# Patient Record
Sex: Female | Born: 1945 | Hispanic: No
Health system: Southern US, Community
[De-identification: ages and names within clinical notes are randomized; demographics above are authoritative.]

## PROBLEM LIST (undated history)

## (undated) DIAGNOSIS — L409 Psoriasis, unspecified: Secondary | ICD-10-CM

## (undated) DIAGNOSIS — M65331 Trigger finger, right middle finger: Secondary | ICD-10-CM

## (undated) DIAGNOSIS — Z91048 Other nonmedicinal substance allergy status: Secondary | ICD-10-CM

## (undated) DIAGNOSIS — E7800 Pure hypercholesterolemia, unspecified: Secondary | ICD-10-CM

---

## 1978-10-21 HISTORY — PX: TUBAL LIGATION: SHX77

## 2016-07-22 ENCOUNTER — Ambulatory Visit (INDEPENDENT_AMBULATORY_CARE_PROVIDER_SITE_OTHER): Payer: Medicare HMO | Admitting: Orthopaedic Surgery

## 2016-07-22 DIAGNOSIS — M65332 Trigger finger, left middle finger: Secondary | ICD-10-CM | POA: Diagnosis not present

## 2016-12-03 ENCOUNTER — Telehealth (INDEPENDENT_AMBULATORY_CARE_PROVIDER_SITE_OTHER): Payer: Self-pay | Admitting: Radiology

## 2016-12-03 NOTE — Telephone Encounter (Signed)
Patient left voicemail requesting CD of images.  CD has been made and placed up front.  Patient is aware CD is ready for pick up and states she has already filled out the release form.

## 2019-11-03 ENCOUNTER — Ambulatory Visit (INDEPENDENT_AMBULATORY_CARE_PROVIDER_SITE_OTHER): Payer: Medicare HMO | Admitting: Psychology

## 2019-11-03 DIAGNOSIS — F411 Generalized anxiety disorder: Secondary | ICD-10-CM

## 2019-11-17 ENCOUNTER — Ambulatory Visit (INDEPENDENT_AMBULATORY_CARE_PROVIDER_SITE_OTHER): Payer: Medicare HMO | Admitting: Psychology

## 2019-11-17 DIAGNOSIS — F411 Generalized anxiety disorder: Secondary | ICD-10-CM

## 2019-11-29 ENCOUNTER — Ambulatory Visit: Payer: Medicare HMO | Attending: Internal Medicine

## 2019-11-29 ENCOUNTER — Ambulatory Visit (INDEPENDENT_AMBULATORY_CARE_PROVIDER_SITE_OTHER): Payer: Medicare HMO | Admitting: Psychology

## 2019-11-29 DIAGNOSIS — Z23 Encounter for immunization: Secondary | ICD-10-CM | POA: Insufficient documentation

## 2019-11-29 DIAGNOSIS — F411 Generalized anxiety disorder: Secondary | ICD-10-CM

## 2019-11-29 NOTE — Progress Notes (Signed)
   Covid-19 Vaccination Clinic  Name:  Jordan Collier    MRN: [id number] DOB: 01-Oct-1946  11/29/2019  Jordan Collier was observed post Covid-19 immunization for 15 minutes without incidence. She was provided with Vaccine Information Sheet and instruction to access the V-Safe system.   Jordan Collier was instructed to call 911 with any severe reactions post vaccine: Marland Kitchen Difficulty breathing  . Swelling of your face and throat  . A fast heartbeat  . A bad rash all over your body  . Dizziness and weakness    Immunizations Administered    Name Date Dose VIS Date Route   Pfizer COVID-19 Vaccine 11/29/2019  5:07 PM 0.3 mL 10/01/2019 Intramuscular   Manufacturer: [place]   Lot: [id number]   [place]: [id number]

## 2019-12-01 ENCOUNTER — Ambulatory Visit: Payer: Medicare HMO | Admitting: Psychology

## 2019-12-24 ENCOUNTER — Ambulatory Visit: Payer: Medicare HMO | Attending: Internal Medicine

## 2019-12-24 DIAGNOSIS — Z23 Encounter for immunization: Secondary | ICD-10-CM

## 2019-12-24 NOTE — Progress Notes (Signed)
   Covid-19 Vaccination Clinic  Name:  Sheree Nagi    MRN: [id number] DOB: 06-10-1946  12/24/2019  Ms. Vallin was observed post Covid-19 immunization for 15 minutes without incident. She was provided with Vaccine Information Sheet and instruction to access the V-Safe system.   Ms. Reinholtz was instructed to call 911 with any severe reactions post vaccine: Marland Kitchen Difficulty breathing  . Swelling of face and throat  . A fast heartbeat  . A bad rash all over body  . Dizziness and weakness   Immunizations Administered    Name Date Dose VIS Date Route   Pfizer COVID-19 Vaccine 12/24/2019  3:06 PM 0.3 mL 10/01/2019 Intramuscular   Manufacturer: [place]   Lot: [id number]   [place]: [id number]

## 2020-01-05 ENCOUNTER — Ambulatory Visit (INDEPENDENT_AMBULATORY_CARE_PROVIDER_SITE_OTHER): Payer: Medicare HMO | Admitting: Psychology

## 2020-01-05 DIAGNOSIS — F411 Generalized anxiety disorder: Secondary | ICD-10-CM

## 2020-01-19 ENCOUNTER — Ambulatory Visit (INDEPENDENT_AMBULATORY_CARE_PROVIDER_SITE_OTHER): Payer: Medicare HMO | Admitting: Psychology

## 2020-01-19 DIAGNOSIS — F411 Generalized anxiety disorder: Secondary | ICD-10-CM | POA: Diagnosis not present

## 2020-02-01 ENCOUNTER — Ambulatory Visit (INDEPENDENT_AMBULATORY_CARE_PROVIDER_SITE_OTHER): Payer: Medicare HMO | Admitting: Psychology

## 2020-02-01 DIAGNOSIS — F411 Generalized anxiety disorder: Secondary | ICD-10-CM | POA: Diagnosis not present

## 2020-02-17 ENCOUNTER — Ambulatory Visit: Payer: Medicare HMO | Admitting: Psychology

## 2020-09-13 ENCOUNTER — Other Ambulatory Visit: Payer: Self-pay | Admitting: Internal Medicine

## 2020-09-13 DIAGNOSIS — Z1231 Encounter for screening mammogram for malignant neoplasm of breast: Secondary | ICD-10-CM

## 2020-11-13 ENCOUNTER — Ambulatory Visit: Payer: Medicare HMO

## 2021-01-22 ENCOUNTER — Ambulatory Visit
Admission: RE | Admit: 2021-01-22 | Discharge: 2021-01-22 | Disposition: A | Discharge status: Home / Self Care | Payer: Medicare HMO | Source: Ambulatory Visit | Attending: Internal Medicine | Admitting: Internal Medicine

## 2021-01-22 ENCOUNTER — Other Ambulatory Visit: Payer: Self-pay

## 2021-01-22 DIAGNOSIS — Z1231 Encounter for screening mammogram for malignant neoplasm of breast: Secondary | ICD-10-CM | POA: Diagnosis not present

## 2021-03-05 DIAGNOSIS — Z1331 Encounter for screening for depression: Secondary | ICD-10-CM | POA: Diagnosis not present

## 2021-03-05 DIAGNOSIS — E7800 Pure hypercholesterolemia, unspecified: Secondary | ICD-10-CM | POA: Diagnosis not present

## 2021-03-05 DIAGNOSIS — R69 Illness, unspecified: Secondary | ICD-10-CM | POA: Diagnosis not present

## 2021-03-05 DIAGNOSIS — Z1339 Encounter for screening examination for other mental health and behavioral disorders: Secondary | ICD-10-CM | POA: Diagnosis not present

## 2021-03-05 DIAGNOSIS — L409 Psoriasis, unspecified: Secondary | ICD-10-CM | POA: Diagnosis not present

## 2021-03-05 DIAGNOSIS — R7301 Impaired fasting glucose: Secondary | ICD-10-CM | POA: Diagnosis not present

## 2021-03-05 DIAGNOSIS — K056 Periodontal disease, unspecified: Secondary | ICD-10-CM | POA: Diagnosis not present

## 2021-03-05 DIAGNOSIS — F4320 Adjustment disorder, unspecified: Secondary | ICD-10-CM | POA: Diagnosis not present

## 2021-03-05 DIAGNOSIS — Z86010 Personal history of colonic polyps: Secondary | ICD-10-CM | POA: Diagnosis not present

## 2021-03-05 DIAGNOSIS — L439 Lichen planus, unspecified: Secondary | ICD-10-CM | POA: Diagnosis not present

## 2021-03-05 DIAGNOSIS — I7300 Raynaud's syndrome without gangrene: Secondary | ICD-10-CM | POA: Diagnosis not present

## 2021-03-12 DIAGNOSIS — H5203 Hypermetropia, bilateral: Secondary | ICD-10-CM | POA: Diagnosis not present

## 2021-07-12 DIAGNOSIS — Z86010 Personal history of colonic polyps: Secondary | ICD-10-CM | POA: Diagnosis not present

## 2021-07-12 DIAGNOSIS — K5730 Diverticulosis of large intestine without perforation or abscess without bleeding: Secondary | ICD-10-CM | POA: Diagnosis not present

## 2021-07-12 DIAGNOSIS — D122 Benign neoplasm of ascending colon: Secondary | ICD-10-CM | POA: Diagnosis not present

## 2021-07-12 DIAGNOSIS — K648 Other hemorrhoids: Secondary | ICD-10-CM | POA: Diagnosis not present

## 2021-07-16 DIAGNOSIS — Z0100 Encounter for examination of eyes and vision without abnormal findings: Secondary | ICD-10-CM | POA: Diagnosis not present

## 2021-07-17 DIAGNOSIS — D122 Benign neoplasm of ascending colon: Secondary | ICD-10-CM | POA: Diagnosis not present

## 2021-08-20 DIAGNOSIS — Z111 Encounter for screening for respiratory tuberculosis: Secondary | ICD-10-CM | POA: Diagnosis not present

## 2021-08-22 DIAGNOSIS — Z111 Encounter for screening for respiratory tuberculosis: Secondary | ICD-10-CM | POA: Diagnosis not present

## 2021-08-27 DIAGNOSIS — E785 Hyperlipidemia, unspecified: Secondary | ICD-10-CM | POA: Diagnosis not present

## 2021-08-27 DIAGNOSIS — R7301 Impaired fasting glucose: Secondary | ICD-10-CM | POA: Diagnosis not present

## 2021-09-03 DIAGNOSIS — Z86010 Personal history of colonic polyps: Secondary | ICD-10-CM | POA: Diagnosis not present

## 2021-09-03 DIAGNOSIS — R7301 Impaired fasting glucose: Secondary | ICD-10-CM | POA: Diagnosis not present

## 2021-09-03 DIAGNOSIS — I7300 Raynaud's syndrome without gangrene: Secondary | ICD-10-CM | POA: Diagnosis not present

## 2021-09-03 DIAGNOSIS — R82998 Other abnormal findings in urine: Secondary | ICD-10-CM | POA: Diagnosis not present

## 2021-09-03 DIAGNOSIS — R69 Illness, unspecified: Secondary | ICD-10-CM | POA: Diagnosis not present

## 2021-09-03 DIAGNOSIS — E7800 Pure hypercholesterolemia, unspecified: Secondary | ICD-10-CM | POA: Diagnosis not present

## 2021-09-03 DIAGNOSIS — E871 Hypo-osmolality and hyponatremia: Secondary | ICD-10-CM | POA: Diagnosis not present

## 2021-09-03 DIAGNOSIS — Z1331 Encounter for screening for depression: Secondary | ICD-10-CM | POA: Diagnosis not present

## 2021-09-03 DIAGNOSIS — Z0000 Encounter for general adult medical examination without abnormal findings: Secondary | ICD-10-CM | POA: Diagnosis not present

## 2021-09-03 DIAGNOSIS — Z1339 Encounter for screening examination for other mental health and behavioral disorders: Secondary | ICD-10-CM | POA: Diagnosis not present

## 2021-09-03 DIAGNOSIS — F4320 Adjustment disorder, unspecified: Secondary | ICD-10-CM | POA: Diagnosis not present

## 2021-09-03 DIAGNOSIS — L439 Lichen planus, unspecified: Secondary | ICD-10-CM | POA: Diagnosis not present

## 2021-11-15 DIAGNOSIS — R0989 Other specified symptoms and signs involving the circulatory and respiratory systems: Secondary | ICD-10-CM | POA: Diagnosis not present

## 2021-11-15 DIAGNOSIS — F430 Acute stress reaction: Secondary | ICD-10-CM | POA: Diagnosis not present

## 2021-11-15 DIAGNOSIS — R69 Illness, unspecified: Secondary | ICD-10-CM | POA: Diagnosis not present

## 2022-02-14 DIAGNOSIS — M722 Plantar fascial fibromatosis: Secondary | ICD-10-CM | POA: Diagnosis not present

## 2022-02-14 DIAGNOSIS — M7661 Achilles tendinitis, right leg: Secondary | ICD-10-CM | POA: Diagnosis not present

## 2022-02-14 DIAGNOSIS — M6702 Short Achilles tendon (acquired), left ankle: Secondary | ICD-10-CM | POA: Diagnosis not present

## 2022-02-14 DIAGNOSIS — M7671 Peroneal tendinitis, right leg: Secondary | ICD-10-CM | POA: Diagnosis not present

## 2022-02-14 DIAGNOSIS — M6701 Short Achilles tendon (acquired), right ankle: Secondary | ICD-10-CM | POA: Diagnosis not present

## 2022-03-04 DIAGNOSIS — R7301 Impaired fasting glucose: Secondary | ICD-10-CM | POA: Diagnosis not present

## 2022-03-04 DIAGNOSIS — L439 Lichen planus, unspecified: Secondary | ICD-10-CM | POA: Diagnosis not present

## 2022-03-04 DIAGNOSIS — K056 Periodontal disease, unspecified: Secondary | ICD-10-CM | POA: Diagnosis not present

## 2022-03-04 DIAGNOSIS — L409 Psoriasis, unspecified: Secondary | ICD-10-CM | POA: Diagnosis not present

## 2022-03-04 DIAGNOSIS — R69 Illness, unspecified: Secondary | ICD-10-CM | POA: Diagnosis not present

## 2022-03-04 DIAGNOSIS — Z1331 Encounter for screening for depression: Secondary | ICD-10-CM | POA: Diagnosis not present

## 2022-03-04 DIAGNOSIS — R0989 Other specified symptoms and signs involving the circulatory and respiratory systems: Secondary | ICD-10-CM | POA: Diagnosis not present

## 2022-03-04 DIAGNOSIS — Z1339 Encounter for screening examination for other mental health and behavioral disorders: Secondary | ICD-10-CM | POA: Diagnosis not present

## 2022-03-04 DIAGNOSIS — I7300 Raynaud's syndrome without gangrene: Secondary | ICD-10-CM | POA: Diagnosis not present

## 2022-03-04 DIAGNOSIS — E7800 Pure hypercholesterolemia, unspecified: Secondary | ICD-10-CM | POA: Diagnosis not present

## 2022-03-04 DIAGNOSIS — F430 Acute stress reaction: Secondary | ICD-10-CM | POA: Diagnosis not present

## 2022-03-04 DIAGNOSIS — F4320 Adjustment disorder, unspecified: Secondary | ICD-10-CM | POA: Diagnosis not present

## 2022-04-01 DIAGNOSIS — H5203 Hypermetropia, bilateral: Secondary | ICD-10-CM | POA: Diagnosis not present

## 2022-04-02 DIAGNOSIS — M722 Plantar fascial fibromatosis: Secondary | ICD-10-CM | POA: Diagnosis not present

## 2022-05-16 DIAGNOSIS — M722 Plantar fascial fibromatosis: Secondary | ICD-10-CM | POA: Diagnosis not present

## 2022-05-16 DIAGNOSIS — M6702 Short Achilles tendon (acquired), left ankle: Secondary | ICD-10-CM | POA: Diagnosis not present

## 2022-05-16 DIAGNOSIS — M6701 Short Achilles tendon (acquired), right ankle: Secondary | ICD-10-CM | POA: Diagnosis not present

## 2022-05-16 DIAGNOSIS — M7671 Peroneal tendinitis, right leg: Secondary | ICD-10-CM | POA: Diagnosis not present

## 2022-05-16 DIAGNOSIS — M7661 Achilles tendinitis, right leg: Secondary | ICD-10-CM | POA: Diagnosis not present

## 2022-08-15 ENCOUNTER — Ambulatory Visit: Payer: Medicare HMO | Admitting: Podiatry

## 2022-08-15 ENCOUNTER — Ambulatory Visit (INDEPENDENT_AMBULATORY_CARE_PROVIDER_SITE_OTHER): Payer: Medicare HMO

## 2022-08-15 DIAGNOSIS — M79672 Pain in left foot: Secondary | ICD-10-CM | POA: Diagnosis not present

## 2022-08-15 DIAGNOSIS — M9261 Juvenile osteochondrosis of tarsus, right ankle: Secondary | ICD-10-CM

## 2022-08-15 DIAGNOSIS — M7661 Achilles tendinitis, right leg: Secondary | ICD-10-CM

## 2022-08-15 DIAGNOSIS — M79671 Pain in right foot: Secondary | ICD-10-CM

## 2022-08-15 DIAGNOSIS — M19079 Primary osteoarthritis, unspecified ankle and foot: Secondary | ICD-10-CM

## 2022-08-15 DIAGNOSIS — M779 Enthesopathy, unspecified: Secondary | ICD-10-CM

## 2022-08-15 NOTE — Patient Instructions (Addendum)
For sandals I like oofos and vionic  ---   Plantar Fasciitis (Heel Spur Syndrome) with Rehab The plantar fascia is a fibrous, ligament-like, soft-tissue structure that spans the bottom of the foot. Plantar fasciitis is a condition that causes pain in the foot due to inflammation of the tissue. SYMPTOMS  Pain and tenderness on the underneath side of the foot. Pain that worsens with standing or walking. CAUSES  Plantar fasciitis is caused by irritation and injury to the plantar fascia on the underneath side of the foot. Common mechanisms of injury include: Direct trauma to bottom of the foot. Damage to a small nerve that runs under the foot where the main fascia attaches to the heel bone. Stress placed on the plantar fascia due to bone spurs. RISK INCREASES WITH:  Activities that place stress on the plantar fascia (running, jumping, pivoting, or cutting). Poor strength and flexibility. Improperly fitted shoes. Tight calf muscles. Flat feet. Failure to warm-up properly before activity. Obesity. PREVENTION Warm up and stretch properly before activity. Allow for adequate recovery between workouts. Maintain physical fitness: Strength, flexibility, and endurance. Cardiovascular fitness. Maintain a health body weight. Avoid stress on the plantar fascia. Wear properly fitted shoes, including arch supports for individuals who have flat feet.  PROGNOSIS  If treated properly, then the symptoms of plantar fasciitis usually resolve without surgery. However, occasionally surgery is necessary.  RELATED COMPLICATIONS  Recurrent symptoms that may result in a chronic condition. Problems of the lower back that are caused by compensating for the injury, such as limping. Pain or weakness of the foot during push-off following surgery. Chronic inflammation, scarring, and partial or complete fascia tear, occurring more often from repeated injections.  TREATMENT  Treatment initially involves the use  of ice and medication to help reduce pain and inflammation. The use of strengthening and stretching exercises may help reduce pain with activity, especially stretches of the Achilles tendon. These exercises may be performed at home or with a therapist. Your caregiver may recommend that you use heel cups of arch supports to help reduce stress on the plantar fascia. Occasionally, corticosteroid injections are given to reduce inflammation. If symptoms persist for greater than 6 months despite non-surgical (conservative), then surgery may be recommended.   MEDICATION  If pain medication is necessary, then nonsteroidal anti-inflammatory medications, such as aspirin and ibuprofen, or other minor pain relievers, such as acetaminophen, are often recommended. Do not take pain medication within 7 days before surgery. Prescription pain relievers may be given if deemed necessary by your caregiver. Use only as directed and only as much as you need. Corticosteroid injections may be given by your caregiver. These injections should be reserved for the most serious cases, because they may only be given a certain number of times.  HEAT AND COLD Cold treatment (icing) relieves pain and reduces inflammation. Cold treatment should be applied for 10 to 15 minutes every 2 to 3 hours for inflammation and pain and immediately after any activity that aggravates your symptoms. Use ice packs or massage the area with a piece of ice (ice massage). Heat treatment may be used prior to performing the stretching and strengthening activities prescribed by your caregiver, physical therapist, or athletic trainer. Use a heat pack or soak the injury in warm water.  SEEK IMMEDIATE MEDICAL CARE IF: Treatment seems to offer no benefit, or the condition worsens. Any medications produce adverse side effects.  EXERCISES- RANGE OF MOTION (ROM) AND STRETCHING EXERCISES - Plantar Fasciitis (Heel Spur Syndrome) These exercises  may help you when  beginning to rehabilitate your injury. Your symptoms may resolve with or without further involvement from your physician, physical therapist or athletic trainer. While completing these exercises, remember:  Restoring tissue flexibility helps normal motion to return to the joints. This allows healthier, less painful movement and activity. An effective stretch should be held for at least 30 seconds. A stretch should never be painful. You should only feel a gentle lengthening or release in the stretched tissue.  RANGE OF MOTION - Toe Extension, Flexion Sit with your right / left leg crossed over your opposite knee. Grasp your toes and gently pull them back toward the top of your foot. You should feel a stretch on the bottom of your toes and/or foot. Hold this stretch for 10 seconds. Now, gently pull your toes toward the bottom of your foot. You should feel a stretch on the top of your toes and or foot. Hold this stretch for 10 seconds. Repeat  times. Complete this stretch 3 times per day.   RANGE OF MOTION - Ankle Dorsiflexion, Active Assisted Remove shoes and sit on a chair that is preferably not on a carpeted surface. Place right / left foot under knee. Extend your opposite leg for support. Keeping your heel down, slide your right / left foot back toward the chair until you feel a stretch at your ankle or calf. If you do not feel a stretch, slide your bottom forward to the edge of the chair, while still keeping your heel down. Hold this stretch for 10 seconds. Repeat 3 times. Complete this stretch 2 times per day.   STRETCH  Gastroc, Standing Place hands on wall. Extend right / left leg, keeping the front knee somewhat bent. Slightly point your toes inward on your back foot. Keeping your right / left heel on the floor and your knee straight, shift your weight toward the wall, not allowing your back to arch. You should feel a gentle stretch in the right / left calf. Hold this position for 10  seconds. Repeat 3 times. Complete this stretch 2 times per day.  STRETCH  Soleus, Standing Place hands on wall. Extend right / left leg, keeping the other knee somewhat bent. Slightly point your toes inward on your back foot. Keep your right / left heel on the floor, bend your back knee, and slightly shift your weight over the back leg so that you feel a gentle stretch deep in your back calf. Hold this position for 10 seconds. Repeat 3 times. Complete this stretch 2 times per day.  STRETCH  Gastrocsoleus, Standing  Note: This exercise can place a lot of stress on your foot and ankle. Please complete this exercise only if specifically instructed by your caregiver.  Place the ball of your right / left foot on a step, keeping your other foot firmly on the same step. Hold on to the wall or a rail for balance. Slowly lift your other foot, allowing your body weight to press your heel down over the edge of the step. You should feel a stretch in your right / left calf. Hold this position for 10 seconds. Repeat this exercise with a slight bend in your right / left knee. Repeat 3 times. Complete this stretch 2 times per day.   STRENGTHENING EXERCISES - Plantar Fasciitis (Heel Spur Syndrome)  These exercises may help you when beginning to rehabilitate your injury. They may resolve your symptoms with or without further involvement from your physician, physical therapist  or Product/process development scientist. While completing these exercises, remember:  Muscles can gain both the endurance and the strength needed for everyday activities through controlled exercises. Complete these exercises as instructed by your physician, physical therapist or athletic trainer. Progress the resistance and repetitions only as guided.  STRENGTH - Towel Curls Sit in a chair positioned on a non-carpeted surface. Place your foot on a towel, keeping your heel on the floor. Pull the towel toward your heel by only curling your toes. Keep your  heel on the floor. Repeat 3 times. Complete this exercise 2 times per day.  STRENGTH - Ankle Inversion Secure one end of a rubber exercise band/tubing to a fixed object (table, pole). Loop the other end around your foot just before your toes. Place your fists between your knees. This will focus your strengthening at your ankle. Slowly, pull your big toe up and in, making sure the band/tubing is positioned to resist the entire motion. Hold this position for 10 seconds. Have your muscles resist the band/tubing as it slowly pulls your foot back to the starting position. Repeat 3 times. Complete this exercises 2 times per day.  Document Released: 10/07/2005 Document Revised: 12/30/2011 Document Reviewed: 01/19/2009 [facility] Patient Information 2014 [place], [place].

## 2022-08-15 NOTE — Progress Notes (Signed)
Subjective:   Patient ID: Jordan Collier, female   DOB: 76 y.o.   MRN: [id number]   HPI Chief Complaint  Patient presents with   Foot Pain    right achilles tendon pain, in the heel and on the lateral side of the foot, patient is having some swelling, Started  7 months ago, Rate of pain 8 out of 10 at night time, TX: freeze rub with hemp, inserts, changing shoes, LEFT FOOT BUNION    76 year old female presents the above concerns.  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 She was at [employer] and she went to put something on the shelf and she had some pain from the bottom of the heel going to the back of the heel.  At that time she did not feel any popping sensations or any injury.  She states the pain is variable. Was getting more pain at night with a pain level of 8/10 and 2/10 during the day. She has inserts and asking for new ones. She has 3 pairs of inserts and 4 pairs of she she rotates. She is asking about shoe recommends for at home.   The more constant pain has been about 7 months but it has been going on longer.   She has a bunion on the left foot as well that she is asking about but causing significant pain.   Review of Systems  All other systems reviewed and are negative.  No past medical history on file.   Current Outpatient Medications:    cetirizine (ZYRTEC) 10 MG tablet, 1 tablet, Disp: , Rfl:    Cholecalciferol (KP VITAMIN D) 25 MCG (1000 UT) capsule, Take by mouth., Disp: , Rfl:    Cyanocobalamin 1000 MCG/15ML LIQD, See admin instructions., Disp: , Rfl:    guaiFENesin (MUCINEX) 600 MG 12 hr tablet, Take by mouth., Disp: , Rfl:    sodium chloride (OCEAN) 0.65 % nasal spray, 2 sprays in each nostril as needed, Disp: , Rfl:    valACYclovir (VALTREX) 1000 MG tablet, Take by mouth., Disp: , Rfl:   Allergies  Allergen Reactions   Dexamethasone Rash   Molds & Smuts Other (See Comments)    Mucus over production   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 Other (See Comments)     Mucus over production   Penicillins     Other reaction(s): Unknown   Sulfa Antibiotics     Other reaction(s): Unknown          Objective:  Physical Exam  General: AAO x3, NAD  Dermatological: Skin is warm, dry and supple bilateral.  There are no open sores, no preulcerative lesions, no rash or signs of infection present.  Vascular: Dorsalis Pedis artery and Posterior Tibial artery pedal pulses are 2/4 bilateral with immedate capillary fill time.  There is no pain with calf compression, swelling, warmth, erythema.   Neruologic: Grossly intact via light touch bilateral.  Negative Tinel sign.  Musculoskeletal: Tenderness palpation of the posterior aspect the calcaneal insertion of the Achilles tendon but also to the bottom of the heel as well.  There is no pain with lateral compression of the calcaneus.  There is no edema.  Equinus is present.  Flexor, extensor tendons appear to be intact bilaterally.  Decreased range of motion of the left first MPJ and there is crepitation with range of motion.  Gait: Unassisted, Nonantalgic.       Assessment:   76 year old female with right-sided heel pain, first MPJ arthritis     Plan:  -  Treatment options discussed including all alternatives, risks, and complications -Etiology of symptoms were discussed -X-rays were obtained and reviewed with the patient.  3 views bilateral feet and 2 views of the right ankle were obtained.  There is no evidence of acute fracture.  Haglund's deformity is noted.  Arthritic changes present of the left first MPJ. -Regards to the right side we discussed continued stretching, icing on a regular basis.  Discussed shoes and good arch supports.  Dispensed a pair of power steps were dispensed and added to the lateral post to mimic her other inserts.  Discussed Voltaren gel as well. -Regards to the left side we discussed using good arch support as well as wearing a stiffer soled shoe.  Consider injection if needed but  she can also use Voltaren gel.  Trula Slade DPM

## 2022-08-19 DIAGNOSIS — Z0100 Encounter for examination of eyes and vision without abnormal findings: Secondary | ICD-10-CM | POA: Diagnosis not present

## 2022-08-22 ENCOUNTER — Other Ambulatory Visit: Payer: Self-pay | Admitting: Podiatry

## 2022-08-22 DIAGNOSIS — M9261 Juvenile osteochondrosis of tarsus, right ankle: Secondary | ICD-10-CM

## 2022-08-22 DIAGNOSIS — M79671 Pain in right foot: Secondary | ICD-10-CM

## 2022-08-22 DIAGNOSIS — M7661 Achilles tendinitis, right leg: Secondary | ICD-10-CM

## 2022-08-22 DIAGNOSIS — M19079 Primary osteoarthritis, unspecified ankle and foot: Secondary | ICD-10-CM

## 2022-08-26 DIAGNOSIS — H0011 Chalazion right upper eyelid: Secondary | ICD-10-CM | POA: Diagnosis not present

## 2022-09-05 DIAGNOSIS — Z961 Presence of intraocular lens: Secondary | ICD-10-CM | POA: Diagnosis not present

## 2022-09-05 DIAGNOSIS — H18513 Endothelial corneal dystrophy, bilateral: Secondary | ICD-10-CM | POA: Diagnosis not present

## 2022-09-05 DIAGNOSIS — H00021 Hordeolum internum right upper eyelid: Secondary | ICD-10-CM | POA: Diagnosis not present

## 2022-09-16 DIAGNOSIS — R7301 Impaired fasting glucose: Secondary | ICD-10-CM | POA: Diagnosis not present

## 2022-09-16 DIAGNOSIS — R7989 Other specified abnormal findings of blood chemistry: Secondary | ICD-10-CM | POA: Diagnosis not present

## 2022-09-16 DIAGNOSIS — E7800 Pure hypercholesterolemia, unspecified: Secondary | ICD-10-CM | POA: Diagnosis not present

## 2022-09-19 DIAGNOSIS — H00021 Hordeolum internum right upper eyelid: Secondary | ICD-10-CM | POA: Diagnosis not present

## 2022-09-19 DIAGNOSIS — H18513 Endothelial corneal dystrophy, bilateral: Secondary | ICD-10-CM | POA: Diagnosis not present

## 2022-09-19 DIAGNOSIS — Z961 Presence of intraocular lens: Secondary | ICD-10-CM | POA: Diagnosis not present

## 2022-09-23 DIAGNOSIS — Z1331 Encounter for screening for depression: Secondary | ICD-10-CM | POA: Diagnosis not present

## 2022-09-23 DIAGNOSIS — R0989 Other specified symptoms and signs involving the circulatory and respiratory systems: Secondary | ICD-10-CM | POA: Diagnosis not present

## 2022-09-23 DIAGNOSIS — Z1212 Encounter for screening for malignant neoplasm of rectum: Secondary | ICD-10-CM | POA: Diagnosis not present

## 2022-09-23 DIAGNOSIS — L439 Lichen planus, unspecified: Secondary | ICD-10-CM | POA: Diagnosis not present

## 2022-09-23 DIAGNOSIS — R69 Illness, unspecified: Secondary | ICD-10-CM | POA: Diagnosis not present

## 2022-09-23 DIAGNOSIS — I7300 Raynaud's syndrome without gangrene: Secondary | ICD-10-CM | POA: Diagnosis not present

## 2022-09-23 DIAGNOSIS — F430 Acute stress reaction: Secondary | ICD-10-CM | POA: Diagnosis not present

## 2022-09-23 DIAGNOSIS — R82998 Other abnormal findings in urine: Secondary | ICD-10-CM | POA: Diagnosis not present

## 2022-09-23 DIAGNOSIS — R7301 Impaired fasting glucose: Secondary | ICD-10-CM | POA: Diagnosis not present

## 2022-09-23 DIAGNOSIS — K056 Periodontal disease, unspecified: Secondary | ICD-10-CM | POA: Diagnosis not present

## 2022-09-23 DIAGNOSIS — Z86010 Personal history of colonic polyps: Secondary | ICD-10-CM | POA: Diagnosis not present

## 2022-09-23 DIAGNOSIS — E7800 Pure hypercholesterolemia, unspecified: Secondary | ICD-10-CM | POA: Diagnosis not present

## 2022-09-23 DIAGNOSIS — Z0000 Encounter for general adult medical examination without abnormal findings: Secondary | ICD-10-CM | POA: Diagnosis not present

## 2022-09-23 DIAGNOSIS — Z1339 Encounter for screening examination for other mental health and behavioral disorders: Secondary | ICD-10-CM | POA: Diagnosis not present

## 2022-09-23 DIAGNOSIS — H269 Unspecified cataract: Secondary | ICD-10-CM | POA: Diagnosis not present

## 2022-10-04 DIAGNOSIS — L258 Unspecified contact dermatitis due to other agents: Secondary | ICD-10-CM | POA: Diagnosis not present

## 2022-10-07 DIAGNOSIS — M65341 Trigger finger, right ring finger: Secondary | ICD-10-CM | POA: Diagnosis not present

## 2022-10-07 DIAGNOSIS — R52 Pain, unspecified: Secondary | ICD-10-CM | POA: Diagnosis not present

## 2022-10-07 DIAGNOSIS — M65849 Other synovitis and tenosynovitis, unspecified hand: Secondary | ICD-10-CM | POA: Diagnosis not present

## 2022-10-07 DIAGNOSIS — M65331 Trigger finger, right middle finger: Secondary | ICD-10-CM | POA: Diagnosis not present

## 2022-11-04 DIAGNOSIS — M65341 Trigger finger, right ring finger: Secondary | ICD-10-CM | POA: Diagnosis not present

## 2022-11-04 DIAGNOSIS — M65849 Other synovitis and tenosynovitis, unspecified hand: Secondary | ICD-10-CM | POA: Diagnosis not present

## 2022-11-04 DIAGNOSIS — M65331 Trigger finger, right middle finger: Secondary | ICD-10-CM | POA: Diagnosis not present

## 2022-11-04 DIAGNOSIS — R52 Pain, unspecified: Secondary | ICD-10-CM | POA: Diagnosis not present

## 2022-11-08 IMAGING — MG MM DIGITAL SCREENING BILAT W/ TOMO AND CAD
8 series · 9 of 24 positions shown · non-contrast
Comparison: Previous exam(s).

CLINICAL DATA: Screening.

EXAM:
DIGITAL SCREENING BILATERAL MAMMOGRAM WITH TOMOSYNTHESIS AND CAD
TECHNIQUE: Bilateral screening digital craniocaudal and mediolateral oblique
mammograms were obtained. Bilateral screening digital breast
tomosynthesis was performed. The images were evaluated with
computer-aided detection.

[R CC synth-2D]
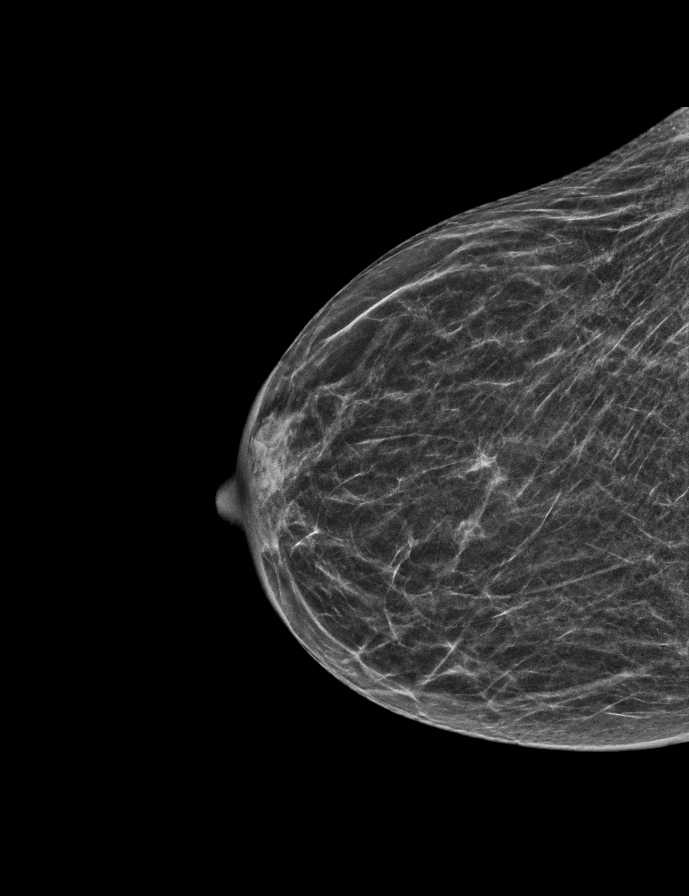

[L CC synth-2D]
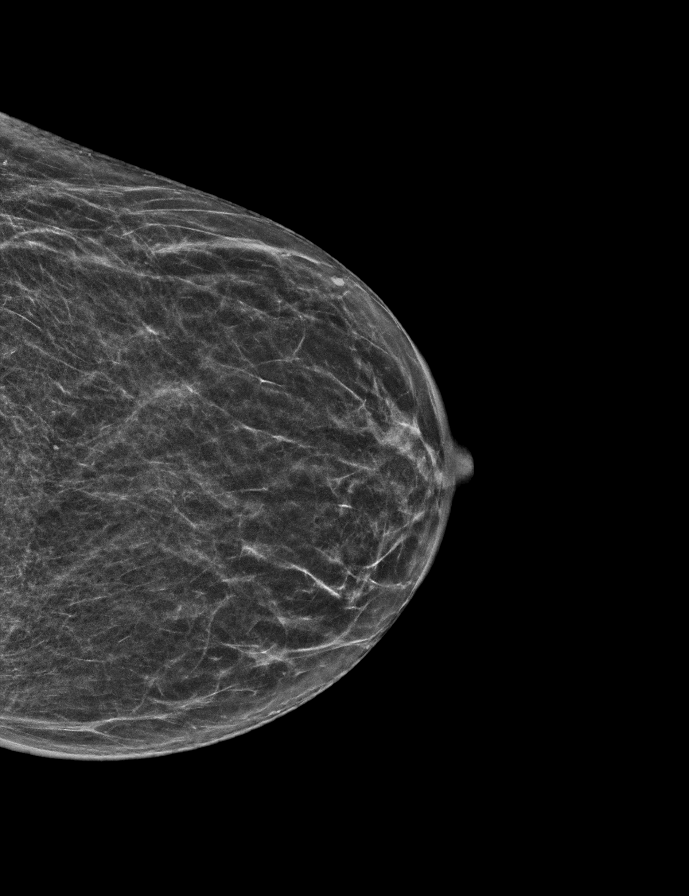

[L MLO synth-2D]
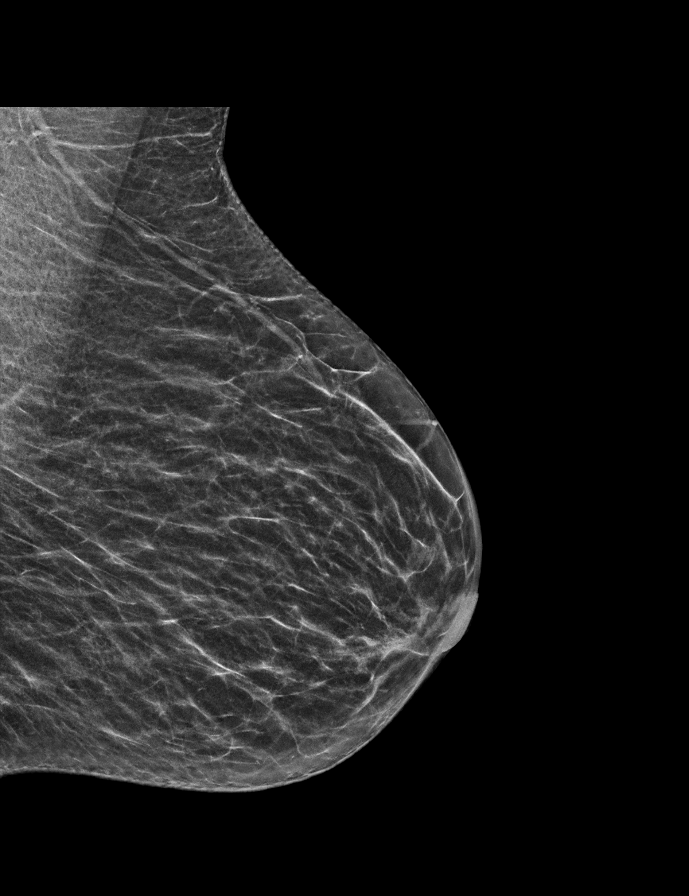

[R MLO synth-2D]
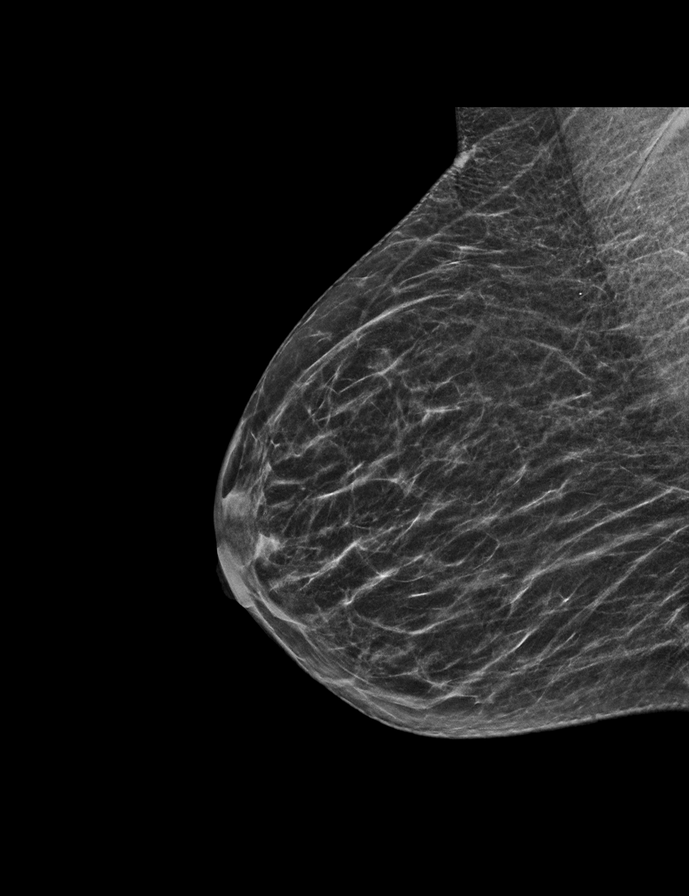

[L CC tomo · 2 of 41 frames shown]
[frame 14/41]
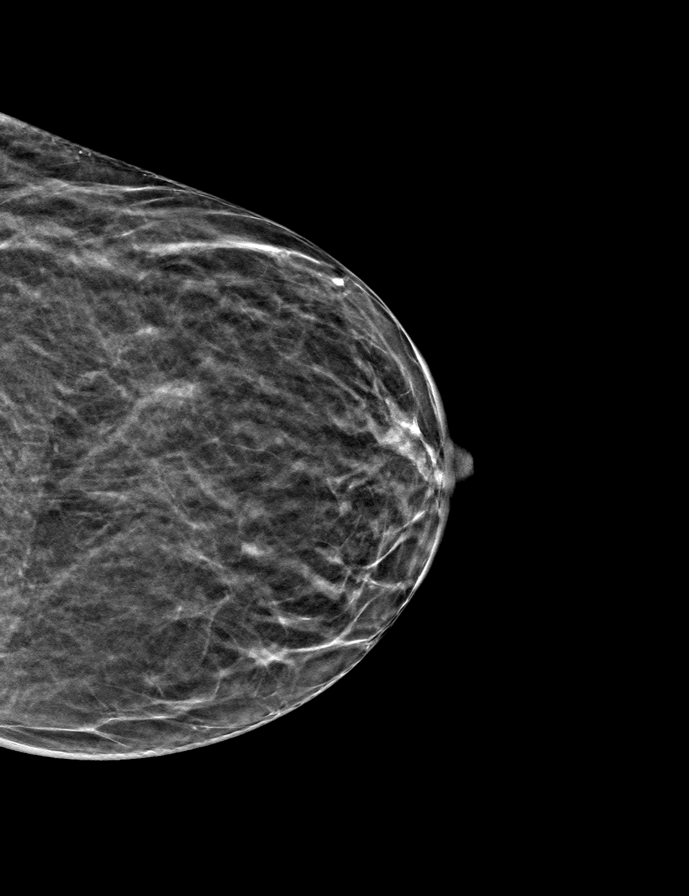
[frame 21/41]
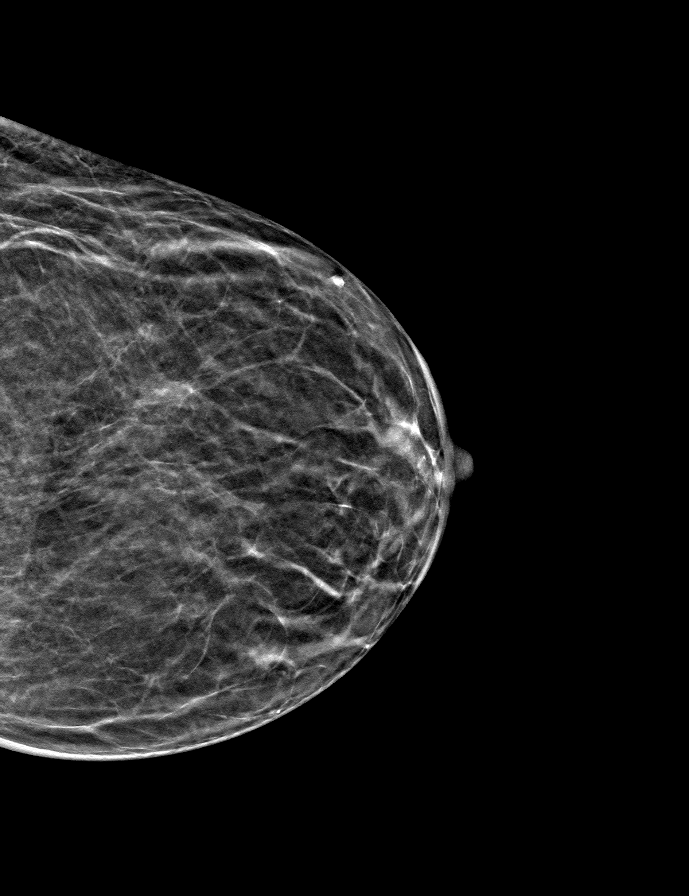

[L MLO tomo · tomo slice 21/42.0]
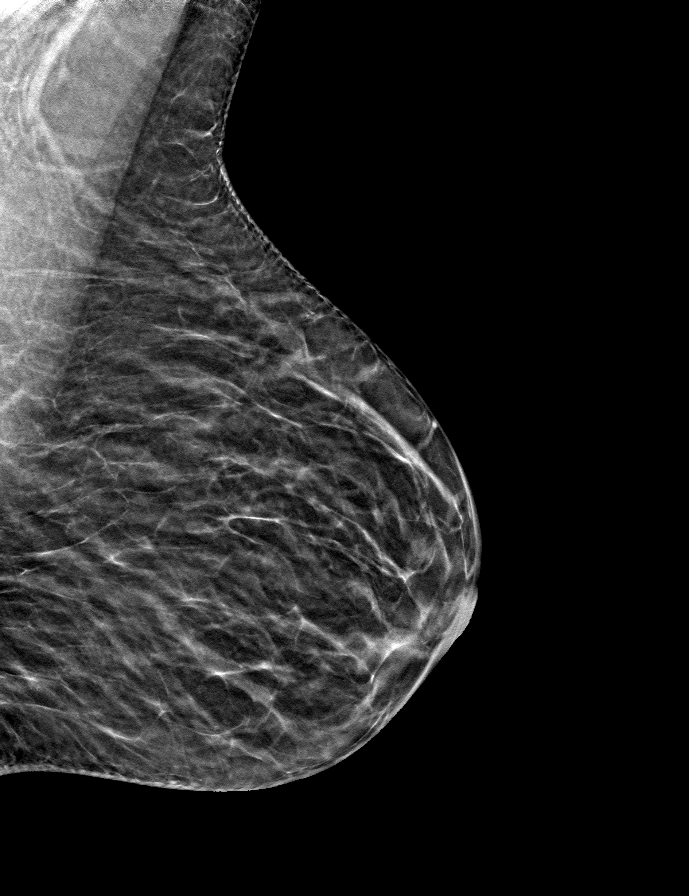

[R MLO tomo · tomo slice 21/40.0]
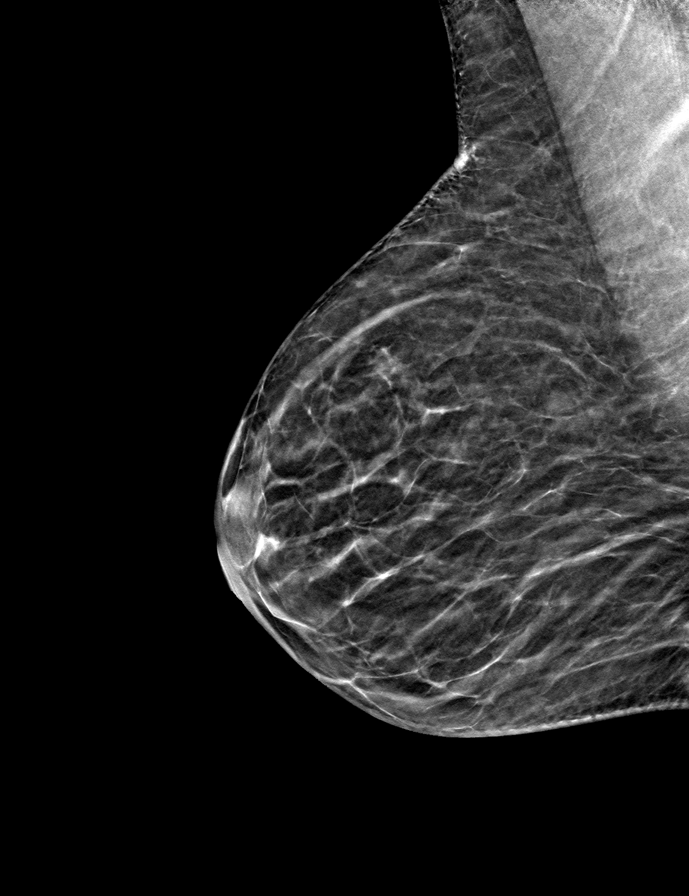

[R CC tomo · tomo slice 21/40.0]
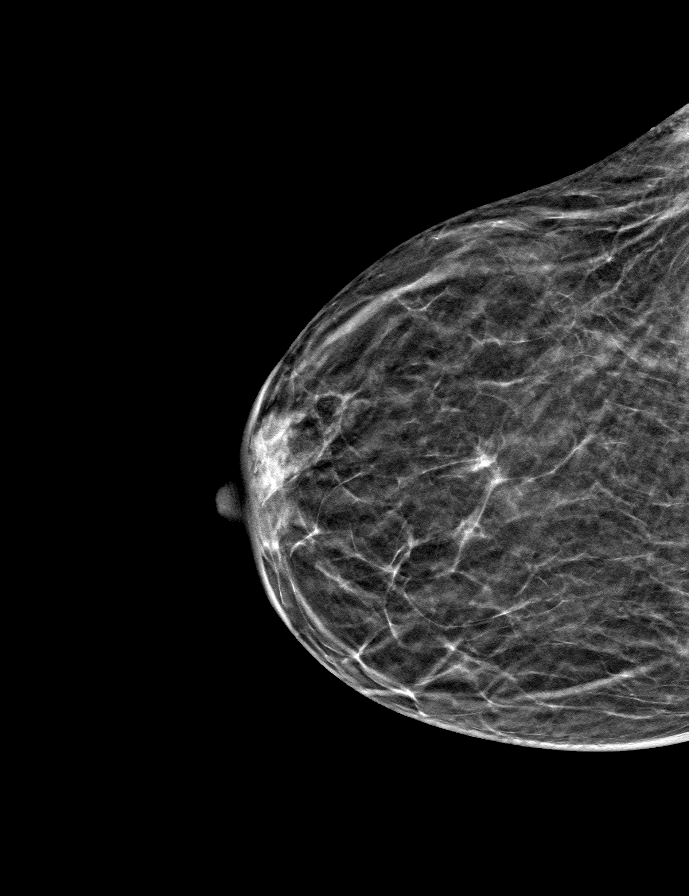

[9 of 24 positions shown; findings below may reference images not displayed]

ACR Breast Density Category b: There are scattered areas of
fibroglandular density.
FINDINGS: There are no findings suspicious for malignancy. The images were
evaluated with computer-aided detection.
IMPRESSION: No mammographic evidence of malignancy. A result letter of this
screening mammogram will be mailed directly to the patient.

RECOMMENDATION:
Screening mammogram in one year. (Code:WJ-I-BG6)

BI-RADS CATEGORY  1: Negative.

## 2022-11-18 DIAGNOSIS — L0291 Cutaneous abscess, unspecified: Secondary | ICD-10-CM | POA: Diagnosis not present

## 2022-11-22 DIAGNOSIS — L0291 Cutaneous abscess, unspecified: Secondary | ICD-10-CM | POA: Diagnosis not present

## 2022-11-28 DIAGNOSIS — L239 Allergic contact dermatitis, unspecified cause: Secondary | ICD-10-CM | POA: Diagnosis not present

## 2022-11-28 DIAGNOSIS — L0291 Cutaneous abscess, unspecified: Secondary | ICD-10-CM | POA: Diagnosis not present

## 2022-12-12 DIAGNOSIS — L239 Allergic contact dermatitis, unspecified cause: Secondary | ICD-10-CM | POA: Diagnosis not present

## 2022-12-12 DIAGNOSIS — L0291 Cutaneous abscess, unspecified: Secondary | ICD-10-CM | POA: Diagnosis not present

## 2023-01-20 HISTORY — PX: TRIGGER FINGER RELEASE: SHX641

## 2023-02-05 DIAGNOSIS — M65331 Trigger finger, right middle finger: Secondary | ICD-10-CM | POA: Diagnosis not present

## 2023-02-19 DIAGNOSIS — M25641 Stiffness of right hand, not elsewhere classified: Secondary | ICD-10-CM | POA: Diagnosis not present

## 2023-03-04 ENCOUNTER — Ambulatory Visit: Payer: Medicare HMO | Admitting: Podiatry

## 2023-03-04 DIAGNOSIS — M7661 Achilles tendinitis, right leg: Secondary | ICD-10-CM | POA: Diagnosis not present

## 2023-03-04 DIAGNOSIS — L600 Ingrowing nail: Secondary | ICD-10-CM | POA: Diagnosis not present

## 2023-03-04 DIAGNOSIS — R52 Pain, unspecified: Secondary | ICD-10-CM

## 2023-03-04 NOTE — Patient Instructions (Signed)

## 2023-03-04 NOTE — Progress Notes (Signed)
Subjective: Chief Complaint  Patient presents with   Ingrown Toenail    Right hallux possible ingrown seen by podiatrist at her home (assisted living) he trimmed the nail and wanted her to come to his office to have the ingrown removed but it was too far so she came here.     Foot Orthotics    Has powersteps and want a 2nd pair   77 year old female presents with above concerns, which are new.  She states that she has not come from her toenails where she lives but she keeps getting recurrent piece of toenail, corn on the right medial nail border and she wants to have this removed as she is getting it caught on something that keeps coming back despite debridement.  No swelling redness or drainage.  She is also requesting another pair of power steps inserts are getting worn out.  Objective: AAO x3, NAD DP/PT pulses palpable bilaterally, CRT less than 3 seconds Incurvation present to right medial nail border there is tenderness palpation.  Slight edema there is no erythema or warmth there is no drainage or pus.  No ascending cellulitis.  There are no open lesions.  Tenderness in the nail corner but no other areas of discomfort. No pain with calf compression, swelling, warmth, erythema  Assessment: Ingrown toenail right hallux  Plan: -All treatment options discussed with the patient including all alternatives, risks, complications.  -At this time, the patient is requesting partial nail removal with chemical matricectomy to the symptomatic portion of the nail. Risks and complications were discussed with the patient for which they understand and written consent was obtained. Under sterile conditions a total of 3 mL of a mixture of 2% lidocaine plain and 0.5% Marcaine plain was infiltrated in a hallux block fashion. Once anesthetized, the skin was prepped in sterile fashion. A tourniquet was then applied. Next the symptomatic aspect of hallux nail border was then sharply excised making sure to remove  the entire offending nail border. Once the nails were ensured to be removed area was debrided and the underlying skin was intact. There is no purulence identified in the procedure. Next phenol was then applied under standard conditions and copiously irrigated.  Betadine ointment was applied. A dry sterile dressing was applied. After application of the dressing the tourniquet was removed and there is found to be an immediate capillary refill time to the digit. The patient tolerated the procedure well any complications. Post procedure instructions were discussed the patient for which he verbally understood. Discussed signs/symptoms of infection and directed to call the office immediately should any occur or go directly to the emergency room. In the meantime, encouraged to call the office with any questions, concerns, changes symptoms. -New power steps were dispensed to the lateral post applied to both inserts. -Patient encouraged to call the office with any questions, concerns, change in symptoms.   Vivi Barrack DPM

## 2023-03-18 ENCOUNTER — Ambulatory Visit (INDEPENDENT_AMBULATORY_CARE_PROVIDER_SITE_OTHER): Payer: Medicare HMO

## 2023-03-18 DIAGNOSIS — L600 Ingrowing nail: Secondary | ICD-10-CM

## 2023-03-18 NOTE — Progress Notes (Signed)
Patient in office today for a nail check after having an ingrown toenail removed from the right hallux medial border.   Patient denies nausea, vomiting, fever and chills at this time.   Advised patient to continue soaking in epsom salts once a day followed by antibiotic ointment and a band-aid. Can leave uncovered at night. Continue this until completely healed.   If the area has not healed in 2 weeks, call the office for follow-up appointment, or sooner if any problems arise.   Monitor for any signs/symptoms of infection. Call the office immediately if any occur or go directly to the emergency room. Call with any questions/concerns.

## 2023-10-24 ENCOUNTER — Inpatient Hospital Stay (HOSPITAL_COMMUNITY)
Admission: EM | Admit: 2023-10-24 | Discharge: 2023-10-28 | DRG: 062 | Disposition: A | Discharge status: Rehabilitation Facility | Payer: Medicare Other | Attending: Neurology | Admitting: Neurology

## 2023-10-24 ENCOUNTER — Emergency Department (HOSPITAL_COMMUNITY): Payer: Medicare Other

## 2023-10-24 ENCOUNTER — Inpatient Hospital Stay (HOSPITAL_COMMUNITY): Payer: Medicare Other

## 2023-10-24 DIAGNOSIS — Z888 Allergy status to other drugs, medicaments and biological substances status: Secondary | ICD-10-CM | POA: Diagnosis not present

## 2023-10-24 DIAGNOSIS — Z83438 Family history of other disorder of lipoprotein metabolism and other lipidemia: Secondary | ICD-10-CM | POA: Diagnosis not present

## 2023-10-24 DIAGNOSIS — I10 Essential (primary) hypertension: Secondary | ICD-10-CM | POA: Diagnosis present

## 2023-10-24 DIAGNOSIS — G8191 Hemiplegia, unspecified affecting right dominant side: Secondary | ICD-10-CM | POA: Diagnosis present

## 2023-10-24 DIAGNOSIS — E871 Hypo-osmolality and hyponatremia: Secondary | ICD-10-CM | POA: Diagnosis not present

## 2023-10-24 DIAGNOSIS — E7800 Pure hypercholesterolemia, unspecified: Secondary | ICD-10-CM | POA: Diagnosis present

## 2023-10-24 DIAGNOSIS — R29810 Facial weakness: Secondary | ICD-10-CM | POA: Diagnosis present

## 2023-10-24 DIAGNOSIS — R3989 Other symptoms and signs involving the genitourinary system: Secondary | ICD-10-CM | POA: Diagnosis present

## 2023-10-24 DIAGNOSIS — K5900 Constipation, unspecified: Secondary | ICD-10-CM | POA: Diagnosis not present

## 2023-10-24 DIAGNOSIS — I639 Cerebral infarction, unspecified: Secondary | ICD-10-CM | POA: Diagnosis not present

## 2023-10-24 DIAGNOSIS — E041 Nontoxic single thyroid nodule: Secondary | ICD-10-CM | POA: Diagnosis present

## 2023-10-24 DIAGNOSIS — R471 Dysarthria and anarthria: Secondary | ICD-10-CM | POA: Diagnosis present

## 2023-10-24 DIAGNOSIS — I6389 Other cerebral infarction: Secondary | ICD-10-CM | POA: Diagnosis not present

## 2023-10-24 DIAGNOSIS — Z882 Allergy status to sulfonamides status: Secondary | ICD-10-CM | POA: Diagnosis not present

## 2023-10-24 DIAGNOSIS — Z79899 Other long term (current) drug therapy: Secondary | ICD-10-CM | POA: Diagnosis not present

## 2023-10-24 DIAGNOSIS — Z880 Allergy status to penicillin: Secondary | ICD-10-CM

## 2023-10-24 DIAGNOSIS — Z87891 Personal history of nicotine dependence: Secondary | ICD-10-CM | POA: Diagnosis not present

## 2023-10-24 DIAGNOSIS — F32A Depression, unspecified: Secondary | ICD-10-CM | POA: Diagnosis present

## 2023-10-24 DIAGNOSIS — I69354 Hemiplegia and hemiparesis following cerebral infarction affecting left non-dominant side: Secondary | ICD-10-CM | POA: Diagnosis not present

## 2023-10-24 DIAGNOSIS — Z833 Family history of diabetes mellitus: Secondary | ICD-10-CM

## 2023-10-24 DIAGNOSIS — L409 Psoriasis, unspecified: Secondary | ICD-10-CM | POA: Diagnosis present

## 2023-10-24 DIAGNOSIS — R200 Anesthesia of skin: Secondary | ICD-10-CM | POA: Diagnosis not present

## 2023-10-24 DIAGNOSIS — R270 Ataxia, unspecified: Secondary | ICD-10-CM | POA: Diagnosis present

## 2023-10-24 DIAGNOSIS — K5901 Slow transit constipation: Secondary | ICD-10-CM | POA: Diagnosis not present

## 2023-10-24 DIAGNOSIS — R29705 NIHSS score 5: Secondary | ICD-10-CM | POA: Diagnosis present

## 2023-10-24 DIAGNOSIS — I6381 Other cerebral infarction due to occlusion or stenosis of small artery: Principal | ICD-10-CM | POA: Diagnosis present

## 2023-10-24 DIAGNOSIS — D72829 Elevated white blood cell count, unspecified: Secondary | ICD-10-CM | POA: Diagnosis present

## 2023-10-24 DIAGNOSIS — B029 Zoster without complications: Secondary | ICD-10-CM | POA: Diagnosis not present

## 2023-10-24 DIAGNOSIS — Z66 Do not resuscitate: Secondary | ICD-10-CM | POA: Diagnosis not present

## 2023-10-24 DIAGNOSIS — R262 Difficulty in walking, not elsewhere classified: Secondary | ICD-10-CM | POA: Diagnosis present

## 2023-10-24 DIAGNOSIS — E785 Hyperlipidemia, unspecified: Secondary | ICD-10-CM | POA: Insufficient documentation

## 2023-10-24 HISTORY — DX: Other nonmedicinal substance allergy status: Z91.048

## 2023-10-24 HISTORY — DX: Psoriasis, unspecified: L40.9

## 2023-10-24 HISTORY — DX: Trigger finger, right middle finger: M65.331

## 2023-10-24 HISTORY — DX: Pure hypercholesterolemia, unspecified: E78.00

## 2023-10-24 LAB — COMPREHENSIVE METABOLIC PANEL
ALT: 30 U/L (ref 0–44)
AST: 25 U/L (ref 15–41)
Albumin: 4 g/dL (ref 3.5–5.0)
Alkaline Phosphatase: 68 U/L (ref 38–126)
Anion gap: 9 (ref 5–15)
BUN: 18 mg/dL (ref 8–23)
CO2: 22 mmol/L (ref 22–32)
Calcium: 9.6 mg/dL (ref 8.9–10.3)
Chloride: 104 mmol/L (ref 98–111)
Creatinine, Ser: 0.78 mg/dL (ref 0.44–1.00)
GFR, Estimated: >60 mL/min (ref >60)
Glucose, Bld: 105 mg/dL — ABNORMAL HIGH (ref 70–99)
Potassium: 4.4 mmol/L (ref 3.5–5.1)
Sodium: 135 mmol/L (ref 135–145)
Total Bilirubin: 0.6 mg/dL (ref 0.0–1.2)
Total Protein: 7 g/dL (ref 6.5–8.1)

## 2023-10-24 LAB — I-STAT CHEM 8, ED
BUN: 18 mg/dL (ref 8–23)
Calcium, Ion: 1.15 mmol/L (ref 1.15–1.40)
Chloride: 105 mmol/L (ref 98–111)
Creatinine, Ser: 0.7 mg/dL (ref 0.44–1.00)
Glucose, Bld: 100 mg/dL — ABNORMAL HIGH (ref 70–99)
HCT: 40 % (ref 36.0–46.0)
Hemoglobin: 13.6 g/dL (ref 12.0–15.0)
Potassium: 4.3 mmol/L (ref 3.5–5.1)
Sodium: 136 mmol/L (ref 135–145)
TCO2: 23 mmol/L (ref 22–32)

## 2023-10-24 LAB — MRSA NEXT GEN BY PCR, NASAL: MRSA by PCR Next Gen: NOT DETECTED

## 2023-10-24 LAB — CBC
HCT: 40.5 % (ref 36.0–46.0)
Hemoglobin: 13 g/dL (ref 12.0–15.0)
MCH: 27.4 pg (ref 26.0–34.0)
MCHC: 32.1 g/dL (ref 30.0–36.0)
MCV: 85.4 fL (ref 80.0–100.0)
Platelets: 324 10*3/uL (ref 150–400)
RBC: 4.74 MIL/uL (ref 3.87–5.11)
RDW: 14.4 % (ref 11.5–15.5)
WBC: 10.8 10*3/uL — ABNORMAL HIGH (ref 4.0–10.5)
nRBC: 0 % (ref 0.0–0.2)

## 2023-10-24 LAB — PROTIME-INR
INR: 1 (ref 0.8–1.2)
Prothrombin Time: 13.3 s (ref 11.4–15.2)

## 2023-10-24 LAB — DIFFERENTIAL
Abs Immature Granulocytes: 0.04 10*3/uL (ref 0.00–0.07)
Basophils Absolute: 0.1 10*3/uL (ref 0.0–0.1)
Basophils Relative: 1 %
Eosinophils Absolute: 0.3 10*3/uL (ref 0.0–0.5)
Eosinophils Relative: 3 %
Immature Granulocytes: 0 %
Lymphocytes Relative: 26 %
Lymphs Abs: 2.9 10*3/uL (ref 0.7–4.0)
Monocytes Absolute: 1 10*3/uL (ref 0.1–1.0)
Monocytes Relative: 9 %
Neutro Abs: 6.5 10*3/uL (ref 1.7–7.7)
Neutrophils Relative %: 61 %

## 2023-10-24 LAB — APTT: aPTT: 29 s (ref 24–36)

## 2023-10-24 LAB — I-STAT CG4 LACTIC ACID, ED: Lactic Acid, Venous: 1.2 mmol/L (ref 0.5–1.9)

## 2023-10-24 LAB — ETHANOL: Alcohol, Ethyl (B): <10 mg/dL (ref <10)

## 2023-10-24 LAB — CBG MONITORING, ED: Glucose-Capillary: 107 mg/dL — ABNORMAL HIGH (ref 70–99)

## 2023-10-24 MED ORDER — TENECTEPLASE FOR STROKE
0.2500 mg/kg | PACK | Freq: Once | INTRAVENOUS | Status: AC
Start: 2023-10-24 — End: 2023-10-24
  Administered 2023-10-24: 19 mg via INTRAVENOUS
  Filled 2023-10-24: qty 10

## 2023-10-24 MED ORDER — ORAL CARE MOUTH RINSE: 15.0000 mL | OROMUCOSAL | Status: DC | PRN

## 2023-10-24 MED ORDER — STROKE: EARLY STAGES OF RECOVERY BOOK: Freq: Once | Status: AC

## 2023-10-24 MED ORDER — CHLORHEXIDINE GLUCONATE CLOTH 2 % EX PADS
6.0000 | MEDICATED_PAD | Freq: Every day | CUTANEOUS | Status: DC
  Administered 2023-10-24 – 2023-10-27 (×4): 6 via TOPICAL

## 2023-10-24 MED ORDER — ONDANSETRON HCL 4 MG/2ML IJ SOLN
4.0000 mg | Freq: Three times a day (TID) | INTRAMUSCULAR | Status: DC | PRN
  Administered 2023-10-24: 4 mg via INTRAVENOUS
  Filled 2023-10-24: qty 2

## 2023-10-24 MED ORDER — SENNOSIDES-DOCUSATE SODIUM 8.6-50 MG PO TABS: 1.0000 | ORAL_TABLET | Freq: Every evening | ORAL | Status: DC | PRN

## 2023-10-24 MED ORDER — SODIUM CHLORIDE 0.9% FLUSH
3.0000 mL | Freq: Once | INTRAVENOUS | Status: AC
  Administered 2023-10-24: 3 mL via INTRAVENOUS

## 2023-10-24 MED ORDER — SODIUM CHLORIDE 0.9 % IV SOLN: INTRAVENOUS | Status: DC

## 2023-10-24 MED ORDER — ACETAMINOPHEN 325 MG PO TABS
650.0000 mg | ORAL_TABLET | ORAL | Status: DC | PRN
  Filled 2023-10-24: qty 2

## 2023-10-24 MED ORDER — ACETAMINOPHEN 160 MG/5ML PO SOLN: 650.0000 mg | ORAL | Status: DC | PRN

## 2023-10-24 MED ORDER — PANTOPRAZOLE SODIUM 40 MG IV SOLR
40.0000 mg | Freq: Every day | INTRAVENOUS | Status: DC
  Administered 2023-10-24: 40 mg via INTRAVENOUS
  Filled 2023-10-24: qty 10

## 2023-10-24 MED ORDER — IOHEXOL 350 MG/ML SOLN
75.0000 mL | Freq: Once | INTRAVENOUS | Status: AC | PRN
  Administered 2023-10-24: 75 mL via INTRAVENOUS

## 2023-10-24 MED ORDER — ACETAMINOPHEN 650 MG RE SUPP: 650.0000 mg | RECTAL | Status: DC | PRN

## 2023-10-24 NOTE — Progress Notes (Signed)
 PHARMACIST CODE STROKE RESPONSE  Notified to mix TNK at 1413 by Dr. Vanessa  TNK preparation completed at 1414  TNK dose = 19 mg IV over 5 seconds  Issues/delays encountered (if applicable): n/a Pt walk/gait tested   Sharyne Glatter, PharmD, BCCCP Clinical Pharmacist 10/24/2023 2:16 PM

## 2023-10-24 NOTE — ED Triage Notes (Addendum)
 Pt arrives via ems from work for the c/o stroke like symptoms that started around 1005 this morning while pt was at work. Pt was having right sided weakness and right sided facial droop. VSS with ems. Pt Is alert and oriented

## 2023-10-24 NOTE — Plan of Care (Signed)
  Problem: Ischemic Stroke/TIA Tissue Perfusion: Goal: Complications of ischemic stroke/TIA will be minimized Outcome: Progressing   Problem: Coping: Goal: Will verbalize positive feelings about self Outcome: Progressing   Problem: Pain Management: Goal: General experience of comfort will improve Outcome: Progressing

## 2023-10-24 NOTE — Code Documentation (Addendum)
 Bernese Doffing is a 78 yr old woman with no significant past medical history arriving to [facility] via EMS on 10/24/2023. Pt is coming from work, where she was last known well this morning at 1000 as she went on break. She was walking back to her work station at [employer] when she noticed she was having difficulty walking. A co-worker called EMS, and code stroke was activated for rt sided weakness and dysarthria. She is on no blood thinners.    Pt met at bridge by stroke team. Airway cleared by EDP, labs and CBG obtained. Pt to CT with team. NIHSS 5. Pt with rt arm and leg drift, dysarthria, rt droop, and mild rt sensory loss. The following imaging was obtained: CT, CTA. Per Dr. Vanessa, CT is negative for hemorrhage. After speaking with her son, pt consented for TNK. TNK given at 1415. CTA then obtained. Per Dr. Vanessa, CTA is negative for LVO.     Pt then taken back to ED room 29 where her workup will continue. She will need q 15 min NIHSS and BP for 2 hrs, then q 30 min for 6 hours, then hourly for the next 16. Bedside handoff with Gabe RN complete. Pt not eligible for endovascular therapy as no LVO.

## 2023-10-24 NOTE — ED Notes (Signed)
 Report given to 4N at this time

## 2023-10-24 NOTE — ED Notes (Signed)
 Help get patient undressed into a gown on the monitor did EKG shown to er provider got patient a warm blanket patient is resting with call bell in reach

## 2023-10-24 NOTE — Progress Notes (Signed)
 Pt has had 2 brief episodes of nausea with movement. Otherwise still alert, talkative, NIHSS 5. No H/A. MD notified, will continue to monitor pt closely.

## 2023-10-24 NOTE — H&P (Signed)
 NEUROLOGY H&P NOTE   Date of service: October 24, 2023 Patient Name: Jordan Collier MRN:  [id number] DOB:  [birth year] Chief Complaint: Right-sided weakness  History of Present Illness  Jordan Collier is a 78 y.o. female with history of hypercholesterolemia, herpes simplex, cataract who presents with sudden onset right-sided weakness.  Patient reports that she was at work, she stepped outside to take a break and sat in her car at 10 AM this morning.  When she tried to get out of her car at 1015, she noted that her right leg was giving her significant trouble.  She thought initially that she sat on it and had a pinched nerve but when the symptoms persisted, she called EMS and was brought in as a code stroke with right-sided weakness.  She denies any prior history of strokes, no family history of strokes, denies any headaches.  Her symptoms were persistent and in fact she had some worsening of her right facial droop while she is within the ED.  She had CT head without contrast was negative for any acute intracranial normalities.  We tried to set patient up and try to get her to walk to assess how disabling her symptoms were.  It was noted the patient was having significant trouble with even sitting up and was leaning to her right and could not stand up or take a step.  Given the disabling nature of her symptoms, she was offered TNKase .  I discussed extensively with her the risks and benefits of TNKase  and his mode of administration.  I discussed with patient about 30% chance of improving her symptoms about 3 to 5% risk of bleeding in her brain which could result in worsening of her symptoms or even death.  Patient understands risks and benefits of TNKase  and consented to administration of TNKase .  Last known well: 10 00 on 10/24/2023 Modified rankin score: 0-Completely asymptomatic and back to baseline post- stroke IV Thrombolysis: Yes, pt consented to tnakse administration. Thrombectomy: Not  offered due to no LVO NIHSS components Score: Comment  1a Level of Conscious 0[x]  1[]  2[]  3[]      1b LOC Questions 0[x]  1[]  2[]       1c LOC Commands 0[x]  1[]  2[]       2 Best Gaze 0[x]  1[]  2[]       3 Visual 0[x]  1[]  2[]  3[]      4 Facial Palsy 0[]  1[x]  2[]  3[]      5a Motor Arm - left 0[x]  1[]  2[]  3[]  4[]  UN[]    5b Motor Arm - Right 0[x]  1[]  2[]  3[]  4[]  UN[]    6a Motor Leg - Left 0[x]  1[]  2[]  3[]  4[]  UN[]    6b Motor Leg - Right 0[]  1[x]  2[]  3[]  4[]  UN[]    7 Limb Ataxia 0[]  1[]  2[x]  3[]  UN[]     8 Sensory 0[]  1[x]  2[]  UN[]      9 Best Language 0[x]  1[]  2[]  3[]      10 Dysarthria 0[x]  1[]  2[]  UN[]      11 Extinct. and Inattention 0[x]  1[]  2[]       TOTAL: 5      ROS  Comprehensive ROS performed and pertinent positives documented in the HPI  Past History  No past medical history on file.  No family history on file. Social History   Socioeconomic History   Marital status: Divorced    Spouse name: Not on file   Number of children: Not on file   Years of education: Not on file   Highest education level: Not  on file  Occupational History   Not on file  Tobacco Use   Smoking status: Not on file   Smokeless tobacco: Not on file  Substance and Sexual Activity   Alcohol  use: Not on file   Drug use: Not on file   Sexual activity: Not on file  Other Topics Concern   Not on file  Social History Narrative   Not on file   Social Drivers of Health   Financial Resource Strain: Not on file  Food Insecurity: No Food Insecurity (02/14/2022)   Received from [facility], [facility]   Hunger Vital Sign    Worried About Running Out of Food in the Last Year: Never true    Ran Out of Food in the Last Year: Never true  Transportation Needs: Not on file  Physical Activity: Not on file  Stress: Not on file  Social Connections: Unknown (03/03/2022)   Received from [facility], [facility]   Social Network    Social Network: Not on file   Allergies  Allergen Reactions    Dexamethasone Rash   Molds & Smuts Other (See Comments)    Mucus over production   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 Other (See Comments)    Mucus over production   Penicillins     Other reaction(s): Unknown   Sulfa Antibiotics     Other reaction(s): Unknown    Medications   Current Facility-Administered Medications:    [START ON 10/25/2023]  stroke: early stages of recovery book, , Does not apply, Once, Austen Wygant, MD   0.9 %  sodium chloride  infusion, , Intravenous, Continuous, Dene Landsberg, MD   acetaminophen  (TYLENOL ) tablet 650 mg, 650 mg, Oral, Q4H PRN **OR** acetaminophen  (TYLENOL ) 160 MG/5ML solution 650 mg, 650 mg, Per Tube, Q4H PRN **OR** acetaminophen  (TYLENOL ) suppository 650 mg, 650 mg, Rectal, Q4H PRN, Aragon Scarantino, MD   pantoprazole  (PROTONIX ) injection 40 mg, 40 mg, Intravenous, QHS, Suhaas Agena, MD   senna-docusate (Senokot-S) tablet 1 tablet, 1 tablet, Oral, QHS PRN, Hiedi Touchton, MD   sodium chloride  flush (NS) 0.9 % injection 3 mL, 3 mL, Intravenous, Once, Mannie Pac T, DO  Current Outpatient Medications:    cetirizine (ZYRTEC) 10 MG tablet, 1 tablet, Disp: , Rfl:    Cholecalciferol  (KP VITAMIN D ) 25 MCG (1000 UT) capsule, Take by mouth., Disp: , Rfl:    Cyanocobalamin  1000 MCG/15ML LIQD, See admin instructions., Disp: , Rfl:    guaiFENesin  (MUCINEX ) 600 MG 12 hr tablet, Take by mouth., Disp: , Rfl:    sodium chloride  (OCEAN) 0.65 % nasal spray, 2 sprays in each nostril as needed, Disp: , Rfl:    valACYclovir  (VALTREX ) 1000 MG tablet, Take by mouth., Disp: , Rfl:    Vitals   Vitals:   10/24/23 1400 10/24/23 1440  BP:  (!) 175/90  Pulse:  85  Resp:  12  SpO2:  100%  Weight: 74.2 kg      There is no height or weight on file to calculate BMI.  Physical Exam   General: Laying comfortably in bed; in no acute distress.  HENT: Normal oropharynx and mucosa. Normal external appearance of ears and nose.  Neck: Supple, no pain or  tenderness  CV: No JVD. No peripheral edema.  Pulmonary: Symmetric Chest rise. Normal respiratory effort.  Abdomen: Soft to touch, non-tender.  Ext: No cyanosis, edema, or deformity  Skin: No rash. Normal palpation of skin.   Musculoskeletal: Normal digits and nails by inspection. No clubbing.  Neurologic Examination  Mental status/Cognition: Alert, oriented to self, place, month and year, good attention.  Speech/language: Fluent, comprehension intact, object naming intact, repetition intact.  Cranial nerves:   CN II Pupils equal and reactive to light, no VF deficits    CN III,IV,VI EOM intact, no gaze preference or deviation, no nystagmus    CN V normal sensation in V1, V2, and V3 segments bilaterally    CN VII Slight right facial droop, more pronounced while she is talking.   CN VIII normal hearing to speech    CN IX & X normal palatal elevation, no uvular deviation    CN XI 5/5 head turn and 5/5 shoulder shrug bilaterally    CN XII midline tongue protrusion    Motor:  Muscle bulk: normal, tone normal Mvmt Root Nerve  Muscle Right Left Comments  SA C5/6 Ax Deltoid 5 5   EF C5/6 Mc Biceps 5 5   EE C6/7/8 Rad Triceps 5 5   WF C6/7 Med FCR     WE C7/8 PIN ECU     F Ab C8/T1 U ADM/FDI 5 5   HF L1/2/3 Fem Illopsoas 4 5   KE L2/3/4 Fem Quad 4 5   DF L4/5 D Peron Tib Ant 4+ 5   PF S1/2 Tibial Grc/Sol 4+ 5    Sensation:  Light touch Slightly decreased in right upper extremity to light touch.   Pin prick    Temperature    Vibration   Proprioception    Coordination/Complex Motor:  - Finger to Nose with ataxia in RUE - Heel to shin with ataxia in RLE - Rapid alternating movement are slowed on the right - Gait: Slumped over to the right upon trying to get her to stand up.  Gait unfortunately was unable to be assessed as patient was unable to take a single step. Labs   CBC:  Recent Labs  Lab 10/24/23 1358 10/24/23 1404  WBC 10.8*  --   NEUTROABS 6.5  --   HGB 13.0 13.6   HCT 40.5 40.0  MCV 85.4  --   PLT 324  --    Basic Metabolic Panel:  Lab Results  Component Value Date   NA 136 10/24/2023   K 4.3 10/24/2023   CO2 22 10/24/2023   GLUCOSE 100 (H) 10/24/2023   BUN 18 10/24/2023   CREATININE 0.70 10/24/2023   CALCIUM 9.6 10/24/2023   GFRNONAA >60 10/24/2023   Lipid Panel: No results found for: LDLCALC HgbA1c: No results found for: HGBA1C Urine Drug Screen: No results found for: LABOPIA, COCAINSCRNUR, LABBENZ, AMPHETMU, THCU, LABBARB  Alcohol  Level     Component Value Date/Time   ETH <10 10/24/2023 1358   INR  Lab Results  Component Value Date   INR 1.0 10/24/2023   APTT  Lab Results  Component Value Date   APTT 29 10/24/2023     CT Head without contrast(Personally reviewed): CTH was negative for a large hypodensity concerning for a large territory infarct or hyperdensity concerning for an ICH  CT angio Head and Neck with contrast(Personally reviewed): No LVO  MRI Brain(Personally reviewed): Pending  Assessment   Jordan Collier is a 78 y.o. female with history of hyperlipidemia presented with sudden onset of right-sided weakness involving right arm and right leg with notable ataxia.  Upon attempting to get patient to stand up, she is slumps over to the right and is unable to take a single step.  Symptoms were felt to be disabling and  therefore patient was offered TNKase .  Patient understands the risks and benefits and consented to tnakse.   Primary Diagnosis:  Cerebral infarction, unspecified.  Recommendations  Plan: - Frequent NeuroChecks for post tNK care per stroke unit protocol: - Initial CTH demonstrated no acute hemorrhage or mass - MRI Brain - pending - CTA - no LVO - TTE - pending - Lipid Panel: LDL - pending  - Statin: if LDL > 70 - HbA1c: pending - Antithrombotic: Start ASA 81 mg daily if 24 h CTH does not show acute hemorrhage - DVT prophylaxis: SCDs. Pharmacologic prophylaxis if 24 h CTH  does not demonstrate acute hemorrhage - Systolic Blood Pressure goal: < 180 mm Hg - Telemetry monitoring for arrhythmia: 72 hours - Swallow screen - ordered - PT/OT/SLP consults  ______________________________________________________________________  This patient is critically ill and at significant risk of neurological worsening, death and care requires constant monitoring of vital signs, hemodynamics,respiratory and cardiac monitoring, neurological assessment, discussion with family, other specialists and medical decision making of high complexity. I spent 40 minutes of neurocritical care time  in the care of  this patient. This was time spent independent of any time provided by nurse practitioner or PA.  Argie Applegate [facility] 10/24/2023  3:01 PM  Signed, Farida Mcreynolds, MD [facility]  [facility] personally reviewed prior to TNK administration.

## 2023-10-24 NOTE — Progress Notes (Signed)
 Attempted Echocardiogram, patient is being transported to a different floor.

## 2023-10-24 NOTE — ED Provider Notes (Signed)
 [place] [facility] DE[facility] AT [facility] Provider Note  CSN: [id number] Arrival date & time: 10/24/23 1356  Chief Complaint(s) No chief complaint on file.  HPI Jordan Collier is a 78 y.o. female who comes in today for difficulty with walking.  Last known well was 10 AM.  Patient reports she was getting out of her car, and she felt like she was tripping over her feet.  She returned to work, was still not feeling right.  She called EMS who activated the patient as a possible code stroke.   Past Medical History No past medical history on file. There are no active problems to display for this patient.  Home Medication(s) Prior to Admission medications   Medication Sig Start Date End Date Taking? Authorizing Provider  cetirizine (ZYRTEC) 10 MG tablet 1 tablet    [provider]  Cholecalciferol  (KP VITAMIN D ) 25 MCG (1000 UT) capsule Take by mouth.    [provider]  Cyanocobalamin  1000 MCG/15ML LIQD See admin instructions.    [provider]  guaiFENesin  (MUCINEX ) 600 MG 12 hr tablet Take by mouth.    [provider]  sodium chloride  (OCEAN) 0.65 % nasal spray 2 sprays in each nostril as needed    [provider]  valACYclovir  (VALTREX ) 1000 MG tablet Take by mouth.    [provider]                                                                                                                                    Past Surgical History  Family History No family history on file.  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, Penicillins, and Sulfa antibiotics  Review of Systems Review of Systems  Physical Exam Vital Signs  I have reviewed the triage vital signs Wt 74.2 kg   Physical Exam  ED Results and Treatments Labs (all labs ordered are listed, but only abnormal results are displayed) Labs Reviewed  CBC - Abnormal; Notable for the following components:       Result Value   WBC 10.8 (*)    All other components within normal limits  I-STAT CHEM 8, ED - Abnormal; Notable for the following components:   Glucose, Bld 100 (*)    All other components within normal limits  DIFFERENTIAL  PROTIME-INR  APTT  COMPREHENSIVE METABOLIC PANEL  ETHANOL  I-STAT CG4 LACTIC ACID, ED  CBG MONITORING, ED  Radiology CT HEAD CODE STROKE WO CONTRAST Result Date: 10/24/2023 CLINICAL DATA:  Code stroke. Neuro deficit, acute, stroke suspected right-sided weakness EXAM: CT HEAD WITHOUT CONTRAST TECHNIQUE: Contiguous axial images were obtained from the base of the skull through the vertex without intravenous contrast. RADIATION DOSE REDUCTION: This exam was performed according to the departmental dose-optimization program which includes automated exposure control, adjustment of the mA and/or kV according to patient size and/or use of iterative reconstruction technique. COMPARISON:  None Available. FINDINGS: Brain: No hemorrhage. No hydrocephalus. No extra-axial fluid collection. No CT evidence of an acute cortical infarct. No mass effect. No mass lesion. There is a background of moderate chronic microvascular ischemic change. Vascular: No hyperdense vessel or unexpected calcification. Skull: Normal. Negative for fracture or focal lesion. Sinuses/Orbits: No middle ear or mastoid effusion. Paranasal sinuses are clear. Bilateral lens replacement. Orbits are otherwise unremarkable. Other: None. ASPECTS [facility] Stroke Program Early CT Score): 10 IMPRESSION: No hemorrhage or CT evidence of an acute cortical infarct. Aspects is 10. Findings were paged with Dr. Vanessa on 10/24/23 at 2:15 PM. Electronically Signed   By: Lyndall Gore M.D.   On: 10/24/2023 14:16    Pertinent labs & imaging results that were available during my care of the patient were reviewed by me  and considered in my medical decision making (see MDM for details).  Medications Ordered in ED Medications  sodium chloride  flush (NS) 0.9 % injection 3 mL (has no administration in time range)  tenecteplase  (TNKASE ) injection for Stroke 19 mg (has no administration in time range)                                                                                                                                     Procedures .Critical Care  Performed by: Mannie Fairy DASEN, DO Authorized by: Mannie Fairy DASEN, DO   Critical care provider statement:    Critical care time (minutes):  30   Critical care was necessary to treat or prevent imminent or life-threatening deterioration of the following conditions:  CNS failure or compromise   Critical care was time spent personally by me on the following activities:  Development of treatment plan with patient or surrogate, discussions with consultants, evaluation of patient's response to treatment, examination of patient, ordering and review of laboratory studies, ordering and review of radiographic studies, ordering and performing treatments and interventions, pulse oximetry, re-evaluation of patient's condition and review of old charts   (including critical care time)  Medical Decision Making / ED Course   This patient presents to the ED for concern of possible stroke, this involves an extensive number of treatment options, and is a complaint that carries with it a high risk of complications and morbidity.  The differential diagnosis includes CVA, metabolic abnormalities.  MDM: Patient finding concerning for CVA.  Had a noncontrasted head CT scan which did not show active bleed.  Neurology made decision to  give patient lytics.  No contraindications to lytics for this patient.  Patient was within 4-1/2-hour window, just barely.  Patient did receive TNK, will be admitted to the neuro ICU.   Additional history obtained: -Additional history obtained  from EMS -External records from outside source obtained and reviewed including: Chart review including previous notes, labs, imaging, consultation notes   Lab Tests: -I ordered, reviewed, and interpreted labs.   The pertinent results include:   Labs Reviewed  CBC - Abnormal; Notable for the following components:      Result Value   WBC 10.8 (*)    All other components within normal limits  I-STAT CHEM 8, ED - Abnormal; Notable for the following components:   Glucose, Bld 100 (*)    All other components within normal limits  DIFFERENTIAL  PROTIME-INR  APTT  COMPREHENSIVE METABOLIC PANEL  ETHANOL  I-STAT CG4 LACTIC ACID, ED  CBG MONITORING, ED      EKG   EKG Interpretation Date/Time:    Ventricular Rate:    PR Interval:    QRS Duration:    QT Interval:    QTC Calculation:   R Axis:      Text Interpretation:           Imaging Studies ordered: I ordered imaging studies including CT head I independently visualized and interpreted imaging. I agree with the radiologist interpretation   Medicines ordered and prescription drug management: Meds ordered this encounter  Medications   sodium chloride  flush (NS) 0.9 % injection 3 mL   tenecteplase  (TNKASE ) injection for Stroke 19 mg    -I have reviewed the patients home medicines and have made adjustments as needed  Critical interventions Management of ischemic stroke   Cardiac Monitoring: The patient was maintained on a cardiac monitor.  I personally viewed and interpreted the cardiac monitored which showed an underlying rhythm of: Normal sinus rhythm  Social Determinants of Health:  Factors impacting patients care include:    Reevaluation: After the interventions noted above, I reevaluated the patient and found that they have :improved  Co morbidities that complicate the patient evaluation No past medical history on file.    Dispostion: Admission to neuro ICU.     Final Clinical Impression(s) / ED  Diagnoses Final diagnoses:  Cerebrovascular accident (CVA), unspecified mechanism (HCC)  Ischemic stroke (HCC)     @PCDICTATION @    Mannie Pac T, DO 10/24/23 1453

## 2023-10-25 ENCOUNTER — Inpatient Hospital Stay (HOSPITAL_COMMUNITY): Payer: Medicare Other

## 2023-10-25 DIAGNOSIS — I6389 Other cerebral infarction: Secondary | ICD-10-CM

## 2023-10-25 LAB — ECHOCARDIOGRAM COMPLETE
AR max vel: 2.41 cm2
AV Peak grad: 10 mm[Hg]
Ao pk vel: 1.58 m/s
Area-P 1/2: 3.21 cm2
Height: 68 in
MV M vel: 5.86 m/s
MV Peak grad: 137.4 mm[Hg]
P 1/2 time: 486 ms
S' Lateral: 3 cm
Weight: 2617.3 [oz_av]

## 2023-10-25 LAB — CBC
HCT: 37.7 % (ref 36.0–46.0)
Hemoglobin: 12.5 g/dL (ref 12.0–15.0)
MCH: 27.7 pg (ref 26.0–34.0)
MCHC: 33.2 g/dL (ref 30.0–36.0)
MCV: 83.6 fL (ref 80.0–100.0)
Platelets: 325 10*3/uL (ref 150–400)
RBC: 4.51 MIL/uL (ref 3.87–5.11)
RDW: 14.3 % (ref 11.5–15.5)
WBC: 13.6 10*3/uL — ABNORMAL HIGH (ref 4.0–10.5)
nRBC: 0 % (ref 0.0–0.2)

## 2023-10-25 LAB — BASIC METABOLIC PANEL
Anion gap: 7 (ref 5–15)
BUN: 14 mg/dL (ref 8–23)
CO2: 22 mmol/L (ref 22–32)
Calcium: 8.8 mg/dL — ABNORMAL LOW (ref 8.9–10.3)
Chloride: 106 mmol/L (ref 98–111)
Creatinine, Ser: 0.68 mg/dL (ref 0.44–1.00)
GFR, Estimated: >60 mL/min (ref >60)
Glucose, Bld: 107 mg/dL — ABNORMAL HIGH (ref 70–99)
Potassium: 4.4 mmol/L (ref 3.5–5.1)
Sodium: 135 mmol/L (ref 135–145)

## 2023-10-25 LAB — HEMOGLOBIN A1C
Hgb A1c MFr Bld: 5.8 % — ABNORMAL HIGH (ref 4.8–5.6)
Mean Plasma Glucose: 120 mg/dL

## 2023-10-25 LAB — LIPID PANEL
Cholesterol: 291 mg/dL — ABNORMAL HIGH (ref 0–200)
HDL: 81 mg/dL (ref >40)
LDL Cholesterol: 196 mg/dL — ABNORMAL HIGH (ref 0–99)
Total CHOL/HDL Ratio: 3.6 {ratio}
Triglycerides: 68 mg/dL (ref <150)
VLDL: 14 mg/dL (ref 0–40)

## 2023-10-25 MED ORDER — EZETIMIBE 10 MG PO TABS
10.0000 mg | ORAL_TABLET | Freq: Every day | ORAL | Status: DC
  Administered 2023-10-25 – 2023-10-28 (×4): 10 mg via ORAL
  Filled 2023-10-25 (×4): qty 1

## 2023-10-25 MED ORDER — ASPIRIN 81 MG PO TBEC
81.0000 mg | DELAYED_RELEASE_TABLET | Freq: Every day | ORAL | Status: DC
  Administered 2023-10-25 – 2023-10-28 (×4): 81 mg via ORAL
  Filled 2023-10-25 (×4): qty 1

## 2023-10-25 MED ORDER — CLOPIDOGREL BISULFATE 75 MG PO TABS
75.0000 mg | ORAL_TABLET | Freq: Every day | ORAL | Status: DC
  Administered 2023-10-25 – 2023-10-28 (×4): 75 mg via ORAL
  Filled 2023-10-25 (×4): qty 1

## 2023-10-25 NOTE — Progress Notes (Addendum)
 STROKE TEAM PROGRESS NOTE   BRIEF HPI [name] is a 78 y.o. female with history of hypercholesterolemia, herpes simplex, cataracts presenting 1/3 with acute onset of right-sided weakness. Patient called EMS after she had trouble getting out of her car due to her right leg weakness. CODE STROKE was activated by EMS.  Denies any hitroy of strokes, headaches, does not take blood thinners. CTH negative. Patient was unable to walk on assessment, even leaning to right while trying to simply sit up. Management with thrombolytic therapy was explained to the patient, or patient's representative, as were risks, benefits and alternatives. All questions were answered. Patient, or patient's representative, expressed understanding of the treatment plan and agreed to proceed with thrombolytic treatment. LKW: 1000 1/3 mRs: 0 IVT: Yes, 1/3 1414 EVT: No, no LVO  NIH on Admission: 5  INTERIM HISTORY/SUBJECTIVE Admitted to 4N ICU post-TNK administration.  Overnight, patient states she continues to have some right-sided weakness, intermittent nausea and was unable to sleep.  On exam she has right facial droop, is dysarthric, right arm weakness, decreased sensation to right side, is oriented, follows all commands, no evidence of aphasia or neglect.  Pending 24-hour post TNK imaging.  Possible transfer out of ICU today  OBJECTIVE  CBC    Component Value Date/Time   WBC 13.6 (H) 10/25/2023 0452   RBC 4.51 10/25/2023 0452   HGB 12.5 10/25/2023 0452   HCT 37.7 10/25/2023 0452   PLT 325 10/25/2023 0452   MCV 83.6 10/25/2023 0452   MCH 27.7 10/25/2023 0452   MCHC 33.2 10/25/2023 0452   RDW 14.3 10/25/2023 0452   LYMPHSABS 2.9 10/24/2023 1358   MONOABS 1.0 10/24/2023 1358   EOSABS 0.3 10/24/2023 1358   BASOSABS 0.1 10/24/2023 1358    BMET    Component Value Date/Time   NA 135 10/25/2023 0452   K 4.4 10/25/2023 0452   CL 106 10/25/2023 0452   CO2 22 10/25/2023 0452   GLUCOSE 107 (H)  10/25/2023 0452   BUN 14 10/25/2023 0452   CREATININE 0.68 10/25/2023 0452   CALCIUM 8.8 (L) 10/25/2023 0452   GFRNONAA >60 10/25/2023 0452    IMAGING past 24 hours CT ANGIO HEAD NECK W WO CM (CODE STROKE) Result Date: 10/24/2023 CLINICAL DATA:  Neuro deficit, acute, stroke suspected T1 EXAM: CT ANGIOGRAPHY HEAD AND NECK WITH AND WITHOUT CONTRAST TECHNIQUE: Multidetector CT imaging of the head and neck was performed using the standard protocol during bolus administration of intravenous contrast. Multiplanar CT image reconstructions and MIPs were obtained to evaluate the vascular anatomy. Carotid stenosis measurements (when applicable) are obtained utilizing NASCET criteria, using the distal internal carotid diameter as the denominator. RADIATION DOSE REDUCTION: This exam was performed according to the departmental dose-optimization program which includes automated exposure control, adjustment of the mA and/or kV according to patient size and/or use of iterative reconstruction technique. CONTRAST:  75mL OMNIPAQUE  IOHEXOL  350 MG/ML SOLN COMPARISON:  None Available. FINDINGS: CT HEAD FINDINGS See same day CT head for intracranial findings. No contrast-enhancing lesions visualized. CTA NECK FINDINGS Aortic arch: Standard branching. Imaged portion shows no evidence of aneurysm or dissection. No significant stenosis of the major arch vessel origins. Right carotid system: No evidence of dissection, stenosis (50% or greater), or occlusion. Left carotid system: No evidence of dissection, stenosis (50% or greater), or occlusion. Vertebral arteries: Codominant. No evidence of dissection, stenosis (50% or greater), or occlusion. Skeleton: Likely chronic superior endplate compression deformities at T2-T4. Other neck: There is a 2.4  x 2.0 cm left thyroid  nodule. Recommend further evaluation with a dedicated thyroid  ultrasound. Upper chest: Negative. Review of the MIP images confirms the above findings CTA HEAD FINDINGS  Anterior circulation: No proximal occlusion, aneurysm, or vascular malformation. Moderate narrowing in the cavernous segment of the right ICA Posterior circulation: No significant stenosis, proximal occlusion, aneurysm, or vascular malformation. Fenestrated basilar artery Venous sinuses: As permitted by contrast timing, patent. Anatomic variants: No Review of the MIP images confirms the above findings IMPRESSION: 1. No intracranial large vessel occlusion. 2. No hemodynamically significant stenosis in the neck. 3. There is a 2.4 x 2.0 cm left thyroid  nodule. Recommend further evaluation with a dedicated thyroid  ultrasound. Electronically Signed   By: Lyndall Gore M.D.   On: 10/24/2023 14:59   CT HEAD CODE STROKE WO CONTRAST Result Date: 10/24/2023 CLINICAL DATA:  Code stroke. Neuro deficit, acute, stroke suspected right-sided weakness EXAM: CT HEAD WITHOUT CONTRAST TECHNIQUE: Contiguous axial images were obtained from the base of the skull through the vertex without intravenous contrast. RADIATION DOSE REDUCTION: This exam was performed according to the departmental dose-optimization program which includes automated exposure control, adjustment of the mA and/or kV according to patient size and/or use of iterative reconstruction technique. COMPARISON:  None Available. FINDINGS: Brain: No hemorrhage. No hydrocephalus. No extra-axial fluid collection. No CT evidence of an acute cortical infarct. No mass effect. No mass lesion. There is a background of moderate chronic microvascular ischemic change. Vascular: No hyperdense vessel or unexpected calcification. Skull: Normal. Negative for fracture or focal lesion. Sinuses/Orbits: No middle ear or mastoid effusion. Paranasal sinuses are clear. Bilateral lens replacement. Orbits are otherwise unremarkable. Other: None. ASPECTS [facility] Stroke Program Early CT Score): 10 IMPRESSION: No hemorrhage or CT evidence of an acute cortical infarct. Aspects is 10. Findings were  paged with Dr. Vanessa on 10/24/23 at 2:15 PM. Electronically Signed   By: Lyndall Gore M.D.   On: 10/24/2023 14:16    Vitals:   10/25/23 0600 10/25/23 0630 10/25/23 0700 10/25/23 0800  BP: (!) 119/57 (!) 123/56 120/68 97/64  Pulse: 66 61 71 72  Resp: 13 12 13 19   Temp:    98.2 F (36.8 C)  TempSrc:    Axillary  SpO2: 93% 94% 91% 97%  Weight:      Height:         PHYSICAL EXAM General:  Alert, well-nourished, well-developed patient in no acute distress Psych:  Mood and affect appropriate for situation CV: Regular rate and rhythm on monitor Respiratory:  Regular, unlabored respirations on room air GI: Abdomen soft and nontender   NEURO:  Mental Status: AA&Ox3, patient is able to give clear and coherent history Speech/Language: Mild dysarthria present Naming, repetition, fluency, and comprehension intact.  No evidence of aphasia or neglect  Cranial Nerves:  II: PERRL. Visual fields full.  III, IV, VI: EOMI. Eyelids elevate symmetrically.  V: Sensation is decreased to right face VII: Right lower facial weakness VIII: hearing intact to voice. IX, X: Palate elevates symmetrically. Phonation is normal.  KP:Dynloizm shrug 5/5. XII: tongue is midline without fasciculations. Motor: 4-/5 RUE with slight drift 4/5 RLE with no drift.  Tone: is normal and bulk is normal Sensation-decreased to right arm and leg Coordination: FTN i decreased on right, no overt ataxia noted.  No tremor. Gait- deferred  Most Recent NIH: 4    ASSESSMENT/PLAN  Stroke:  likely left subcortical infarct s/p TNK, etiology: Pending full stroke workup, likely small vessel disease Code Stroke CT head:  No acute abnormality.  CTA head & neck: No LVO MRI: PENDING 2D Echo: PENDING LDL 196 HgbA1c 5.8 UDS pending VTE prophylaxis -SCDs No antithrombotic prior to admission, now on No antithrombotic PENDING 24 hour post-TNK imaging.  If imaging is stable, patient will likely start on aspirin  and Plavix   for 3 weeks then aspirin  alone Therapy recommendations:  Pending Disposition: Pending  Hypertension Home meds:  none Stable Blood Pressure Goal: BP less than 180/105  Labetalol IVP, Cleviprex gtt if needed Long term BP goal normotensive  Hyperlipidemia Home meds:  none LDL 196, goal < 70 Add Zetia  due to prior history of myalgia with statin (crestor and zocor) use in 1990s and 2011 Will discuss Leqvio with patient Continue Zetia  at discharge, pending approval of Leqvio  Other stroke risk factors Advanced age  Other medical issues Herpes  Cataract  Mild leukocytosis WBC 10.8 Pressure on urination - check UA  Hospital day # 1   Pt seen by Neuro NP/APP and later by MD. Note/plan to be edited by MD as needed.    Rocky JAYSON Likes, DNP, AGACNP-BC [facility] Please use AMION for contact information & EPIC for messaging.  ATTENDING NOTE: I reviewed above note and agree with the assessment and plan. Pt was seen and examined.   Son and RN are at the bedside. Pt lying in bed, AAO x 3, no aphasia, fluent language, following all simple commands, able to name and repeat. No visual field deficit, no gaze palsy, right facial droop. Tongue protrusion to the right. RUE 3-/5 and RLE 4/5. LUE and LLE 5/5. Sensation decreased on the right LE. FTN no ataxia but right FTN slow and incomplete.   Pt stroke most likely left brain subcortical, likely small vessel disease. Risk factor including uncontrolled HLD. BP stable and within goal. Having side effects with statin in the past, now on Zetia , will consider leqvio. Pending MRI and echo today. Complaining of pressure on urination, will check UA.   For detailed assessment and plan, please refer to above/below as I have made changes wherever appropriate.   Ary Cummins, MD PhD Stroke Neurology 10/25/2023 2:31 PM  This patient is critically ill due to stroke s/p TNK and at significant risk of neurological worsening, death form recurrent  stroke, hemorrhagic conversion and bleeding from TNK. This patient's care requires constant monitoring of vital signs, hemodynamics, respiratory and cardiac monitoring, review of multiple databases, neurological assessment, discussion with family, other specialists and medical decision making of high complexity. I spent 40 minutes of neurocritical care time in the care of this patient. I had long discussion with son and pt at bedside, updated pt current condition, treatment plan and potential prognosis, and answered all the questions. They expressed understanding and appreciation.      To contact Stroke Continuity provider, please refer to [email or web address]. After hours, contact General Neurology

## 2023-10-25 NOTE — Evaluation (Signed)
 Physical Therapy Evaluation Patient Details Name: Jordan Collier MRN: [id number] DOB: 05/26/1946 Today's Date: 10/25/2023  History of Present Illness  Pt is a 78 y/o female presenting 1/3 with sudden onset of R sided weakness.  TNK administered.  MRI pending.  No hx on file.  Clinical Impression  Pt admitted with/for s/s of stroke with R sided weakness.  Pt presently at a moderate assist level of 1-2 persons for basic mobility.  Pt currently limited functionally due to the problems listed. ( See problems list.)   Pt will benefit from PT to maximize function and safety in order to get ready for next venue listed below.         If plan is discharge home, recommend the following: A little help with walking and/or transfers;A lot of help with bathing/dressing/bathroom;Assistance with cooking/housework;Assist for transportation   Can travel by private vehicle        Equipment Recommendations Other (comment) (TBD)  Recommendations for Other Services  Rehab consult    Functional Status Assessment Patient has had a recent decline in their functional status and demonstrates the ability to make significant improvements in function in a reasonable and predictable amount of time.     Precautions / Restrictions Precautions Precautions: Fall      Mobility  Bed Mobility Overal bed mobility: Needs Assistance Bed Mobility: Sit to Sidelying         Sit to sidelying: Mod assist General bed mobility comments: cues for direction/technique, truncal assist for control and mod assist for LE's.    Transfers Overall transfer level: Needs assistance Equipment used: 1 person hand held assist Transfers: Sit to/from Stand, Bed to chair/wheelchair/BSC Sit to Stand: Mod assist   Step pivot transfers: Mod assist (over 2 feet.)       General transfer comment: face to face assist for stability with assist forward and boost.  w/shift assist and assist to facilitate stepping with R LE.     Ambulation/Gait               General Gait Details: pivotal steps only with moderate assist.  Stairs            Wheelchair Mobility     Tilt Bed    Modified Rankin (Stroke Patients Only) Modified Rankin (Stroke Patients Only) Pre-Morbid Rankin Score: No symptoms Modified Rankin: Severe disability     Balance Overall balance assessment: Needs assistance   Sitting balance-Leahy Scale: Fair Sitting balance - Comments: needs UE assist for challenged balance.   Standing balance support: Bilateral upper extremity supported, During functional activity Standing balance-Leahy Scale: Poor Standing balance comment: pt reliant on external support in standing/ pre-gait activity.                             Pertinent Vitals/Pain Pain Assessment Pain Assessment: No/denies pain    Home Living Family/patient expects to be discharged to:: Private residence Living Arrangements: Alone Available Help at Discharge:  (lives at [facility]) Type of Home: Apartment Home Access:  (incline entrance only)       Home Layout: One level Home Equipment: None      Prior Function Prior Level of Function : Independent/Modified Independent;Working/employed;Driving                     Extremity/Trunk Assessment   Upper Extremity Assessment Upper Extremity Assessment: Defer to OT evaluation (R hand with weakness and incoordination,  mild inattention to the R  hand)    Lower Extremity Assessment Lower Extremity Assessment: RLE deficits/detail (R LE weakness and incoordination, mild parasthesias R side) RLE Coordination: decreased fine motor       Communication   Communication Communication: No apparent difficulties Cueing Techniques: Verbal cues  Cognition Arousal: Alert   Overall Cognitive Status: Within Functional Limits for tasks assessed                                          General Comments General comments (skin integrity,  edema, etc.): vss    Exercises     Assessment/Plan    PT Assessment Patient needs continued PT services  PT Problem List Decreased strength;Decreased balance;Decreased mobility;Decreased coordination;Decreased knowledge of use of DME       PT Treatment Interventions Gait training;DME instruction;Functional mobility training;Therapeutic activities;Balance training;Neuromuscular re-education;Patient/family education;Cognitive remediation    PT Goals (Current goals can be found in the Care Plan section)  Acute Rehab PT Goals Patient Stated Goal: Independent PT Goal Formulation: With patient Time For Goal Achievement: 11/08/23 Potential to Achieve Goals: Good    Frequency Min 1X/week     Co-evaluation               AM-PAC PT 6 Clicks Mobility  Outcome Measure Help needed turning from your back to your side while in a flat bed without using bedrails?: A Lot Help needed moving from lying on your back to sitting on the side of a flat bed without using bedrails?: A Lot Help needed moving to and from a bed to a chair (including a wheelchair)?: A Lot Help needed standing up from a chair using your arms (e.g., wheelchair or bedside chair)?: A Lot Help needed to walk in hospital room?: Total Help needed climbing 3-5 steps with a railing? : Total 6 Click Score: 10    End of Session   Activity Tolerance: Patient tolerated treatment well Patient left: in bed;with call bell/phone within reach (tech for ECHO remained.) Nurse Communication: Mobility status PT Visit Diagnosis: Unsteadiness on feet (R26.81);Other symptoms and signs involving the nervous system (R29.898);Hemiplegia and hemiparesis Hemiplegia - Right/Left: Right Hemiplegia - dominant/non-dominant: Dominant Hemiplegia - caused by: Cerebral infarction    Time: 8853-8783 PT Time Calculation (min) (ACUTE ONLY): 30 min   Charges:   PT Evaluation $PT Eval Moderate Complexity: 1 Mod PT Treatments $Neuromuscular  Re-education: 8-22 mins PT General Charges $$ ACUTE PT VISIT: 1 Visit         10/25/2023  [place] HERO., PT Acute Rehabilitation Services [phone]  (office)  Vinie GAILS Nanda Bittick 10/25/2023, 1:02 PM

## 2023-10-25 NOTE — Evaluation (Signed)
 Speech Language Pathology Evaluation Patient Details Name: Jordan Collier MRN: [id number] DOB: 1946/07/03 Today's Date: 10/25/2023 Time: 8884-8863 SLP Time Calculation (min) (ACUTE ONLY): 21 min  Problem List:  Patient Active Problem List   Diagnosis Date Noted   Stroke determined by clinical assessment (HCC) 10/24/2023   Past Medical History: No past medical history on file. HPI:  Jordan Collier is a 78 y.o. female with history of hypercholesterolemia, herpes simplex, cataract who presents with sudden onset right-sided weakness.  Patient reports that she was at work, she stepped outside to take a break and sat in her car at 10 AM this morning.  When she tried to get out of her car at 1015, she noted that her right leg was giving her significant trouble.  She thought initially that she sat on it and had a pinched nerve but when the symptoms persisted, she called EMS and was brought in as a code stroke with right-sided weakness. CT head negative, but clinical symptoms observed and pt given TNK with reported symptom improvement.  ST consulted for speech/language assessment.   Assessment / Plan / Recommendation Clinical Impression  Pt seen for speech/language assessment with dynamic assessment/portions of WAB/[employer] Mental Status Examination (SLUMS) administered, but not completed, so a standard score was not obtained.  OME exhibited slight R facial/labial asymmetry with pt noting drooling during tasks which required increased focus (ie: talking on phone)(this was not observed during evaluation), but no dysphagia noted by pt.  Pt passed [employer] Screen.  Speech 75% intelligible within conversation with imprecise articulation and slightly lower vocal intensity observed during speaking tasks.  Pt appears to exhibit normal cognitive abilities as she was aware of deficits, oriented x4, able to problem solve situations and executive  functioning WFL.  Auditory comprehension tasks  adequate, but reading/writing tasks not completed during assessment and may be assessed at later date.  Pt highly motivated to return to baseline and agreeable to CIR.  Recommend ST f/u in acute setting for above mentioned deficits and f/u recommended at next venue of care.  Thank you for this consult.    SLP Assessment  SLP Recommendation/Assessment: Patient needs continued Speech Language Pathology Services SLP Visit Diagnosis: Dysarthria and anarthria (R47.1);Aphasia (R47.01)    Recommendations for follow up therapy are one component of a multi-disciplinary discharge planning process, led by the attending physician.  Recommendations may be updated based on patient status, additional functional criteria and insurance authorization.    Follow Up Recommendations  Acute inpatient rehab (3hours/day)    Assistance Recommended at Discharge  Intermittent Supervision/Assistance  Functional Status Assessment Patient has had a recent decline in their functional status and demonstrates the ability to make significant improvements in function in a reasonable and predictable amount of time.  Frequency and Duration min 2x/week  1 week      SLP Evaluation Cognition  Overall Cognitive Status: Within Functional Limits for tasks assessed Arousal/Alertness: Awake/alert Orientation Level: Oriented X4 Year: 2025 Month: January Day of Week: Correct Attention: Sustained Sustained Attention: Appears intact Memory: Appears intact Awareness: Appears intact Problem Solving: Appears intact Safety/Judgment: Appears intact       Comprehension  Auditory Comprehension Overall Auditory Comprehension: Appears within functional limits for tasks assessed Conversation: Complex Visual Recognition/Discrimination Discrimination: Not tested Reading Comprehension Reading Status: Not tested    Expression Expression Primary Mode of Expression: Verbal Verbal Expression Overall Verbal Expression:  Impaired Initiation: No impairment Level of Generative/Spontaneous Verbalization: Conversation Repetition: No impairment Naming: Impairment Responsive: 76-100% accurate Confrontation:  Within functional limits Convergent: Not tested Divergent: 75-100% accurate Other Naming Comments: Patient denotes impaired word finding within conversation Pragmatics: No impairment Non-Verbal Means of Communication: Not applicable Written Expression Dominant Hand: Left Written Expression: Not tested   Oral / Motor  Oral Motor/Sensory Function Overall Oral Motor/Sensory Function: Mild impairment Facial ROM: Reduced right Facial Symmetry: Abnormal symmetry right Facial Sensation: Reduced right Lingual ROM: Within Functional Limits Lingual Symmetry: Within Functional Limits Lingual Strength: Within Functional Limits Lingual Sensation: Within Functional Limits Velum: Within Functional Limits Mandible: Within Functional Limits Motor Speech Overall Motor Speech: Appears within functional limits for tasks assessed Respiration: Within functional limits Phonation: Normal;Low vocal intensity;Other (comment) (slight) Resonance: Within functional limits Articulation: Impaired Level of Impairment: Phrase Intelligibility: Intelligibility reduced Word: 75-100% accurate Phrase: 75-100% accurate Sentence: 75-100% accurate Conversation: 75-100% accurate Motor Planning: Witnin functional limits Motor Speech Errors: Not applicable            Pat Avner Stroder,M.S.,CCC-SLP 10/25/2023, 11:54 AM

## 2023-10-25 NOTE — Progress Notes (Signed)
 Echocardiogram 2D Echocardiogram has been performed.  Jordan Collier 10/25/2023, 2:57 PM

## 2023-10-25 NOTE — Progress Notes (Signed)

## 2023-10-26 ENCOUNTER — Other Ambulatory Visit: Payer: Self-pay

## 2023-10-26 ENCOUNTER — Encounter (HOSPITAL_COMMUNITY): Payer: Self-pay | Admitting: Neurology

## 2023-10-26 LAB — BASIC METABOLIC PANEL
Anion gap: 10 (ref 5–15)
BUN: 11 mg/dL (ref 8–23)
CO2: 21 mmol/L — ABNORMAL LOW (ref 22–32)
Calcium: 9 mg/dL (ref 8.9–10.3)
Chloride: 104 mmol/L (ref 98–111)
Creatinine, Ser: 0.74 mg/dL (ref 0.44–1.00)
GFR, Estimated: >60 mL/min (ref >60)
Glucose, Bld: 110 mg/dL — ABNORMAL HIGH (ref 70–99)
Potassium: 4.2 mmol/L (ref 3.5–5.1)
Sodium: 135 mmol/L (ref 135–145)

## 2023-10-26 LAB — CBC
HCT: 38.4 % (ref 36.0–46.0)
Hemoglobin: 12.7 g/dL (ref 12.0–15.0)
MCH: 27.4 pg (ref 26.0–34.0)
MCHC: 33.1 g/dL (ref 30.0–36.0)
MCV: 82.9 fL (ref 80.0–100.0)
Platelets: 317 10*3/uL (ref 150–400)
RBC: 4.63 MIL/uL (ref 3.87–5.11)
RDW: 14.6 % (ref 11.5–15.5)
WBC: 9.8 10*3/uL (ref 4.0–10.5)
nRBC: 0 % (ref 0.0–0.2)

## 2023-10-26 LAB — RAPID URINE DRUG SCREEN, HOSP PERFORMED
Amphetamines: NOT DETECTED
Barbiturates: NOT DETECTED
Benzodiazepines: NOT DETECTED
Cocaine: NOT DETECTED
Opiates: NOT DETECTED
Tetrahydrocannabinol: NOT DETECTED

## 2023-10-26 MED ORDER — ENOXAPARIN SODIUM 40 MG/0.4ML IJ SOSY
40.0000 mg | PREFILLED_SYRINGE | INTRAMUSCULAR | Status: DC
  Administered 2023-10-26 – 2023-10-28 (×3): 40 mg via SUBCUTANEOUS
  Filled 2023-10-26 (×3): qty 0.4

## 2023-10-26 NOTE — Plan of Care (Signed)
  Problem: Ischemic Stroke/TIA Tissue Perfusion: Goal: Complications of ischemic stroke/TIA will be minimized Outcome: Progressing   Problem: Self-Care: Goal: Ability to participate in self-care as condition permits will improve Outcome: Progressing   Problem: Activity: Goal: Risk for activity intolerance will decrease Outcome: Progressing   Problem: Pain Management: Goal: General experience of comfort will improve Outcome: Progressing

## 2023-10-26 NOTE — IPAL (Signed)
  CODE STATUS discussed with patient and RN.  Patient states she is a DNR and that it is recorded at her facility, [facility].   DNR orders have been placed in chart to reflect patient's wishes.   Rocky JAYSON Likes, DNP, AGACNP-BC [facility] Please use AMION for contact information & EPIC for messaging.

## 2023-10-26 NOTE — Progress Notes (Addendum)
 STROKE TEAM PROGRESS NOTE   BRIEF HPI [name] is a 78 y.o. female with history of hypercholesterolemia, herpes simplex, cataracts presenting 1/3 with acute onset of right-sided weakness. Patient called EMS after she had trouble getting out of her car due to her right leg weakness. CODE STROKE was activated by EMS.  Denies any hitroy of strokes, headaches, does not take blood thinners. CTH negative. Patient was unable to walk on assessment, even leaning to right while trying to simply sit up. Management with thrombolytic therapy was explained to the patient, or patient's representative, as were risks, benefits and alternatives. All questions were answered. Patient, or patient's representative, expressed understanding of the treatment plan and agreed to proceed with thrombolytic treatment. LKW: 1000 1/3 mRs: 0 IVT: Yes, 1/3 1414 EVT: No, no LVO  NIH on Admission: 5  INTERIM HISTORY/SUBJECTIVE  Patient sitting p in chair. RN at bedside.  Overall neurological exam improved. Right leg weakness greater than right arm, but improved.   Patients code status changed to DNR, per her wishes. Noted that this is documented at her facility, [facility].   Discharge planning for CIR, will transfer out of ICU today.   OBJECTIVE  CBC    Component Value Date/Time   WBC 9.8 10/26/2023 0425   RBC 4.63 10/26/2023 0425   HGB 12.7 10/26/2023 0425   HCT 38.4 10/26/2023 0425   PLT 317 10/26/2023 0425   MCV 82.9 10/26/2023 0425   MCH 27.4 10/26/2023 0425   MCHC 33.1 10/26/2023 0425   RDW 14.6 10/26/2023 0425   LYMPHSABS 2.9 10/24/2023 1358   MONOABS 1.0 10/24/2023 1358   EOSABS 0.3 10/24/2023 1358   BASOSABS 0.1 10/24/2023 1358    BMET    Component Value Date/Time   NA 135 10/26/2023 0425   K 4.2 10/26/2023 0425   CL 104 10/26/2023 0425   CO2 21 (L) 10/26/2023 0425   GLUCOSE 110 (H) 10/26/2023 0425   BUN 11 10/26/2023 0425   CREATININE 0.74 10/26/2023 0425   CALCIUM 9.0  10/26/2023 0425   GFRNONAA >60 10/26/2023 0425    IMAGING past 24 hours ECHOCARDIOGRAM COMPLETE Result Date: 10/25/2023    ECHOCARDIOGRAM REPORT   Patient Name:   Jordan Collier Date of Exam: 10/25/2023 Medical Rec #:  [id number]         Height:       68.0 in Accession #:    [id number]        Weight:       163.6 lb Date of Birth:  Sep 06, 1946         BSA:          1.876 m Patient Age:    77 years          BP:           97/64 mmHg Patient Gender: F                 HR:           80 bpm. Exam Location:  Inpatient Procedure: 2D Echo, 3D Echo, Cardiac Doppler and Color Doppler Indications:   Stroke I63.9  History:       Patient has no prior history of Echocardiogram examinations.                Stroke.  Sonographer:   Thea Norlander RCS Referring      [facility] Phys: IMPRESSIONS  1. Left ventricular ejection fraction, by estimation, is 65 to 70%. The left ventricle  has normal function. The left ventricle has no regional wall motion abnormalities. Left ventricular diastolic parameters are consistent with Grade II diastolic dysfunction (pseudonormalization).  2. Right ventricular systolic function is normal. The right ventricular size is normal. There is normal pulmonary artery systolic pressure. The estimated right ventricular systolic pressure is 28.8 mmHg.  3. Left atrial size was upper normal.  4. The mitral valve is degenerative. Mild to moderate mitral valve regurgitation.  5. The aortic valve is tricuspid. There is mild calcification of the aortic valve. Aortic valve regurgitation is mild to moderate. Aortic valve sclerosis/calcification is present, without any evidence of aortic stenosis. Aortic regurgitation PHT measures 486 msec.  6. The inferior vena cava is normal in size with greater than 50% respiratory variability, suggesting right atrial pressure of 3 mmHg. Comparison(s): No prior Echocardiogram. FINDINGS  Left Ventricle: Left ventricular ejection fraction, by estimation, is 65 to 70%. The  left ventricle has normal function. The left ventricle has no regional wall motion abnormalities. The left ventricular internal cavity size was normal in size. There is  no left ventricular hypertrophy. Left ventricular diastolic parameters are consistent with Grade II diastolic dysfunction (pseudonormalization). Right Ventricle: The right ventricular size is normal. No increase in right ventricular wall thickness. Right ventricular systolic function is normal. There is normal pulmonary artery systolic pressure. The tricuspid regurgitant velocity is 2.54 m/s, and  with an assumed right atrial pressure of 3 mmHg, the estimated right ventricular systolic pressure is 28.8 mmHg. Left Atrium: Left atrial size was upper normal. Right Atrium: Right atrial size was normal in size. Pericardium: There is no evidence of pericardial effusion. Mitral Valve: The mitral valve is degenerative in appearance. Mild to moderate mitral valve regurgitation. Tricuspid Valve: The tricuspid valve is grossly normal. Tricuspid valve regurgitation is trivial. Aortic Valve: The aortic valve is tricuspid. There is mild calcification of the aortic valve. There is mild aortic valve annular calcification. Aortic valve regurgitation is mild to moderate. Aortic regurgitation PHT measures 486 msec. Aortic valve sclerosis/calcification is present, without any evidence of aortic stenosis. Aortic valve peak gradient measures 10.0 mmHg. Pulmonic Valve: The pulmonic valve was not well visualized. Pulmonic valve regurgitation is trivial. Aorta: The aortic root is normal in size and structure. Venous: The inferior vena cava is normal in size with greater than 50% respiratory variability, suggesting right atrial pressure of 3 mmHg. IAS/Shunts: No atrial level shunt detected by color flow Doppler.  LEFT VENTRICLE PLAX 2D LVIDd:         4.60 cm   Diastology LVIDs:         3.00 cm   LV e' medial:    9.90 cm/s LV PW:         0.70 cm   LV E/e' medial:  8.3 LV IVS:         0.70 cm   LV e' lateral:   8.49 cm/s LVOT diam:     2.10 cm   LV E/e' lateral: 9.6 LV SV:         83 LV SV Index:   44 LVOT Area:     3.46 cm                           3D Volume EF:                          3D EF:        70 %  LV EDV:       120 ml                          LV ESV:       36 ml                          LV SV:        84 ml RIGHT VENTRICLE             IVC RV S prime:     14.80 cm/s  IVC diam: 1.90 cm TAPSE (M-mode): 2.7 cm LEFT ATRIUM             Index        RIGHT ATRIUM           Index LA diam:        3.30 cm 1.76 cm/m   RA Area:     11.70 cm LA Vol (A2C):   46.6 ml 24.83 ml/m  RA Volume:   22.00 ml  11.72 ml/m LA Vol (A4C):   59.3 ml 31.60 ml/m LA Biplane Vol: 52.8 ml 28.14 ml/m  AORTIC VALVE AV Area (Vmax): 2.41 cm AV Vmax:        158.00 cm/s AV Peak Grad:   10.0 mmHg LVOT Vmax:      110.00 cm/s LVOT Vmean:     71.800 cm/s LVOT VTI:       0.239 m AI PHT:         486 msec  AORTA Ao Root diam: 3.50 cm MITRAL VALVE               TRICUSPID VALVE MV Area (PHT): 3.21 cm    TR Peak grad:   25.8 mmHg MV Decel Time: 236 msec    TR Vmax:        254.00 cm/s MR Peak grad: 137.4 mmHg MR Vmax:      586.00 cm/s  SHUNTS MV E velocity: 81.80 cm/s  Systemic VTI:  0.24 m MV A velocity: 80.70 cm/s  Systemic Diam: 2.10 cm MV E/A ratio:  1.01 Jayson Sierras MD Electronically signed by Jayson Sierras MD Signature Date/Time: 10/25/2023/4:13:22 PM    Final    MR BRAIN WO CONTRAST Result Date: 10/25/2023 CLINICAL DATA:  Neuro deficit, acute, stroke suspected. EXAM: MRI HEAD WITHOUT CONTRAST TECHNIQUE: Multiplanar, multiecho pulse sequences of the brain and surrounding structures were obtained without intravenous contrast. COMPARISON:  Head CT and CTA 10/24/2023 FINDINGS: Brain: There is a 1.5 cm acute infarct involving the lateral aspect of the left thalamus and adjacent posterior limb of the internal capsule. A single chronic microhemorrhage is noted in the right frontal white  matter. T2 hyperintensities in the cerebral white matter bilaterally are nonspecific but compatible with mild-to-moderate chronic small vessel ischemic disease. There is mild generalized cerebral atrophy. Vascular: Major intracranial vascular flow voids are preserved. Skull and upper cervical spine: Unremarkable bone marrow signal. Sinuses/Orbits: Bilateral cataract extraction. Paranasal sinuses and mastoid air cells are clear. Other: None. IMPRESSION: 1. Acute left thalamocapsular infarct. 2. 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 disease. Electronically Signed   By: Dasie Hamburg M.D.   On: 10/25/2023 14:45    Vitals:   10/26/23 0500 10/26/23 0600 10/26/23 0706 10/26/23 0800  BP: 134/61 115/63 (!) 144/71 (!) 143/61  Pulse: 64 63 80 69  Resp: 14 13 (!) 26 14  Temp:    98.1 F (36.7  C)  TempSrc:    Oral  SpO2: 97% 97% 95% 96%  Weight:      Height:         PHYSICAL EXAM General:  Alert, well-nourished, well-developed patient in no acute distress Psych:  Mood and affect appropriate for situation CV: Regular rate and rhythm on monitor Respiratory:  Regular, unlabored respirations on room air GI: Abdomen soft and nontender   NEURO:  Mental Status: AA&Ox3, patient is able to give clear and coherent history Speech/Language: minimal dysarthria present  Naming, repetition, fluency, and comprehension intact.  No evidence of aphasia or neglect  Cranial Nerves:  II: PERRL. Visual fields full.  III, IV, VI: EOMI. Eyelids elevate symmetrically.  V: sensation intact bilaterally to face VII: improved right lower facial weakness VIII: hearing intact to voice. IX, X: Palate elevates symmetrically. Phonation is normal.  KP:Dynloizm shrug 5/5. XII: tongue is midline  Motor: 4-/5 RUE with slight drift with 4-/5 grip and reduced coordination and fine motor movements.  3/5 RLE hip flexion with 4-/5 knee flexion, with no drift.  Tone: is normal and bulk is normal Sensation- decreased to  right leg Coordination: FTN decreased on right, no overt ataxia noted.  No tremor. Fine motor movements decreased in right hand.  Gait- deferred  Most Recent NIH: 5    ASSESSMENT/PLAN  Stroke:  Acute left PLIC infarct s/p TNK, etiology: small vessel disease Code Stroke CT head: No acute abnormality.  CTA head & neck: No LVO MRI: Acute left thalamocapsular infarct. 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 vessel disease 2D Echo: LVEF 65 to 70%  LDL 196 HgbA1c 5.8 UDS pending VTE prophylaxis - lovenox  No antithrombotic prior to admission, now on aspirin  and Plavix  for 3 weeks followed by aspirin  alone. Therapy recommendations:  CIR Disposition: CIR  Hypertension Home meds:  none Stable Blood Pressure Goal: BP less than 180/105  Labetalol IVP, Cleviprex gtt if needed Long term BP goal normotensive  Hyperlipidemia Home meds:  none LDL 196, goal < 70 Conitnue Zetia  due to prior history of myalgia with statin (crestor and zocor) use in 1990s and 2011 Consider Leqvio Continue Zetia  at discharge, pending approval of Leqvio  Other stroke risk factors Advanced age  Other medical issues Herpes  Cataract  Mild leukocytosis WBC 10.8, resolved Pressure on urination -  resolved  Hospital day # 2  Pt seen by Neuro NP/APP and later by MD. Note/plan to be edited by MD as needed.    Rocky JAYSON Likes, DNP, AGACNP-BC [facility] Please use AMION for contact information & EPIC for messaging.  ATTENDING NOTE: I reviewed above note and agree with the assessment and plan. Pt was seen and examined.   No family at the bedside. Pt reclining in bed, still has right hemiparesis, but right facial droop improving. Educated on working hard with PT/OT. MRI showed left PLIC infarct, on DAPT and zetia . LDL high, will consider Leqvio.   For detailed assessment and plan, please refer to above/below as I have made changes wherever appropriate.   Ary Cummins, MD PhD Stroke  Neurology 10/26/2023 4:34 PM  This patient is critically ill due to stroke s/p TNK and at significant risk of neurological worsening, death form recurrent stroke, hemorrhagic conversion and bleeding from TNK. This patient's care requires constant monitoring of vital signs, hemodynamics, respiratory and cardiac monitoring, review of multiple databases, neurological assessment, discussion with family, other specialists and medical decision making of high complexity. I spent 35 minutes of neurocritical care time in the care of  this patient.

## 2023-10-26 NOTE — Progress Notes (Signed)
 OT Cancellation Note  Patient Details Name: Jordan Collier MRN: [id number] DOB: Dec 04, 1945   Cancelled Treatment:    Reason Eval/Treat Not Completed: Patient at procedure or test/ unavailable Attempted to see pt for OT eval this afternoon. Pt currently unavailable. Will re-attempt at a later time.   Leita Howell, OTR/L,CBIS  Supplemental OT - [facility] and [facility] Secure Chat Preferred   10/26/2023, 3:58 PM

## 2023-10-27 DIAGNOSIS — G8191 Hemiplegia, unspecified affecting right dominant side: Secondary | ICD-10-CM | POA: Diagnosis not present

## 2023-10-27 DIAGNOSIS — E785 Hyperlipidemia, unspecified: Secondary | ICD-10-CM | POA: Insufficient documentation

## 2023-10-27 DIAGNOSIS — I639 Cerebral infarction, unspecified: Secondary | ICD-10-CM

## 2023-10-27 LAB — BASIC METABOLIC PANEL
Anion gap: 7 (ref 5–15)
BUN: 16 mg/dL (ref 8–23)
CO2: 23 mmol/L (ref 22–32)
Calcium: 8.8 mg/dL — ABNORMAL LOW (ref 8.9–10.3)
Chloride: 104 mmol/L (ref 98–111)
Creatinine, Ser: 0.86 mg/dL (ref 0.44–1.00)
GFR, Estimated: >60 mL/min (ref >60)
Glucose, Bld: 108 mg/dL — ABNORMAL HIGH (ref 70–99)
Potassium: 4.2 mmol/L (ref 3.5–5.1)
Sodium: 134 mmol/L — ABNORMAL LOW (ref 135–145)

## 2023-10-27 LAB — CBC
HCT: 38.7 % (ref 36.0–46.0)
Hemoglobin: 12.7 g/dL (ref 12.0–15.0)
MCH: 27.6 pg (ref 26.0–34.0)
MCHC: 32.8 g/dL (ref 30.0–36.0)
MCV: 84.1 fL (ref 80.0–100.0)
Platelets: 324 10*3/uL (ref 150–400)
RBC: 4.6 MIL/uL (ref 3.87–5.11)
RDW: 14.4 % (ref 11.5–15.5)
WBC: 11.1 10*3/uL — ABNORMAL HIGH (ref 4.0–10.5)
nRBC: 0 % (ref 0.0–0.2)

## 2023-10-27 NOTE — Progress Notes (Signed)
 Rash noted on patients right bottom cheek and lower back. Has not been bothering patient and there are not complaints of itchiness or soreness. Physician notified (Dr. Jerri and Majorie Last NP) and this RN was told to continue to monitor for continued redness or if the patient starts to complain of itchiness and discomfort.

## 2023-10-27 NOTE — PMR Pre-admission (Signed)
 PMR Admission Coordinator Pre-Admission Assessment  Patient: Jordan Collier is an 78 y.o., female MRN: [id number] DOB: [birth year] Height: 5' 8 (172.7 cm) Weight: 74.2 kg              Insurance Information HMO:     PPO: yes     PCP:      IPA:      80/20:      OTHER:  PRIMARY: Blue Medicare      Policy#: [id number]      Subscriber: pt CM Name: harlene      Phone#: [phone]     Fax#: [phone] Pre-Cert#: tbd on admit      Employer:  Benefits:  Phone #: [phone]     Name:  Eff. Date: 10/22/23     Deduct: $0      Out of Pocket Max: $3150 ($0 met)      Life Max:   CIR: $335/day for days 1-5      SNF: 20 full days Outpatient:      Co-Pay: $10/viist Home Health: 100%      Co-Pay:  DME: 80%     Co-Pay: 20% Providers:  SECONDARY:       Policy#:       Phone#:   Artist:       Phone#:   [employer] Information Summary" for patients in Inpatient Rehabilitation Facilities with attached "Privacy Act Statement-Health Care Records" was provided and verbally reviewed with: Patient  Emergency Contact Information Contact Information     Name Relation Home Work [place] Son [phone]  [phone]      Other Contacts   None on File    Current Medical History  Patient Admitting Diagnosis: CVA   History of Present Illness: Pt is a 78 y/o female with PMH of HLD who admitted to [facility] on 10/24/23 with c/o sudden onset R hemiparesis.  NIHSS 5.  Pt was given TNK.  CT head negative for hemorrhage, CTA head/neck negative for LVO.  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  Echo 65-70%, A1C 5.8.  Neurology recommended aspirin  and plavix  x3 weeks, then aspirin  alone.  Therapy evaluations were completed and pt was recommended for CIR.   Complete NIHSS TOTAL: 5 Glasgow Coma Scale Score: 15  Patient's medical record from [facility] has been reviewed by the rehabilitation admission coordinator and  physician.  Past Medical History  Past Medical History:  Diagnosis Date   Allergy to mold    Hypercholesterolemia    Psoriasis     Has the patient had major surgery during 100 days prior to admission? No  Family History  family history is not on file.   Current Medications   Current Facility-Administered Medications:    acetaminophen  (TYLENOL ) tablet 650 mg, 650 mg, Oral, Q4H PRN **OR** acetaminophen  (TYLENOL ) 160 MG/5ML solution 650 mg, 650 mg, Per Tube, Q4H PRN **OR** acetaminophen  (TYLENOL ) suppository 650 mg, 650 mg, Rectal, Q4H PRN, Khaliqdina, Salman, MD   aspirin  EC tablet 81 mg, 81 mg, Oral, Daily, Judithe, Erin C, NP, 81 mg at 10/27/23 0931   Chlorhexidine  Gluconate Cloth 2 % PADS 6 each, 6 each, Topical, Daily, Khaliqdina, Salman, MD, 6 each at 10/27/23 0931   clopidogrel  (PLAVIX ) tablet 75 mg, 75 mg, Oral, Daily, Judithe Longs C, NP, 75 mg at 10/27/23 0931   enoxaparin  (LOVENOX ) injection 40 mg, 40 mg, Subcutaneous, Q24H, Lehner, Erin C, NP, 40 mg at 10/27/23 1131   ezetimibe  (ZETIA )  tablet 10 mg, 10 mg, Oral, Daily, Lehner, Erin C, NP, 10 mg at 10/27/23 9068   ondansetron  (ZOFRAN ) injection 4 mg, 4 mg, Intravenous, Q8H PRN, Bhagat, Srishti L, MD, 4 mg at 10/24/23 1955   Oral care mouth rinse, 15 mL, Mouth Rinse, PRN, Khaliqdina, Salman, MD   senna-docusate (Senokot-S) tablet 1 tablet, 1 tablet, Oral, QHS PRN, Khaliqdina, Salman, MD  Patients Current Diet:  Diet Order             Diet Heart Room service appropriate? Yes; Fluid consistency: Thin  Diet effective now                   Precautions / Restrictions Precautions Precautions: Fall Restrictions Weight Bearing Restrictions Per Provider Order: No   Has the patient had 2 or more falls or a fall with injury in the past year?Yes  Prior Activity Level Community (5-7x/wk): fully independent, driving, working 32 hrs/week at [employer], no DME at baseline  Prior Functional Level Prior Function Prior Level of  Function : Independent/Modified Independent  Self Care: Did the patient need help bathing, dressing, using the toilet or eating?  Independent  Indoor Mobility: Did the patient need assistance with walking from room to room (with or without device)? Independent  Stairs: Did the patient need assistance with internal or external stairs (with or without device)? Independent  Functional Cognition: Did the patient need help planning regular tasks such as shopping or remembering to take medications? Independent  Patient Information Are you of Hispanic, Latino/a,or Spanish origin?: A. No, not of Hispanic, Latino/a, or Spanish origin What is your race?: A. White Do you need or want an interpreter to communicate with a doctor or health care staff?: 0. No  Patient's Response To:  Health Literacy and Transportation Is the patient able to respond to health literacy and transportation needs?: Yes Health Literacy - How often do you need to have someone help you when you read instructions, pamphlets, or other written material from your doctor or pharmacy?: Never In the past 12 months, has lack of transportation kept you from medical appointments or from getting medications?: No In the past 12 months, has lack of transportation kept you from meetings, work, or from getting things needed for daily living?: No  Home Assistive Devices / Equipment Home Equipment: None  Prior Device Use: Indicate devices/aids used by the patient prior to current illness, exacerbation or injury? None of the above  Current Functional Level Cognition  Arousal/Alertness: Awake/alert Overall Cognitive Status: Within Functional Limits for tasks assessed Orientation Level: Oriented X4 Attention: Sustained Sustained Attention: Appears intact Memory: Appears intact Awareness: Appears intact Problem Solving: Appears intact Safety/Judgment: Appears intact    Extremity Assessment (includes Sensation/Coordination)  Upper  Extremity Assessment: RUE deficits/detail RUE Deficits / Details: AROM full ROM, pt with decreased coordinated and fine motor RUE Sensation: decreased proprioception, decreased light touch RUE Coordination: decreased gross motor, decreased fine motor  Lower Extremity Assessment: Defer to PT evaluation RLE Coordination: decreased fine motor    ADLs  Overall ADL's : Needs assistance/impaired Eating/Feeding: Set up, Sitting Toilet Transfer: Moderate assistance, Stand-pivot, BSC/3in1, Grab bars Toilet Transfer Details (indicate cue type and reason): using L UE on bar to stand pivot Toileting- Clothing Manipulation and Hygiene: Maximal assistance Toileting - Clothing Manipulation Details (indicate cue type and reason): static standing Functional mobility during ADLs: +2 for physical assistance, Moderate assistance, Rolling walker (2 wheels) General ADL Comments: pt progressed from chair to [facility] over toilet using grab bar  and back to chair. pt progressed with ambultion with OT /PT assistance. PT arriving for post bathroom transfer    Mobility  Overal bed mobility: Needs Assistance Bed Mobility: Sit to Sidelying Sit to sidelying: Mod assist General bed mobility comments: oob in chair on arrival    Transfers  Overall transfer level: Needs assistance Equipment used: Rolling walker (2 wheels) Transfers: Sit to/from Stand, Bed to chair/wheelchair/BSC Sit to Stand: Mod assist Bed to/from chair/wheelchair/BSC transfer type:: Stand pivot Stand pivot transfers: Mod assist Step pivot transfers: Mod assist (over 2 feet.) General transfer comment: cues for hand placement and correct sequencing for normalized movement    Ambulation / Gait / Stairs / Wheelchair Mobility  Ambulation/Gait Ambulation/Gait assistance: Mod assist, +2 physical assistance, +2 safety/equipment Gait Distance (Feet): 15 Feet (x1 and 10 feet x1) Assistive device: Rolling walker (2 wheels) Gait Pattern/deviations: Step-to  pattern, Decreased step length - left, Decreased step length - right, Decreased stance time - right, Decreased stride length General Gait Details: paretic gait on the R, weak R knee extension, difficult w/shift L and generally uncoodinated movement, but that said, pt was focus on task and worked hard to fatigue on proper sequencing, improving on all the facets descibed above. Gait velocity interpretation: <1.31 ft/sec, indicative of household ambulator    Posture / Balance Dynamic Sitting Balance Sitting balance - Comments: needs UE assist for challenged balance. Balance Overall balance assessment: Needs assistance Sitting-balance support: Bilateral upper extremity supported, Feet supported Sitting balance-Leahy Scale: Fair Sitting balance - Comments: needs UE assist for challenged balance. Standing balance support: Bilateral upper extremity supported, During functional activity, Reliant on assistive device for balance Standing balance-Leahy Scale: Poor Standing balance comment: pt reliant on external support in standing/ pre-gait activity.    Special needs/care consideration Diabetic management pre-diabetes     Previous Home Environment (from acute therapy documentation) Living Arrangements: Alone  Lives With: Alone Available Help at Discharge:  (lives at [facility]) Type of Home: Apartment Home Layout: One level Home Access:  (incline entrance only) Bathroom Shower/Tub: Health Visitor: Pharmacist, Community: Yes Home Care Services: No  Discharge Living Setting Plans for Discharge Living Setting: Patient's home, Apartment (ILF) Type of Home at Discharge: Independent living facility Care Facility Name at Discharge: [facility] Discharge Home Layout: One level Discharge Home Access: Level entry Discharge Bathroom Shower/Tub: Walk-in shower Discharge Bathroom Toilet: Handicapped height Discharge Bathroom Accessibility: Yes How Accessible:  Accessible via walker Does the patient have any problems obtaining your medications?: No  Social/Family/Support Systems Anticipated Caregiver: mod I goals per PMR consult, contact is her son, Donnice Anticipated Industrial/product Designer Information: [phone] Ability/Limitations of Caregiver: n/a Caregiver Availability: Other (Comment) Discharge Plan Discussed with Primary Caregiver: Yes Is Caregiver In Agreement with Plan?: Yes Does Caregiver/Family have Issues with Lodging/Transportation while Pt is in Rehab?: No   Goals Patient/Family Goal for Rehab: PT/OT/SLP mod I Expected length of stay: 12-16 days Additional Information: Discharge plan: mod I goals, return to [employer] at discharge Pt/Family Agrees to Admission and willing to participate: Yes Program Orientation Provided & Reviewed with Pt/Caregiver Including Roles  & Responsibilities: Yes   Decrease burden of Care through IP rehab admission: n/a   Possible need for SNF placement upon discharge:not anticipated, plan for d/c back to [facility] (ILF) at mod I level.    Patient Condition: This patient's condition remains as documented in the consult dated 10/27/23, in which the Rehabilitation Physician determined and documented that the patient's condition is appropriate  for intensive rehabilitative care in an inpatient rehabilitation facility. Will admit to inpatient rehab today.  Preadmission Screen Completed By:  Reche FORBES Lowers, PT, DPT 10/27/2023 3:52 PM ______________________________________________________________________   Discussed status with Dr. Babs on 10/28/23 at  10:25 AM  and received approval for admission today.  Admission Coordinator:  Caitlin E Warren, PT, DPT time 10:25 AM Pattricia 10/28/23

## 2023-10-27 NOTE — Discharge Summary (Addendum)
 Stroke Discharge Summary  Patient ID: Jordan Collier   MRN: [id number]      DOB: 1946/06/01  Date of Admission: 10/24/2023 Date of Discharge: 10/28/2023  Attending Physician:  Jerri Pfeiffer MD Consultant(s):    None  Patient's PCP:  System, Provider Not In  DISCHARGE PRIMARY DIAGNOSIS:  Stroke:  Acute left PLIC infarct s/p TNK, etiology: small vessel disease    Secondary diagnosis HTN HLD Herpes   Patient Active Problem List   Diagnosis Date Noted   Hyperlipemia 10/27/2023   Stroke determined by clinical assessment (HCC) 10/24/2023   Allergies as of 10/28/2023       Reactions   Decadron [dexamethasone] Rash   Molds & Smuts Other (See Comments)   Mucus over production   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 Other (See Comments)   Mucus over production   Penicillins Other (See Comments)   Unknown reaction Childhood allergy   Sulfa Antibiotics Other (See Comments)   Unknown reaction Childhood allergy   Lanolin Swelling   Lip swelling when using lip balm containing lanolin.   Statins Other (See Comments)   Myalgias Vision changes Severe constipation        Medication List     TAKE these medications    acetaminophen  500 MG tablet Commonly known as: TYLENOL  Take 500 mg by mouth daily as needed for moderate pain (pain score 4-6), fever or headache.   aspirin  EC 81 MG tablet Take 1 tablet (81 mg total) by mouth daily. Swallow whole. Start taking on: October 29, 2023   clopidogrel  75 MG tablet Commonly known as: PLAVIX  Take 1 tablet (75 mg total) by mouth daily for 21 doses. Start taking on: October 29, 2023   ezetimibe  10 MG tablet Commonly known as: ZETIA  Take 1 tablet (10 mg total) by mouth daily. Start taking on: October 29, 2023   KLS Aller-Tec 10 MG tablet Generic drug: cetirizine Take 10 mg by mouth daily as needed for allergies (sinus congestion).   mineral oil-hydrophilic petrolatum  ointment Apply 1 Application topically as needed (lip irritation).    Multivitamin Women 50+ Tabs Take 1 tablet by mouth daily.   ondansetron  4 MG/2ML Soln injection Commonly known as: ZOFRAN  Inject 2 mLs (4 mg total) into the vein every 8 (eight) hours as needed for nausea or vomiting.   senna-docusate 8.6-50 MG tablet Commonly known as: Senokot-S Take 1 tablet by mouth at bedtime as needed for mild constipation or moderate constipation.   sodium chloride  5 % ophthalmic solution Commonly known as: MURO 128 Place 1 drop into both eyes See admin instructions. Apply 1 drop into each eye up to 5 times daily.   valACYclovir  1000 MG tablet Commonly known as: VALTREX  Take 2,000 mg by mouth See admin instructions. Take 2 tablets (2000mg ) twice daily for 1 day with onset of cold sores.   VITAMIN B-12 PO Take 0.5 tablets by mouth daily.   VITAMIN D -3 PO Take 1 tablet by mouth daily.        LABORATORY STUDIES CBC    Component Value Date/Time   WBC 10.2 10/28/2023 0533   RBC 4.77 10/28/2023 0533   HGB 13.1 10/28/2023 0533   HCT 39.6 10/28/2023 0533   PLT 335 10/28/2023 0533   MCV 83.0 10/28/2023 0533   MCH 27.5 10/28/2023 0533   MCHC 33.1 10/28/2023 0533   RDW 14.4 10/28/2023 0533   LYMPHSABS 2.9 10/24/2023 1358   MONOABS 1.0 10/24/2023 1358   EOSABS 0.3 10/24/2023 1358  BASOSABS 0.1 10/24/2023 1358   CMP    Component Value Date/Time   NA 135 10/28/2023 0533   K 4.2 10/28/2023 0533   CL 104 10/28/2023 0533   CO2 23 10/28/2023 0533   GLUCOSE 101 (H) 10/28/2023 0533   BUN 10 10/28/2023 0533   CREATININE 0.73 10/28/2023 0533   CALCIUM 9.0 10/28/2023 0533   PROT 7.0 10/24/2023 1358   ALBUMIN 4.0 10/24/2023 1358   AST 25 10/24/2023 1358   ALT 30 10/24/2023 1358   ALKPHOS 68 10/24/2023 1358   BILITOT 0.6 10/24/2023 1358   GFRNONAA >60 10/28/2023 0533   COAGS Lab Results  Component Value Date   INR 1.0 10/24/2023   Lipid Panel    Component Value Date/Time   CHOL 291 (H) 10/25/2023 0452   TRIG 68 10/25/2023 0452   HDL 81  10/25/2023 0452   CHOLHDL 3.6 10/25/2023 0452   VLDL 14 10/25/2023 0452   LDLCALC 196 (H) 10/25/2023 0452   HgbA1C  Lab Results  Component Value Date   HGBA1C 5.8 (H) 10/24/2023   Alcohol  Level    Component Value Date/Time   ETH <10 10/24/2023 1358     SIGNIFICANT DIAGNOSTIC STUDIES ECHOCARDIOGRAM COMPLETE Result Date: 10/25/2023    ECHOCARDIOGRAM REPORT   Patient Name:   Daphyne Miguez Date of Exam: 10/25/2023 Medical Rec #:  [id number]         Height:       68.0 in Accession #:    [id number]        Weight:       163.6 lb Date of Birth:  [birth year]         BSA:          1.876 m Patient Age:    77 years          BP:           97/64 mmHg Patient Gender: F                 HR:           80 bpm. Exam Location:  Inpatient Procedure: 2D Echo, 3D Echo, Cardiac Doppler and Color Doppler Indications:   Stroke I63.9  History:       Patient has no prior history of Echocardiogram examinations.                Stroke.  Sonographer:   Thea Norlander RCS Referring      [facility] Phys: IMPRESSIONS  1. Left ventricular ejection fraction, by estimation, is 65 to 70%. The left ventricle has normal function. The left ventricle has no regional wall motion abnormalities. Left ventricular diastolic parameters are consistent with Grade II diastolic dysfunction (pseudonormalization).  2. Right ventricular systolic function is normal. The right ventricular size is normal. There is normal pulmonary artery systolic pressure. The estimated right ventricular systolic pressure is 28.8 mmHg.  3. Left atrial size was upper normal.  4. The mitral valve is degenerative. Mild to moderate mitral valve regurgitation.  5. The aortic valve is tricuspid. There is mild calcification of the aortic valve. Aortic valve regurgitation is mild to moderate. Aortic valve sclerosis/calcification is present, without any evidence of aortic stenosis. Aortic regurgitation PHT measures 486 msec.  6. The inferior vena cava is normal in size with  greater than 50% respiratory variability, suggesting right atrial pressure of 3 mmHg. Comparison(s): No prior Echocardiogram. FINDINGS  Left Ventricle: Left ventricular ejection fraction, by estimation, is 65 to 70%. The left ventricle  has normal function. The left ventricle has no regional wall motion abnormalities. The left ventricular internal cavity size was normal in size. There is  no left ventricular hypertrophy. Left ventricular diastolic parameters are consistent with Grade II diastolic dysfunction (pseudonormalization). Right Ventricle: The right ventricular size is normal. No increase in right ventricular wall thickness. Right ventricular systolic function is normal. There is normal pulmonary artery systolic pressure. The tricuspid regurgitant velocity is 2.54 m/s, and  with an assumed right atrial pressure of 3 mmHg, the estimated right ventricular systolic pressure is 28.8 mmHg. Left Atrium: Left atrial size was upper normal. Right Atrium: Right atrial size was normal in size. Pericardium: There is no evidence of pericardial effusion. Mitral Valve: The mitral valve is degenerative in appearance. Mild to moderate mitral valve regurgitation. Tricuspid Valve: The tricuspid valve is grossly normal. Tricuspid valve regurgitation is trivial. Aortic Valve: The aortic valve is tricuspid. There is mild calcification of the aortic valve. There is mild aortic valve annular calcification. Aortic valve regurgitation is mild to moderate. Aortic regurgitation PHT measures 486 msec. Aortic valve sclerosis/calcification is present, without any evidence of aortic stenosis. Aortic valve peak gradient measures 10.0 mmHg. Pulmonic Valve: The pulmonic valve was not well visualized. Pulmonic valve regurgitation is trivial. Aorta: The aortic root is normal in size and structure. Venous: The inferior vena cava is normal in size with greater than 50% respiratory variability, suggesting right atrial pressure of 3 mmHg.  IAS/Shunts: No atrial level shunt detected by color flow Doppler.  LEFT VENTRICLE PLAX 2D LVIDd:         4.60 cm   Diastology LVIDs:         3.00 cm   LV e' medial:    9.90 cm/s LV PW:         0.70 cm   LV E/e' medial:  8.3 LV IVS:        0.70 cm   LV e' lateral:   8.49 cm/s LVOT diam:     2.10 cm   LV E/e' lateral: 9.6 LV SV:         83 LV SV Index:   44 LVOT Area:     3.46 cm                           3D Volume EF:                          3D EF:        70 %                          LV EDV:       120 ml                          LV ESV:       36 ml                          LV SV:        84 ml RIGHT VENTRICLE             IVC RV S prime:     14.80 cm/s  IVC diam: 1.90 cm TAPSE (M-mode): 2.7 cm LEFT ATRIUM             Index  RIGHT ATRIUM           Index LA diam:        3.30 cm 1.76 cm/m   RA Area:     11.70 cm LA Vol (A2C):   46.6 ml 24.83 ml/m  RA Volume:   22.00 ml  11.72 ml/m LA Vol (A4C):   59.3 ml 31.60 ml/m LA Biplane Vol: 52.8 ml 28.14 ml/m  AORTIC VALVE AV Area (Vmax): 2.41 cm AV Vmax:        158.00 cm/s AV Peak Grad:   10.0 mmHg LVOT Vmax:      110.00 cm/s LVOT Vmean:     71.800 cm/s LVOT VTI:       0.239 m AI PHT:         486 msec  AORTA Ao Root diam: 3.50 cm MITRAL VALVE               TRICUSPID VALVE MV Area (PHT): 3.21 cm    TR Peak grad:   25.8 mmHg MV Decel Time: 236 msec    TR Vmax:        254.00 cm/s MR Peak grad: 137.4 mmHg MR Vmax:      586.00 cm/s  SHUNTS MV E velocity: 81.80 cm/s  Systemic VTI:  0.24 m MV A velocity: 80.70 cm/s  Systemic Diam: 2.10 cm MV E/A ratio:  1.01 Jayson Sierras MD Electronically signed by Jayson Sierras MD Signature Date/Time: 10/25/2023/4:13:22 PM    Final    MR BRAIN WO CONTRAST Result Date: 10/25/2023 CLINICAL DATA:  Neuro deficit, acute, stroke suspected. EXAM: MRI HEAD WITHOUT CONTRAST TECHNIQUE: Multiplanar, multiecho pulse sequences of the brain and surrounding structures were obtained without intravenous contrast. COMPARISON:  Head CT and CTA  10/24/2023 FINDINGS: Brain: There is a 1.5 cm acute infarct involving the lateral aspect of the left thalamus and adjacent posterior limb of the internal capsule. A single chronic microhemorrhage is noted in the right frontal white matter. T2 hyperintensities in the cerebral white matter bilaterally are nonspecific but compatible with mild-to-moderate chronic small vessel ischemic disease. There is mild generalized cerebral atrophy. Vascular: Major intracranial vascular flow voids are preserved. Skull and upper cervical spine: Unremarkable bone marrow signal. Sinuses/Orbits: Bilateral cataract extraction. Paranasal sinuses and mastoid air cells are clear. Other: None. IMPRESSION: 1. Acute left thalamocapsular infarct. 2. 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 disease. Electronically Signed   By: Dasie Hamburg M.D.   On: 10/25/2023 14:45   US  THYROID  Result Date: 10/25/2023 CLINICAL DATA:  Thyroid  nodule seen on CT EXAM: THYROID  ULTRASOUND TECHNIQUE: Ultrasound examination of the thyroid  gland and adjacent soft tissues was performed. COMPARISON:  CTA head and neck 10/24/2023 FINDINGS: Parenchymal Echotexture: Mildly heterogenous Isthmus: 0.8 cm Right lobe: 3.8 x 2.2 x 2.1 cm Left lobe: 5.3 x 2.3 x 2.3 cm _________________________________________________________ Estimated total number of nodules >/= 1 cm: 3 Number of spongiform nodules >/=  2 cm not described below (TR1): 0 Number of mixed cystic and solid nodules >/= 1.5 cm not described below (TR2): 0 _________________________________________________________ Nodule # 1: Location: Right; inferior Maximum size: 1.3 cm; Other 2 dimensions: 1.2 x 1.0 cm Composition: solid/almost completely solid (2) Echogenicity: hypoechoic (2) Shape: not taller-than-wide (0) Margins: smooth (0) Echogenic foci: none (0) ACR TI-RADS total points: 4. ACR TI-RADS risk category: TR4 (4-6 points). ACR TI-RADS recommendations: *Given size (>/= 1 - 1.4 cm) and appearance, a follow-up  ultrasound in 1 year should be considered based on TI-RADS criteria. _________________________________________________________ Nodule # 2:  Location: Left; mid Maximum size: 0.7 cm; Other 2 dimensions: 0.5 x 0.7 cm Composition: solid/almost completely solid (2) Echogenicity: hypoechoic (2) Shape: not taller-than-wide (0) Margins: smooth (0) Echogenic foci: large comet-tail artifacts (0) ACR TI-RADS total points: 4. ACR TI-RADS risk category: TR4 (4-6 points). ACR TI-RADS recommendations: Given size (<0.9 cm) and appearance, this nodule does NOT meet TI-RADS criteria for biopsy or dedicated follow-up. _________________________________________________________ Nodule # 3: Location: Left; inferior Maximum size: 3.0 cm; Other 2 dimensions: 2.8 x 2.4 cm Composition: solid/almost completely solid (2) Echogenicity: isoechoic (1) Shape: not taller-than-wide (0) Margins: smooth (0) Echogenic foci: none (0) ACR TI-RADS total points: 3. ACR TI-RADS risk category: TR3 (3 points). ACR TI-RADS recommendations: **Given size (>/= 2.5 cm) and appearance, fine needle aspiration of this mildly suspicious nodule should be considered based on TI-RADS criteria. This nodule corresponds to the abnormality seen on recent CT angiography. _________________________________________________________ IMPRESSION: 1. Nodule 3 (TI-RADS 3), measuring 3.0 cm, located in the inferior left thyroid  lobe, meets criteria for FNA. 2. Nodules 1 meets criteria for imaging follow-up. Annual ultrasound surveillance is recommended until 5 years of stability is documented. The above is in keeping with the ACR TI-RADS recommendations - J Am Coll Radiol 2017;14:587-595. Electronically Signed   By: Aliene Lloyd M.D.   On: 10/25/2023 08:58   CT ANGIO HEAD NECK W WO CM (CODE STROKE) Result Date: 10/24/2023 CLINICAL DATA:  Neuro deficit, acute, stroke suspected T1 EXAM: CT ANGIOGRAPHY HEAD AND NECK WITH AND WITHOUT CONTRAST TECHNIQUE: Multidetector CT imaging of the  head and neck was performed using the standard protocol during bolus administration of intravenous contrast. Multiplanar CT image reconstructions and MIPs were obtained to evaluate the vascular anatomy. Carotid stenosis measurements (when applicable) are obtained utilizing NASCET criteria, using the distal internal carotid diameter as the denominator. RADIATION DOSE REDUCTION: This exam was performed according to the departmental dose-optimization program which includes automated exposure control, adjustment of the mA and/or kV according to patient size and/or use of iterative reconstruction technique. CONTRAST:  75mL OMNIPAQUE  IOHEXOL  350 MG/ML SOLN COMPARISON:  None Available. FINDINGS: CT HEAD FINDINGS See same day CT head for intracranial findings. No contrast-enhancing lesions visualized. CTA NECK FINDINGS Aortic arch: Standard branching. Imaged portion shows no evidence of aneurysm or dissection. No significant stenosis of the major arch vessel origins. Right carotid system: No evidence of dissection, stenosis (50% or greater), or occlusion. Left carotid system: No evidence of dissection, stenosis (50% or greater), or occlusion. Vertebral arteries: Codominant. No evidence of dissection, stenosis (50% or greater), or occlusion. Skeleton: Likely chronic superior endplate compression deformities at T2-T4. Other neck: There is a 2.4 x 2.0 cm left thyroid  nodule. Recommend further evaluation with a dedicated thyroid  ultrasound. Upper chest: Negative. Review of the MIP images confirms the above findings CTA HEAD FINDINGS Anterior circulation: No proximal occlusion, aneurysm, or vascular malformation. Moderate narrowing in the cavernous segment of the right ICA Posterior circulation: No significant stenosis, proximal occlusion, aneurysm, or vascular malformation. Fenestrated basilar artery Venous sinuses: As permitted by contrast timing, patent. Anatomic variants: No Review of the MIP images confirms the above  findings IMPRESSION: 1. No intracranial large vessel occlusion. 2. No hemodynamically significant stenosis in the neck. 3. There is a 2.4 x 2.0 cm left thyroid  nodule. Recommend further evaluation with a dedicated thyroid  ultrasound. Electronically Signed   By: Lyndall Gore M.D.   On: 10/24/2023 14:59   CT HEAD CODE STROKE WO CONTRAST Result Date: 10/24/2023 CLINICAL DATA:  Code stroke. Neuro  deficit, acute, stroke suspected right-sided weakness EXAM: CT HEAD WITHOUT CONTRAST TECHNIQUE: Contiguous axial images were obtained from the base of the skull through the vertex without intravenous contrast. RADIATION DOSE REDUCTION: This exam was performed according to the departmental dose-optimization program which includes automated exposure control, adjustment of the mA and/or kV according to patient size and/or use of iterative reconstruction technique. COMPARISON:  None Available. FINDINGS: Brain: No hemorrhage. No hydrocephalus. No extra-axial fluid collection. No CT evidence of an acute cortical infarct. No mass effect. No mass lesion. There is a background of moderate chronic microvascular ischemic change. Vascular: No hyperdense vessel or unexpected calcification. Skull: Normal. Negative for fracture or focal lesion. Sinuses/Orbits: No middle ear or mastoid effusion. Paranasal sinuses are clear. Bilateral lens replacement. Orbits are otherwise unremarkable. Other: None. ASPECTS [facility] Stroke Program Early CT Score): 10 IMPRESSION: No hemorrhage or CT evidence of an acute cortical infarct. Aspects is 10. Findings were paged with Dr. Vanessa on 10/24/23 at 2:15 PM. Electronically Signed   By: Lyndall Gore M.D.   On: 10/24/2023 14:16       HISTORY OF PRESENT ILLNESS Ms. Almeter Westhoff is a 78 y.o. female with history of hypercholesterolemia, herpes simplex, cataracts presenting 1/3 with acute onset of right-sided weakness. Patient called EMS after she had trouble getting out of her car due to her right  leg weakness. CODE STROKE was activated by EMS.  Denies any hitroy of strokes, headaches, does not take blood thinners. CTH negative. Patient was unable to walk on assessment, even leaning to right while trying to simply sit up. Management with thrombolytic therapy was explained to the patient, or patient's representative, as were risks, benefits and alternatives. All questions were answered. Patient, or patient's representative, expressed understanding of the treatment plan and agreed to proceed with thrombolytic treatment.  HOSPITAL COURSE Code Stroke CT head: No acute abnormality.  CTA head & neck: No LVO MRI: Acute left thalamocapsular infarct. 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 vessel disease 2D Echo: LVEF 65 to 70%  LDL 196 HgbA1c 5.8 UDS negative VTE prophylaxis - lovenox  No antithrombotic prior to admission, now on aspirin  and Plavix  for 3 weeks followed by aspirin  alone. Therapy recommendations:  CIR Disposition: CIR   Hypertension Home meds:  none Stable Long term BP goal normotensive   Hyperlipidemia Home meds:  none LDL 196, goal < 70 Conitnue Zetia  due to prior history of myalgia with statin (crestor and zocor) use in 1990s and 2011 Consider Leqvio Continue Zetia  at discharge, pending approval of Leqvio   Other stroke risk factors Advanced age   Other medical issues Herpes  Cataract  Mild leukocytosis WBC 10.8, resolved Pressure on urination -  resolved   DISCHARGE EXAM  PHYSICAL EXAM General:  Alert, well-nourished, well-developed patient in no acute distress Psych:  Mood and affect appropriate for situation CV: Regular rate and rhythm on monitor Respiratory:  Regular, unlabored respirations on room air GI: Abdomen soft and nontender NEURO:  Mental Status: AA&Ox3, patient is able to give clear and coherent history Speech/Language: minimal dysarthria present  Naming, repetition, fluency, and comprehension intact.  No evidence of aphasia or neglect    Cranial Nerves:  II: PERRL. Visual fields full.  III, IV, VI: EOMI. Eyelids elevate symmetrically.  V: sensation intact bilaterally to face VII: improved right lower facial weakness VIII: hearing intact to voice. IX, X: Palate elevates symmetrically. Phonation is normal.  KP:Dynloizm shrug 5/5. XII: tongue is midline  Motor: 4-/5 RUE with slight drift with 4-/5 grip and  reduced coordination and fine motor movements.  3/5 RLE hip flexion with 4-/5 knee flexion, with no drift.  Tone: is normal and bulk is normal Sensation- decreased to right leg Coordination: FTN decreased on right, no overt ataxia noted.  No tremor. Fine motor movements decreased in right hand.  Gait- deferred  1a Level of Conscious.: 0 1b LOC Questions: 0 1c LOC Commands: 0 2 Best Gaze: 0 3 Visual: 0 4 Facial Palsy: 1 5a Motor Arm - left: 0 5b Motor Arm - Right: 1 6a Motor Leg - Left: 0 6b Motor Leg - Right: 2 7 Limb Ataxia: 0 8 Sensory: 1 9 Best Language: 0 10 Dysarthria: 0 11 Extinct. and Inatten.: 0 TOTAL: 5   Discharge Diet       Diet   Diet Heart Room service appropriate? Yes; Fluid consistency: Thin   liquids  DISCHARGE PLAN Disposition: Rehab aspirin  81 mg daily and clopidogrel  75 mg daily for secondary stroke prevention for 3 weeks then aspirin  81 mg daily alone. Ongoing stroke risk factor control by Primary Care Physician at time of discharge Follow-up PCP at [facility] in 2 weeks. Follow-up in [facility] in 4-6 weeks, office to schedule an appointment. Able to see NP in clinic.  35 minutes were spent preparing discharge.  Patient seen and examined by NP/APP with MD. MD to update note as needed.   Jorene Last, DNP, FNP-BC [facility] Pager: [phone]  ATTENDING NOTE: I reviewed above note and agree with the assessment and plan. Pt was seen and examined.   No acute event overnight. Neuro stable. On DAPT and zetia . Medically  ready for CIR placement. Follow up at [facility] stroke clinic in 4-6 weeks.  For detailed assessment and plan, please refer to above/below as I have made changes wherever appropriate.   Ary Cummins, MD PhD Stroke Neurology 10/28/2023 3:32 PM

## 2023-10-27 NOTE — Consult Note (Signed)
 Physical Medicine and Rehabilitation Consult Reason for Consult:right sided weakness Referring Physician: Jerri   HPI: Jordan Collier is a 78 y.o. female with a history of hypercholestermia, herpes simplex, who presented on 10/24/23 with acute right-sided weakness. CT of head negative. CTA revealed no large vessel disease. 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 Neurology feels that stroke d/t small vessel disease. Recommended ASA+Plavix  for 3 weeks then ASA alone. Pt was up with therapy yesterday and was mod assist for sit-std transfers and pivoted only. Pt reports right side feels heavy. She lives at [facility] and was independent and driving prior to admit. We had cared for her son on inpatient rehab this past fall. She assists with his care.   Review of Systems  Constitutional: Negative.   HENT: Negative.    Eyes: Negative.   Respiratory: Negative.    Cardiovascular: Negative.   Gastrointestinal: Negative.   Genitourinary: Negative.   Musculoskeletal: Negative.   Skin: Negative.   Neurological:  Positive for dizziness, sensory change, speech change, focal weakness and seizures.  Psychiatric/Behavioral: Negative.     Past Medical History:  Diagnosis Date   Allergy to mold    Hypercholesterolemia    Psoriasis     e histories are not reviewed yet. Please review them in the History navigator section and refresh this SmartLink. History reviewed. No pertinent family history. Social History:  reports that she quit smoking about 24 years ago. Her smoking use included cigarettes. She has never used smokeless tobacco. She reports that she does not currently use alcohol . She reports that she does not currently use drugs. Allergies:  Allergies  Allergen Reactions   Decadron [Dexamethasone] Rash   Molds & Smuts Other (See Comments)    Mucus over production   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 Other (See Comments)    Mucus over production   Penicillins Other (See  Comments)    Unknown reaction Childhood allergy   Sulfa Antibiotics Other (See Comments)    Unknown reaction Childhood allergy   Lanolin Swelling    Lip swelling when using lip balm containing lanolin.   Statins Other (See Comments)    Myalgias Vision changes Severe constipation   Medications Prior to Admission  Medication Sig Dispense Refill   acetaminophen  (TYLENOL ) 500 MG tablet Take 500 mg by mouth daily as needed for moderate pain (pain score 4-6), fever or headache.     cetirizine (KLS ALLER-TEC) 10 MG tablet Take 10 mg by mouth daily as needed for allergies (sinus congestion).     Cholecalciferol  (VITAMIN D -3 PO) Take 1 tablet by mouth daily.     Cyanocobalamin  (VITAMIN B-12 PO) Take 0.5 tablets by mouth daily.     mineral oil-hydrophilic petrolatum  (AQUAPHOR) ointment Apply 1 Application topically as needed (lip irritation).     Multiple Vitamins-Minerals (MULTIVITAMIN WOMEN 50+) TABS Take 1 tablet by mouth daily.     sodium chloride  (MURO 128) 5 % ophthalmic solution Place 1 drop into both eyes See admin instructions. Apply 1 drop into each eye up to 5 times daily.     valACYclovir  (VALTREX ) 1000 MG tablet Take 2,000 mg by mouth See admin instructions. Take 2 tablets (2000mg ) twice daily for 1 day with onset of cold sores.      Home: Home Living Family/patient expects to be discharged to:: Assisted living Living Arrangements: Alone Available Help at Discharge:  (lives at [facility]) Type of Home: Apartment Home Access:  (incline entrance only) Home Layout: One level Bathroom  Shower/Tub: Engineer, Manufacturing Systems: Yes Home Equipment: None  Lives With: Alone  Functional History: Prior Function Prior Level of Function : Independent/Modified Independent, Working/employed, Driving Functional Status:  Mobility: Bed Mobility Overal bed mobility: Needs Assistance Bed Mobility: Sit to Sidelying Sit to sidelying: Mod  assist General bed mobility comments: cues for direction/technique, truncal assist for control and mod assist for LE's. Transfers Overall transfer level: Needs assistance Equipment used: 1 person hand held assist Transfers: Sit to/from Stand, Bed to chair/wheelchair/BSC Sit to Stand: Mod assist Bed to/from chair/wheelchair/BSC transfer type:: Step pivot Step pivot transfers: Mod assist (over 2 feet.) General transfer comment: face to face assist for stability with assist forward and boost.  w/shift assist and assist to facilitate stepping with R LE. Ambulation/Gait General Gait Details: pivotal steps only with moderate assist.    ADL:    Cognition: Cognition Overall Cognitive Status: Within Functional Limits for tasks assessed Arousal/Alertness: Awake/alert Orientation Level: Oriented X4 Year: 2025 Month: January Day of Week: Correct Attention: Sustained Sustained Attention: Appears intact Memory: Appears intact Awareness: Appears intact Problem Solving: Appears intact Safety/Judgment: Appears intact Cognition Arousal: Alert Overall Cognitive Status: Within Functional Limits for tasks assessed  Blood pressure 126/82, pulse 80, temperature 98.6 F (37 C), temperature source Oral, resp. rate 12, height 5' 8 (1.727 m), weight 74.2 kg, SpO2 94%. Physical Exam Constitutional:      General: She is not in acute distress.    Appearance: She is not ill-appearing.  HENT:     Right Ear: External ear normal.     Left Ear: External ear normal.     Nose: Nose normal.  Eyes:     Conjunctiva/sclera: Conjunctivae normal.  Cardiovascular:     Rate and Rhythm: Normal rate.  Pulmonary:     Effort: Pulmonary effort is normal.  Abdominal:     Palpations: Abdomen is soft.  Musculoskeletal:        General: No swelling or tenderness. Normal range of motion.     Cervical back: Normal range of motion.  Skin:    General: Skin is warm and dry.  Neurological:     Mental Status: She is  alert.     Comments: Alert and oriented x 3. Normal insight and awareness. Intact Memory. Normal language. Speech is sl dysarthric. Cranial nerve exam unremarkable except for right central VII and right hemifacial sensory loss. MMT: RUE 3- prox to 3+ distally. RLE 3+ prox to 4- distally. LUE and LLE 4+ to 5/5. Sensory diminished to LT in right arm and leg which leads to poor coordination and position sense. No abnl resting tone.   Psychiatric:        Mood and Affect: Mood normal.        Behavior: Behavior normal.     Results for orders placed or performed during the hospital encounter of 10/24/23 (from the past 24 hours)  Rapid urine drug screen (hospital performed)     Status: None   Collection Time: 10/26/23  4:10 PM  Result Value Ref Range   Opiates NONE DETECTED NONE DETECTED   Cocaine NONE DETECTED NONE DETECTED   Benzodiazepines NONE DETECTED NONE DETECTED   Amphetamines NONE DETECTED NONE DETECTED   Tetrahydrocannabinol NONE DETECTED NONE DETECTED   Barbiturates NONE DETECTED NONE DETECTED  Basic metabolic panel     Status: Abnormal   Collection Time: 10/27/23  4:42 AM  Result Value Ref Range   Sodium 134 (L) 135 - 145 mmol/L   Potassium  4.2 3.5 - 5.1 mmol/L   Chloride 104 98 - 111 mmol/L   CO2 23 22 - 32 mmol/L   Glucose, Bld 108 (H) 70 - 99 mg/dL   BUN 16 8 - 23 mg/dL   Creatinine, Ser 9.13 0.44 - 1.00 mg/dL   Calcium 8.8 (L) 8.9 - 10.3 mg/dL   GFR, Estimated >39 >39 mL/min   Anion gap 7 5 - 15  CBC     Status: Abnormal   Collection Time: 10/27/23  4:42 AM  Result Value Ref Range   WBC 11.1 (H) 4.0 - 10.5 K/uL   RBC 4.60 3.87 - 5.11 MIL/uL   Hemoglobin 12.7 12.0 - 15.0 g/dL   HCT 61.2 63.9 - 53.9 %   MCV 84.1 80.0 - 100.0 fL   MCH 27.6 26.0 - 34.0 pg   MCHC 32.8 30.0 - 36.0 g/dL   RDW 85.5 88.4 - 84.4 %   Platelets 324 150 - 400 K/uL   nRBC 0.0 0.0 - 0.2 %   MR BRAIN WO CONTRAST Result Date: 10/25/2023 CLINICAL DATA:  Neuro deficit, acute, stroke suspected. EXAM:  MRI HEAD WITHOUT CONTRAST TECHNIQUE: Multiplanar, multiecho pulse sequences of the brain and surrounding structures were obtained without intravenous contrast. COMPARISON:  Head CT and CTA 10/24/2023 FINDINGS: Brain: There is a 1.5 cm acute infarct involving the lateral aspect of the left thalamus and adjacent posterior limb of the internal capsule. A single chronic microhemorrhage is noted in the right frontal white matter. T2 hyperintensities in the cerebral white matter bilaterally are nonspecific but compatible with mild-to-moderate chronic small vessel ischemic disease. There is mild generalized cerebral atrophy. Vascular: Major intracranial vascular flow voids are preserved. Skull and upper cervical spine: Unremarkable bone marrow signal. Sinuses/Orbits: Bilateral cataract extraction. Paranasal sinuses and mastoid air cells are clear. Other: None. IMPRESSION: 1. Acute left thalamocapsular infarct. 2. 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 disease. Electronically Signed   By: Dasie Hamburg M.D.   On: 10/25/2023 14:45    Assessment/Plan: Diagnosis: 78 yo s/p left thalamocapsular infarct with right hemiparesis and hemisensory loss Does the need for close, 24 hr/day medical supervision in concert with the patient's rehab needs make it unreasonable for this patient to be served in a less intensive setting? Yes Co-Morbidities requiring supervision/potential complications:  -Post-stroke sequelae -hypertension -hyperlipidemia  Due to bladder management, bowel management, safety, skin/wound care, disease management, medication administration, pain management, and patient education, does the patient require 24 hr/day rehab nursing? Yes Does the patient require coordinated care of a physician, rehab nurse, therapy disciplines of PT, OT, SLP to address physical and functional deficits in the context of the above medical diagnosis(es)? Yes Addressing deficits in the following areas: balance, endurance,  locomotion, strength, transferring, bowel/bladder control, bathing, dressing, feeding, grooming, toileting, speech, and psychosocial support Can the patient actively participate in an intensive therapy program of at least 3 hrs of therapy per day at least 5 days per week? Yes The potential for patient to make measurable gains while on inpatient rehab is excellent Anticipated functional outcomes upon discharge from inpatient rehab are modified independent  with PT, modified independent with OT, modified independent with SLP. Estimated rehab length of stay to reach the above functional goals is: 12-16 days Anticipated discharge destination: Home Overall Rehab/Functional Prognosis: excellent  POST ACUTE RECOMMENDATIONS: This patient's condition is appropriate for continued rehabilitative care in the following setting: CIR Patient has agreed to participate in recommended program. Yes Note that insurance prior authorization may be required for  reimbursement for recommended care.  Comment: Pt is motivated to regain independence. She was active, driving prior this admit. Rehab Admissions Coordinator to follow up.      I have personally performed a face to face diagnostic evaluation of this patient. Additionally, I have examined the patient's medical record including any pertinent labs and radiographic images. If the physician assistant has documented in this note, I have reviewed and edited or otherwise concur with the physician assistant's documentation.  Thanks,  Arthea ONEIDA Gunther, MD 10/27/2023

## 2023-10-27 NOTE — Progress Notes (Signed)
 Inpatient Rehab Coordinator Note:  I met with patient at bedside to discuss CIR recommendations and goals/expectations of CIR stay.  We reviewed 3 hrs/day of therapy, physician follow up, and average length of stay 2 weeks (dependent upon progress) with goals of modified independence.  She is familiar with our program from her son's stay with us  in September.  She confirms plan to return to [employer] at discharge.  She is agreeable to pursue CIR.  I will start insurance auth request.    Reche Lowers, PT, DPT Admissions Coordinator [phone] 10/27/23  3:59 PM

## 2023-10-27 NOTE — TOC CM/SW Note (Signed)
 Transition of Care [facility]) - Inpatient Brief Assessment   Patient Details  Name: Jordan Collier MRN: [id number] Date of Birth: 1946/05/05  [facility] CM/SW Contact:    Tom-Johnson, Harvest Muskrat, RN Phone Number: 10/27/2023, 11:21 AM   Clinical Narrative:  CIR screening for possible admit.   TOC will continue to follow.        Transition of Care Asessment:

## 2023-10-27 NOTE — Evaluation (Signed)
 Occupational Therapy Evaluation Patient Details Name: Jordan Collier MRN: [id number] DOB: [birth year] Today's Date: 10/27/2023   History of Present Illness Pt is a 78 y/o female presenting 1/3 with sudden onset of R sided weakness.  TNK administered.  MRI (+) Acute left thalamocapsular infarct..  No hx on file.   Clinical Impression   PT admitted with CVA. Pt currently with functional limitiations due to the deficits listed below (see OT problem list). Pt to admission indep with adls. Pt at this time requires (A) for basic transfer MOD (A) stand pivot. Pt progressed with two person (A) RW with mod cues for R LE. Pt showing increased indep with transfer compared to PT evaluation on  1/5. Pt will benefit from skilled OT to increase their independence and safety with adls and balance to allow discharge Patient will benefit from intensive inpatient follow up therapy, >3 hours/day .        If plan is discharge home, recommend the following:      Functional Status Assessment  Patient has had a recent decline in their functional status and demonstrates the ability to make significant improvements in function in a reasonable and predictable amount of time.  Equipment Recommendations  BSC/3in1;Wheelchair (measurements OT);Wheelchair cushion (measurements OT);Other (comment) (RW)    Recommendations for Other Services Rehab consult     Precautions / Restrictions Precautions Precautions: Fall Restrictions Weight Bearing Restrictions Per Provider Order: No      Mobility Bed Mobility               General bed mobility comments: oob in chair on arrival    Transfers Overall transfer level: Needs assistance Equipment used: Rolling walker (2 wheels) Transfers: Sit to/from Stand, Bed to chair/wheelchair/BSC Sit to Stand: Mod assist Stand pivot transfers: Mod assist         General transfer comment: cues for hand placement      Balance Overall balance assessment: Needs  assistance Sitting-balance support: Bilateral upper extremity supported, Feet supported Sitting balance-Leahy Scale: Fair     Standing balance support: Bilateral upper extremity supported, During functional activity, Reliant on assistive device for balance Standing balance-Leahy Scale: Poor                             ADL either performed or assessed with clinical judgement   ADL Overall ADL's : Needs assistance/impaired Eating/Feeding: Set up;Sitting                       Toilet Transfer: Moderate assistance;Stand-pivot;BSC/3in1;Grab bars Toilet Transfer Details (indicate cue type and reason): using L UE on bar to stand pivot Toileting- Clothing Manipulation and Hygiene: Maximal assistance Toileting - Clothing Manipulation Details (indicate cue type and reason): static standing     Functional mobility during ADLs: +2 for physical assistance;Moderate assistance;Rolling walker (2 wheels) General ADL Comments: pt progressed from chair to [facility] over toilet using grab bar and back to chair. pt progressed with ambultion with OT /PT assistance. PT arriving for post bathroom transfer     Vision Baseline Vision/History: 1 Wears glasses Vision Assessment?: No apparent visual deficits     Perception         Praxis         Pertinent Vitals/Pain Pain Assessment Pain Assessment: No/denies pain     Extremity/Trunk Assessment Upper Extremity Assessment Upper Extremity Assessment: RUE deficits/detail RUE Deficits / Details: AROM full ROM, pt with decreased coordinated and fine motor  RUE Sensation: decreased proprioception;decreased light touch RUE Coordination: decreased gross motor;decreased fine motor   Lower Extremity Assessment Lower Extremity Assessment: Defer to PT evaluation   Cervical / Trunk Assessment Cervical / Trunk Assessment: Kyphotic   Communication Communication Communication: No apparent difficulties (reports more difficult in the evening)    Cognition Arousal: Alert Behavior During Therapy: WFL for tasks assessed/performed Overall Cognitive Status: Within Functional Limits for tasks assessed                                       General Comments  pt noted to have rash down R side. Rn made aware. pt with bowel and bladder void during session    Exercises     Shoulder Instructions      Home Living Family/patient expects to be discharged to:: Assisted living Living Arrangements: Alone   Type of Home: Apartment       Home Layout: One level     Bathroom Shower/Tub: Producer, Television/film/video: Standard Bathroom Accessibility: Yes   Home Equipment: None          Prior Functioning/Environment Prior Level of Function : Independent/Modified Independent                        OT Problem List: Decreased strength;Decreased activity tolerance;Impaired balance (sitting and/or standing);Decreased safety awareness;Decreased knowledge of use of DME or AE;Decreased knowledge of precautions;Impaired UE functional use      OT Treatment/Interventions: Self-care/ADL training;Therapeutic exercise;Neuromuscular education;Energy conservation;DME and/or AE instruction;Manual therapy;Modalities;Therapeutic activities;Patient/family education;Balance training    OT Goals(Current goals can be found in the care plan section) Acute Rehab OT Goals Patient Stated Goal: to be able to walk OT Goal Formulation: With patient Time For Goal Achievement: 11/10/23 Potential to Achieve Goals: Good  OT Frequency: Min 1X/week    Co-evaluation PT/OT/SLP Co-Evaluation/Treatment: Yes Reason for Co-Treatment: For patient/therapist safety;To address functional/ADL transfers   OT goals addressed during session: ADL's and self-care;Strengthening/ROM      AM-PAC OT 6 Clicks Daily Activity     Outcome Measure Help from another person eating meals?: A Little Help from another person taking care of personal  grooming?: A Little Help from another person toileting, which includes using toliet, bedpan, or urinal?: A Lot Help from another person bathing (including washing, rinsing, drying)?: A Lot Help from another person to put on and taking off regular upper body clothing?: A Lot Help from another person to put on and taking off regular lower body clothing?: A Lot 6 Click Score: 14   End of Session Equipment Utilized During Treatment: Gait belt;Rolling walker (2 wheels) Nurse Communication: Mobility status;Precautions  Activity Tolerance: Patient tolerated treatment well Patient left: in chair;with call bell/phone within reach;with chair alarm set  OT Visit Diagnosis: Unsteadiness on feet (R26.81);Muscle weakness (generalized) (M62.81)                Time: 8654-8582 OT Time Calculation (min): 32 min Charges:  OT General Charges $OT Visit: 1 Visit OT Evaluation $OT Eval Moderate Complexity: 1 Mod   Brynn, OTR/L  Acute Rehabilitation Services Office: [phone] .   Ely Molt 10/27/2023, 2:43 PM

## 2023-10-27 NOTE — Progress Notes (Signed)
 Physical Therapy Treatment Patient Details Name: Jordan Collier MRN: [id number] DOB: May 28, 1946 Today's Date: 10/27/2023   History of Present Illness Pt is a 78 y/o female presenting 1/3 with sudden onset of R sided weakness.  TNK administered.  MRI (+) Acute left thalamocapsular infarct..  No hx on file.    PT Comments  Pt progressing well toward goals.  Emphasis mainly on sit to stands and progression of gait with 2 trials in the room with focus on w/shift, R toe off and swing through, translation onto R foot with control of R knee and stepping with L LE, all with 2 person mod to light max as progressed toward fatigue.    If plan is discharge home, recommend the following: A lot of help with walking and/or transfers;A little help with bathing/dressing/bathroom;Assistance with cooking/housework;Assist for transportation;Help with stairs or ramp for entrance   Can travel by private vehicle        Equipment Recommendations  Other (comment) (TBD)    Recommendations for Other Services Rehab consult     Precautions / Restrictions Precautions Precautions: Fall Restrictions Weight Bearing Restrictions Per Provider Order: No     Mobility  Bed Mobility               General bed mobility comments: oob in chair on arrival    Transfers Overall transfer level: Needs assistance Equipment used: Rolling walker (2 wheels) Transfers: Sit to/from Stand, Bed to chair/wheelchair/BSC Sit to Stand: Mod assist Stand pivot transfers: Mod assist         General transfer comment: cues for hand placement and correct sequencing for normalized movement    Ambulation/Gait Ambulation/Gait assistance: Mod assist, +2 physical assistance, +2 safety/equipment Gait Distance (Feet): 15 Feet (x1 and 10 feet x1) Assistive device: Rolling walker (2 wheels) Gait Pattern/deviations: Step-to pattern, Decreased step length - left, Decreased step length - right, Decreased stance time - right,  Decreased stride length   Gait velocity interpretation: <1.31 ft/sec, indicative of household ambulator   General Gait Details: paretic gait on the R, weak R knee extension, difficult w/shift L and generally uncoodinated movement, but that said, pt was focus on task and worked hard to fatigue on proper sequencing, improving on all the facets descibed above.   Stairs             Wheelchair Mobility     Tilt Bed    Modified Rankin (Stroke Patients Only) Modified Rankin (Stroke Patients Only) Pre-Morbid Rankin Score: No symptoms Modified Rankin: Moderately severe disability     Balance Overall balance assessment: Needs assistance Sitting-balance support: Bilateral upper extremity supported, Feet supported Sitting balance-Leahy Scale: Fair     Standing balance support: Bilateral upper extremity supported, During functional activity, Reliant on assistive device for balance Standing balance-Leahy Scale: Poor                              Cognition Arousal: Alert Behavior During Therapy: WFL for tasks assessed/performed Overall Cognitive Status: Within Functional Limits for tasks assessed                                          Exercises      General Comments General comments (skin integrity, edema, etc.): pt noted to have rash down R side. Rn made aware. pt with bowel and bladder void during session  Pertinent Vitals/Pain      Home Living Family/patient expects to be discharged to:: Assisted living Living Arrangements: Alone   Type of Home: Apartment         Home Layout: One level Home Equipment: None      Prior Function            PT Goals (current goals can now be found in the care plan section) Acute Rehab PT Goals PT Goal Formulation: With patient Time For Goal Achievement: 11/08/23 Potential to Achieve Goals: Good Progress towards PT goals: Progressing toward goals    Frequency    Min 1X/week       PT Plan      Co-evaluation PT/OT/SLP Co-Evaluation/Treatment: Yes Reason for Co-Treatment: For patient/therapist safety;To address functional/ADL transfers PT goals addressed during session: Mobility/safety with mobility OT goals addressed during session: ADL's and self-care;Strengthening/ROM      AM-PAC PT 6 Clicks Mobility   Outcome Measure  Help needed turning from your back to your side while in a flat bed without using bedrails?: A Lot Help needed moving from lying on your back to sitting on the side of a flat bed without using bedrails?: A Lot Help needed moving to and from a bed to a chair (including a wheelchair)?: A Lot Help needed standing up from a chair using your arms (e.g., wheelchair or bedside chair)?: A Lot Help needed to walk in hospital room?: Total Help needed climbing 3-5 steps with a railing? : Total 6 Click Score: 10    End of Session   Activity Tolerance: Patient tolerated treatment well Patient left: in chair;with call bell/phone within reach;with chair alarm set Nurse Communication: Mobility status PT Visit Diagnosis: Unsteadiness on feet (R26.81);Other symptoms and signs involving the nervous system (R29.898);Hemiplegia and hemiparesis Hemiplegia - Right/Left: Right Hemiplegia - dominant/non-dominant: Dominant Hemiplegia - caused by: Cerebral infarction     Time: 1350-1417 PT Time Calculation (min) (ACUTE ONLY): 27 min  Charges:    $Neuromuscular Re-education: 8-22 mins PT General Charges $$ ACUTE PT VISIT: 1 Visit                     10/27/2023  India HERO., PT Acute Rehabilitation Services [phone]  (office)   Vinie GAILS Aislinn Feliz 10/27/2023, 4:17 PM

## 2023-10-27 NOTE — Progress Notes (Addendum)
 STROKE TEAM PROGRESS NOTE   BRIEF HPI [name] is a 78 y.o. female with history of hypercholesterolemia, herpes simplex, cataracts presenting 1/3 with acute onset of right-sided weakness. Patient called EMS after she had trouble getting out of her car due to her right leg weakness. CODE STROKE was activated by EMS.  Denies any hitroy of strokes, headaches, does not take blood thinners. CTH negative. Patient was unable to walk on assessment, even leaning to right while trying to simply sit up. Management with thrombolytic therapy was explained to the patient, or patient's representative, as were risks, benefits and alternatives. All questions were answered. Patient, or patient's representative, expressed understanding of the treatment plan and agreed to proceed with thrombolytic treatment.  LKW: 1000 1/3 mRs: 0 IVT: Yes, 1/3 1414 EVT: No, no LVO  NIH on Admission: 5  INTERIM HISTORY/SUBJECTIVE Primary doctor is through [facility]  Patient sitting up in chair. RN at bedside.  Overall neurological exam improved. Right leg weakness greater than right arm, but improved.  Leqvio forms provided and signed  Discharge planning for CIR  OBJECTIVE  CBC    Component Value Date/Time   WBC 11.1 (H) 10/27/2023 0442   RBC 4.60 10/27/2023 0442   HGB 12.7 10/27/2023 0442   HCT 38.7 10/27/2023 0442   PLT 324 10/27/2023 0442   MCV 84.1 10/27/2023 0442   MCH 27.6 10/27/2023 0442   MCHC 32.8 10/27/2023 0442   RDW 14.4 10/27/2023 0442   LYMPHSABS 2.9 10/24/2023 1358   MONOABS 1.0 10/24/2023 1358   EOSABS 0.3 10/24/2023 1358   BASOSABS 0.1 10/24/2023 1358    BMET    Component Value Date/Time   NA 134 (L) 10/27/2023 0442   K 4.2 10/27/2023 0442   CL 104 10/27/2023 0442   CO2 23 10/27/2023 0442   GLUCOSE 108 (H) 10/27/2023 0442   BUN 16 10/27/2023 0442   CREATININE 0.86 10/27/2023 0442   CALCIUM 8.8 (L) 10/27/2023 0442   GFRNONAA >60 10/27/2023 0442    IMAGING past 24  hours No results found.   Vitals:   10/27/23 0700 10/27/23 0756 10/27/23 0800 10/27/23 0900  BP: (!) 156/67  (!) 113/55 139/66  Pulse: 64  61 75  Resp: 14  12 20   Temp:  98.3 F (36.8 C)    TempSrc:  Axillary    SpO2: 99%  96% 96%  Weight:      Height:         PHYSICAL EXAM General:  Alert, well-nourished, well-developed patient in no acute distress Psych:  Mood and affect appropriate for situation CV: Regular rate and rhythm on monitor Respiratory:  Regular, unlabored respirations on room air GI: Abdomen soft and nontender   NEURO:  Mental Status: AA&Ox3, patient is able to give clear and coherent history Speech/Language: minimal dysarthria present  Naming, repetition, fluency, and comprehension intact.  No evidence of aphasia or neglect  Cranial Nerves:  II: PERRL. Visual fields full.  III, IV, VI: EOMI. Eyelids elevate symmetrically.  V: sensation intact bilaterally to face VII: improved right lower facial weakness VIII: hearing intact to voice. IX, X: Palate elevates symmetrically. Phonation is normal.  KP:Dynloizm shrug 5/5. XII: tongue is midline  Motor: 4-/5 RUE with slight drift with 4-/5 grip and reduced coordination and fine motor movements.  3/5 RLE hip flexion with 4-/5 knee flexion, with no drift.  Tone: is normal and bulk is normal Sensation- decreased to right leg Coordination: FTN decreased on right, no overt ataxia noted.  No tremor. Fine motor movements decreased in right hand.  Gait- deferred  Most Recent NIH: 5    ASSESSMENT/PLAN  Stroke:  Acute left PLIC infarct s/p TNK, etiology: small vessel disease Code Stroke CT head: No acute abnormality.  CTA head & neck: No LVO MRI: Acute left thalamocapsular infarct. 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 Therapy recommendations:  CIR Disposition: pending  Hypertension Home meds:  none Stable Long term BP goal normotensive  Hyperlipidemia Home meds:  none LDL 196, goal < 70 Conitnue Zetia  due to prior history of myalgia with statin (crestor and zocor) use in 1990s and 2011 Leqvio- paperwork completed Continue Zetia  at discharge, pending approval of Leqvio  Other stroke risk factors Advanced age  Other medical issues Herpes  Cataract  Mild leukocytosis WBC 10.8, resolved Pressure on urination -  resolved  Hospital day # 3  Patient seen and examined by NP/APP with MD. MD to update note as needed.   Jorene Last, DNP, FNP-BC [facility] Pager: [phone]  ATTENDING NOTE: I reviewed above note and agree with the assessment and plan. Pt was seen and examined.   RN at the bedside. Pt sitting in chair, neuro stable, still has right hemiparesis but stable with some improvement. On DAPT and Zetia . Pending leqvio approval. Pending CIR placement.   For detailed assessment and plan, please refer to above/below as I have made changes wherever appropriate.   Ary Cummins, MD PhD Stroke Neurology 10/27/2023 3:02 PM

## 2023-10-28 ENCOUNTER — Inpatient Hospital Stay (HOSPITAL_COMMUNITY)
Admission: AD | Admit: 2023-10-28 | Discharge: 2023-11-12 | DRG: 057 | Disposition: A | Discharge status: Skilled Nursing Facility | Payer: Medicare Other | Source: Intra-hospital | Attending: Physical Medicine and Rehabilitation | Admitting: Physical Medicine and Rehabilitation

## 2023-10-28 ENCOUNTER — Encounter (HOSPITAL_COMMUNITY): Payer: Self-pay | Admitting: Neurology

## 2023-10-28 ENCOUNTER — Other Ambulatory Visit: Payer: Self-pay | Admitting: Neurology

## 2023-10-28 ENCOUNTER — Other Ambulatory Visit: Payer: Self-pay

## 2023-10-28 ENCOUNTER — Encounter (HOSPITAL_COMMUNITY): Payer: Self-pay | Admitting: Physical Medicine and Rehabilitation

## 2023-10-28 DIAGNOSIS — R6889 Other general symptoms and signs: Secondary | ICD-10-CM | POA: Diagnosis present

## 2023-10-28 DIAGNOSIS — I10 Essential (primary) hypertension: Secondary | ICD-10-CM | POA: Diagnosis not present

## 2023-10-28 DIAGNOSIS — Z833 Family history of diabetes mellitus: Secondary | ICD-10-CM | POA: Diagnosis not present

## 2023-10-28 DIAGNOSIS — E871 Hypo-osmolality and hyponatremia: Secondary | ICD-10-CM | POA: Diagnosis present

## 2023-10-28 DIAGNOSIS — Z888 Allergy status to other drugs, medicaments and biological substances status: Secondary | ICD-10-CM | POA: Diagnosis not present

## 2023-10-28 DIAGNOSIS — Z8619 Personal history of other infectious and parasitic diseases: Secondary | ICD-10-CM | POA: Diagnosis not present

## 2023-10-28 DIAGNOSIS — B029 Zoster without complications: Secondary | ICD-10-CM | POA: Diagnosis not present

## 2023-10-28 DIAGNOSIS — E559 Vitamin D deficiency, unspecified: Secondary | ICD-10-CM | POA: Diagnosis present

## 2023-10-28 DIAGNOSIS — Z880 Allergy status to penicillin: Secondary | ICD-10-CM

## 2023-10-28 DIAGNOSIS — I69354 Hemiplegia and hemiparesis following cerebral infarction affecting left non-dominant side: Secondary | ICD-10-CM | POA: Diagnosis present

## 2023-10-28 DIAGNOSIS — Z87891 Personal history of nicotine dependence: Secondary | ICD-10-CM | POA: Diagnosis not present

## 2023-10-28 DIAGNOSIS — Z79899 Other long term (current) drug therapy: Secondary | ICD-10-CM

## 2023-10-28 DIAGNOSIS — I6381 Other cerebral infarction due to occlusion or stenosis of small artery: Secondary | ICD-10-CM | POA: Diagnosis not present

## 2023-10-28 DIAGNOSIS — R200 Anesthesia of skin: Secondary | ICD-10-CM | POA: Diagnosis not present

## 2023-10-28 DIAGNOSIS — E7800 Pure hypercholesterolemia, unspecified: Secondary | ICD-10-CM | POA: Diagnosis present

## 2023-10-28 DIAGNOSIS — L409 Psoriasis, unspecified: Secondary | ICD-10-CM | POA: Diagnosis present

## 2023-10-28 DIAGNOSIS — F32A Depression, unspecified: Secondary | ICD-10-CM | POA: Diagnosis present

## 2023-10-28 DIAGNOSIS — I69322 Dysarthria following cerebral infarction: Secondary | ICD-10-CM

## 2023-10-28 DIAGNOSIS — R739 Hyperglycemia, unspecified: Secondary | ICD-10-CM | POA: Diagnosis present

## 2023-10-28 DIAGNOSIS — K5900 Constipation, unspecified: Secondary | ICD-10-CM | POA: Diagnosis present

## 2023-10-28 DIAGNOSIS — Z882 Allergy status to sulfonamides status: Secondary | ICD-10-CM

## 2023-10-28 DIAGNOSIS — E785 Hyperlipidemia, unspecified: Secondary | ICD-10-CM | POA: Diagnosis present

## 2023-10-28 DIAGNOSIS — G4700 Insomnia, unspecified: Secondary | ICD-10-CM | POA: Diagnosis present

## 2023-10-28 DIAGNOSIS — K5901 Slow transit constipation: Secondary | ICD-10-CM | POA: Diagnosis not present

## 2023-10-28 DIAGNOSIS — Z83438 Family history of other disorder of lipoprotein metabolism and other lipidemia: Secondary | ICD-10-CM | POA: Diagnosis not present

## 2023-10-28 LAB — CBC
HCT: 39.6 % (ref 36.0–46.0)
Hemoglobin: 13.1 g/dL (ref 12.0–15.0)
MCH: 27.5 pg (ref 26.0–34.0)
MCHC: 33.1 g/dL (ref 30.0–36.0)
MCV: 83 fL (ref 80.0–100.0)
Platelets: 335 10*3/uL (ref 150–400)
RBC: 4.77 MIL/uL (ref 3.87–5.11)
RDW: 14.4 % (ref 11.5–15.5)
WBC: 10.2 10*3/uL (ref 4.0–10.5)
nRBC: 0 % (ref 0.0–0.2)

## 2023-10-28 LAB — BASIC METABOLIC PANEL
Anion gap: 8 (ref 5–15)
BUN: 10 mg/dL (ref 8–23)
CO2: 23 mmol/L (ref 22–32)
Calcium: 9 mg/dL (ref 8.9–10.3)
Chloride: 104 mmol/L (ref 98–111)
Creatinine, Ser: 0.73 mg/dL (ref 0.44–1.00)
GFR, Estimated: >60 mL/min (ref >60)
Glucose, Bld: 101 mg/dL — ABNORMAL HIGH (ref 70–99)
Potassium: 4.2 mmol/L (ref 3.5–5.1)
Sodium: 135 mmol/L (ref 135–145)

## 2023-10-28 MED ORDER — VITAMIN B-12 100 MCG PO TABS
100.0000 ug | ORAL_TABLET | Freq: Every day | ORAL | Status: DC
  Administered 2023-10-28 – 2023-11-12 (×16): 100 ug via ORAL
  Filled 2023-10-28 (×16): qty 1

## 2023-10-28 MED ORDER — SENNOSIDES-DOCUSATE SODIUM 8.6-50 MG PO TABS: 1.0000 | ORAL_TABLET | Freq: Every evening | ORAL | Status: DC | PRN

## 2023-10-28 MED ORDER — CLOPIDOGREL BISULFATE 75 MG PO TABS
75.0000 mg | ORAL_TABLET | Freq: Every day | ORAL | Status: DC
  Administered 2023-10-29 – 2023-11-12 (×15): 75 mg via ORAL
  Filled 2023-10-28 (×15): qty 1

## 2023-10-28 MED ORDER — MELATONIN 5 MG PO TABS: 5.0000 mg | ORAL_TABLET | Freq: Every evening | ORAL | Status: DC | PRN

## 2023-10-28 MED ORDER — ALUM & MAG HYDROXIDE-SIMETH 200-200-20 MG/5ML PO SUSP
30.0000 mL | ORAL | Status: DC | PRN
Start: 2023-10-28 — End: 2023-11-12

## 2023-10-28 MED ORDER — SENNOSIDES-DOCUSATE SODIUM 8.6-50 MG PO TABS: 1.0000 | ORAL_TABLET | Freq: Every evening | ORAL | Status: AC | PRN

## 2023-10-28 MED ORDER — BISACODYL 10 MG RE SUPP: 10.0000 mg | Freq: Every day | RECTAL | Status: DC | PRN

## 2023-10-28 MED ORDER — WHITE PETROLATUM EX OINT
TOPICAL_OINTMENT | CUTANEOUS | Status: DC | PRN
  Filled 2023-10-28: qty 28.35

## 2023-10-28 MED ORDER — CLOPIDOGREL BISULFATE 75 MG PO TABS: 75.0000 mg | ORAL_TABLET | Freq: Every day | ORAL | Status: DC

## 2023-10-28 MED ORDER — ACETAMINOPHEN 325 MG PO TABS: 325.0000 mg | ORAL_TABLET | ORAL | Status: DC | PRN

## 2023-10-28 MED ORDER — GUAIFENESIN-DM 100-10 MG/5ML PO SYRP: 5.0000 mL | ORAL_SOLUTION | Freq: Four times a day (QID) | ORAL | Status: DC | PRN

## 2023-10-28 MED ORDER — FLEET ENEMA RE ENEM: 1.0000 | ENEMA | Freq: Once | RECTAL | Status: DC | PRN

## 2023-10-28 MED ORDER — ENOXAPARIN SODIUM 40 MG/0.4ML IJ SOSY
40.0000 mg | PREFILLED_SYRINGE | INTRAMUSCULAR | Status: DC
  Administered 2023-10-29 – 2023-11-03 (×6): 40 mg via SUBCUTANEOUS
  Filled 2023-10-28 (×6): qty 0.4

## 2023-10-28 MED ORDER — ASPIRIN 81 MG PO TBEC: 81.0000 mg | DELAYED_RELEASE_TABLET | Freq: Every day | ORAL | Status: AC

## 2023-10-28 MED ORDER — PROCHLORPERAZINE MALEATE 5 MG PO TABS: 5.0000 mg | ORAL_TABLET | Freq: Four times a day (QID) | ORAL | Status: DC | PRN

## 2023-10-28 MED ORDER — EZETIMIBE 10 MG PO TABS
10.0000 mg | ORAL_TABLET | Freq: Every day | ORAL | Status: DC
  Administered 2023-10-29 – 2023-11-12 (×15): 10 mg via ORAL
  Filled 2023-10-28 (×15): qty 1

## 2023-10-28 MED ORDER — DIPHENHYDRAMINE HCL 25 MG PO CAPS: 25.0000 mg | ORAL_CAPSULE | Freq: Four times a day (QID) | ORAL | Status: DC | PRN

## 2023-10-28 MED ORDER — POLYETHYLENE GLYCOL 3350 17 G PO PACK
34.0000 g | PACK | Freq: Once | ORAL | Status: AC
  Administered 2023-10-28: 34 g via ORAL
  Filled 2023-10-28: qty 2

## 2023-10-28 MED ORDER — PROCHLORPERAZINE 25 MG RE SUPP: 12.5000 mg | Freq: Four times a day (QID) | RECTAL | Status: DC | PRN

## 2023-10-28 MED ORDER — EZETIMIBE 10 MG PO TABS: 10.0000 mg | ORAL_TABLET | Freq: Every day | ORAL | Status: DC

## 2023-10-28 MED ORDER — ORAL CARE MOUTH RINSE: 15.0000 mL | OROMUCOSAL | Status: DC | PRN

## 2023-10-28 MED ORDER — PROCHLORPERAZINE EDISYLATE 10 MG/2ML IJ SOLN
5.0000 mg | Freq: Four times a day (QID) | INTRAMUSCULAR | Status: DC | PRN
Start: 2023-10-28 — End: 2023-11-12

## 2023-10-28 MED ORDER — ASPIRIN 81 MG PO TBEC
81.0000 mg | DELAYED_RELEASE_TABLET | Freq: Every day | ORAL | Status: DC
  Administered 2023-10-29 – 2023-11-12 (×15): 81 mg via ORAL
  Filled 2023-10-28 (×15): qty 1

## 2023-10-28 MED ORDER — ONDANSETRON HCL 4 MG/2ML IJ SOLN: 4.0000 mg | Freq: Three times a day (TID) | INTRAMUSCULAR | Status: DC | PRN

## 2023-10-28 NOTE — H&P (Signed)
 Physical Medicine and Rehabilitation Admission H&P    Chief Complaint  Patient presents with   Functional deficits due to stroke    HPI:  Jordan Collier is a 78 year old LH- female with history of hypercholesterolemia, HSV, psoriasis who was admitted to [facility] on 10/24/23 with acute onset of RLE weakness when she tried to get out of the car and activated EMS. CTA head/neck was negative for LVO and showed incident left thyroid  nodule 2.4 X 2.0 cm. 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 Thyroid  ultrasound done for follow up and revealed 3 cm left nodule meeting criteria for FNA and one meeting criteria for yearly surveillance. 2D  echo showed EF 65-70% with no wall abnormality, grade II diastolic dysfunction, mild to moderate MVR and mild to moderate aortic valve sclerosis. Dr. Jerri felt that stroke was due to small vessel disease and recommended DAPT X 3 weeks followed by ASA alone. Patient with right sided weakness affecting mobility and ADLs.  She requires mod assist currently and was independent PTA. CIR recommended due to functional decline.    Review of Systems  Constitutional:  Negative for chills and fever.  Eyes:  Negative for blurred vision and double vision.  Respiratory:  Negative for cough and shortness of breath.   Cardiovascular:  Negative for chest pain and palpitations.  Gastrointestinal:  Positive for constipation. Negative for heartburn and nausea.  Musculoskeletal:  Negative for back pain, myalgias and neck pain.  Neurological:  Positive for dizziness (this am), sensory change and focal weakness (RLE, right hand and right face).     Past Medical History:  Diagnosis Date   Allergy to mold    Hypercholesterolemia    Psoriasis    Trigger finger, right middle finger     Past Surgical History:  Procedure Laterality Date   TRIGGER FINGER RELEASE Right 01/2023    TUBAL LIGATION N/A 1980    Family History  Problem Relation Age of Onset   Hyperlipidemia Mother    Diabetes Mother    Cancer Father    Seizures Son      Social History:  Lives alone  at 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  She smoked about 20 years and reports that she quit smoking about 24 years ago. Her smoking use included cigarettes. She has never used smokeless tobacco. She reports that she does not currently use alcohol . She reports that she does not currently use drugs.   Allergies  Allergen Reactions   Decadron [Dexamethasone] Rash   Molds & Smuts Other (See Comments)    Mucus over production   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 Other (See Comments)    Mucus over production   Penicillins Other (See Comments)    Unknown reaction Childhood allergy   Sulfa Antibiotics Other (See Comments)    Unknown reaction Childhood allergy   Lanolin Swelling    Lip swelling when using lip balm containing lanolin.   Statins Other (See Comments)    Myalgias Vision changes Severe constipation    Medications Prior to Admission  Medication Sig Dispense Refill   acetaminophen  (TYLENOL ) 500 MG tablet Take 500 mg by mouth daily as needed for moderate pain (pain score 4-6), fever or headache.     cetirizine (KLS ALLER-TEC) 10 MG tablet Take 10 mg by mouth daily as needed for allergies (sinus congestion).  Cholecalciferol  (VITAMIN D -3 PO) Take 1 tablet by mouth daily.     Cyanocobalamin  (VITAMIN B-12 PO) Take 0.5 tablets by mouth daily.     mineral oil-hydrophilic petrolatum  (AQUAPHOR) ointment Apply 1 Application topically as needed (lip irritation).     Multiple Vitamins-Minerals (MULTIVITAMIN WOMEN 50+) TABS Take 1 tablet by mouth daily.     sodium chloride  (MURO 128) 5 % ophthalmic solution Place 1 drop into both eyes See admin instructions. Apply 1 drop into each eye up to 5 times daily.     valACYclovir  (VALTREX ) 1000 MG tablet Take 2,000 mg  by mouth See admin instructions. Take 2 tablets (2000mg ) twice daily for 1 day with onset of cold sores.        Home: Home Living Family/patient expects to be discharged to:: Assisted living Living Arrangements: Alone Available Help at Discharge:  (lives at [facility]) Type of Home: Apartment Home Access:  (incline entrance only) Home Layout: One level Bathroom Shower/Tub: Health Visitor: Pharmacist, Community: Yes Home Equipment: None  Lives With: Alone   Functional History: Prior Function Prior Level of Function : Independent/Modified Independent  Functional Status:  Mobility: Bed Mobility Overal bed mobility: Needs Assistance Bed Mobility: Sit to Sidelying Sit to sidelying: Mod assist General bed mobility comments: oob in chair on arrival Transfers Overall transfer level: Needs assistance Equipment used: Rolling walker (2 wheels) Transfers: Sit to/from Stand, Bed to chair/wheelchair/BSC Sit to Stand: Mod assist Bed to/from chair/wheelchair/BSC transfer type:: Stand pivot Stand pivot transfers: Mod assist Step pivot transfers: Mod assist (over 2 feet.) General transfer comment: cues for hand placement and correct sequencing for normalized movement Ambulation/Gait Ambulation/Gait assistance: Mod assist, +2 physical assistance, +2 safety/equipment Gait Distance (Feet): 15 Feet (x1 and 10 feet x1) Assistive device: Rolling walker (2 wheels) Gait Pattern/deviations: Step-to pattern, Decreased step length - left, Decreased step length - right, Decreased stance time - right, Decreased stride length General Gait Details: paretic gait on the R, weak R knee extension, difficult w/shift L and generally uncoodinated movement, but that said, pt was focus on task and worked hard to fatigue on proper sequencing, improving on all the facets descibed above. Gait velocity interpretation: <1.31 ft/sec, indicative of household ambulator     ADL: ADL Overall ADL's : Needs assistance/impaired Eating/Feeding: Set up, Sitting Toilet Transfer: Moderate assistance, Stand-pivot, BSC/3in1, Grab bars Toilet Transfer Details (indicate cue type and reason): using L UE on bar to stand pivot Toileting- Clothing Manipulation and Hygiene: Maximal assistance Toileting - Clothing Manipulation Details (indicate cue type and reason): static standing Functional mobility during ADLs: +2 for physical assistance, Moderate assistance, Rolling walker (2 wheels) General ADL Comments: pt progressed from chair to [facility] over toilet using grab bar and back to chair. pt progressed with ambultion with OT /PT assistance. PT arriving for post bathroom transfer  Cognition: Cognition Overall Cognitive Status: Within Functional Limits for tasks assessed Arousal/Alertness: Awake/alert Orientation Level: Oriented X4 Year: 2025 Month: January Day of Week: Correct Attention: Sustained Sustained Attention: Appears intact Memory: Appears intact Awareness: Appears intact Problem Solving: Appears intact Safety/Judgment: Appears intact Cognition Arousal: Alert Behavior During Therapy: WFL for tasks assessed/performed Overall Cognitive Status: Within Functional Limits for tasks assessed   Blood pressure 114/71, pulse 88, temperature 98 F (36.7 C), temperature source Oral, resp. rate 17, height 5' 8 (1.727 m), weight 74.2 kg, SpO2 97%. Physical Exam Vitals and nursing note reviewed.  Constitutional:      General: She is not in acute distress.  Appearance: Normal appearance.  HENT:     Head: Normocephalic.     Right Ear: External ear normal.     Left Ear: External ear normal.     Nose: Nose normal.  Eyes:     Extraocular Movements: Extraocular movements intact.     Conjunctiva/sclera: Conjunctivae normal.     Pupils: Pupils are equal, round, and reactive to light.  Cardiovascular:     Rate and Rhythm: Normal rate and regular rhythm.     Heart  sounds: No murmur heard.    No gallop.  Pulmonary:     Effort: Pulmonary effort is normal. No respiratory distress.     Breath sounds: Normal breath sounds. No wheezing.  Abdominal:     General: Bowel sounds are normal. There is no distension.     Tenderness: There is no abdominal tenderness.  Musculoskeletal:        General: No swelling or tenderness. Normal range of motion.     Cervical back: Normal range of motion.  Skin:    General: Skin is warm and dry.  Neurological:     Mental Status: She is alert.     Comments: Alert and oriented x 3. Normal insight and awareness. Intact Memory. Normal language with mild dysarthria. Cranial nerve exam unremarkable except for right central VII and right hemifacial sensory loss. MMT: RUE is 3 to 3+/5 prox to distal. RLE 3+/5 prox to 4/5 distal. LUE and LLE 4+ to 5/5. Sensory exam notable for 1/2 to LT and pain in right arm and leg. Decreased FMC of right side with PD. No frank ataxia.  No abnl resting tone. DTR's 1+.    Psychiatric:        Mood and Affect: Mood normal.        Behavior: Behavior normal.     Results for orders placed or performed during the hospital encounter of 10/24/23 (from the past 48 hours)  Rapid urine drug screen (hospital performed)     Status: None   Collection Time: 10/26/23  4:10 PM  Result Value Ref Range   Opiates NONE DETECTED NONE DETECTED   Cocaine NONE DETECTED NONE DETECTED   Benzodiazepines NONE DETECTED NONE DETECTED   Amphetamines NONE DETECTED NONE DETECTED   Tetrahydrocannabinol NONE DETECTED NONE DETECTED   Barbiturates NONE DETECTED NONE DETECTED    Comment: (NOTE) DRUG SCREEN FOR MEDICAL PURPOSES ONLY.  IF CONFIRMATION IS NEEDED FOR ANY PURPOSE, NOTIFY LAB WITHIN 5 DAYS.  LOWEST DETECTABLE LIMITS FOR URINE DRUG SCREEN Drug Class                     Cutoff (ng/mL) Amphetamine and metabolites    1000 Barbiturate and metabolites    200 Benzodiazepine                 200 Opiates and metabolites         300 Cocaine and metabolites        300 THC                            50 Performed at [facility], [address], [place], [place] [address]   Basic metabolic panel     Status: Abnormal   Collection Time: 10/27/23  4:42 AM  Result Value Ref Range   Sodium 134 (L) 135 - 145 mmol/L   Potassium 4.2 3.5 - 5.1 mmol/L   Chloride 104 98 - 111 mmol/L  CO2 23 22 - 32 mmol/L   Glucose, Bld 108 (H) 70 - 99 mg/dL    Comment: Glucose reference range applies only to samples taken after fasting for at least 8 hours.   BUN 16 8 - 23 mg/dL   Creatinine, Ser 9.13 0.44 - 1.00 mg/dL   Calcium 8.8 (L) 8.9 - 10.3 mg/dL   GFR, Estimated >39 >39 mL/min    Comment: (NOTE) Calculated using the CKD-EPI Creatinine Equation (2021)    Anion gap 7 5 - 15    Comment: Performed at [facility], [address], [place], [place] [address]  CBC     Status: Abnormal   Collection Time: 10/27/23  4:42 AM  Result Value Ref Range   WBC 11.1 (H) 4.0 - 10.5 K/uL   RBC 4.60 3.87 - 5.11 MIL/uL   Hemoglobin 12.7 12.0 - 15.0 g/dL   HCT 61.2 63.9 - 53.9 %   MCV 84.1 80.0 - 100.0 fL   MCH 27.6 26.0 - 34.0 pg   MCHC 32.8 30.0 - 36.0 g/dL   RDW 85.5 88.4 - 84.4 %   Platelets 324 150 - 400 K/uL   nRBC 0.0 0.0 - 0.2 %    Comment: Performed at [facility], [address] [place], [place] [address]  Basic metabolic panel     Status: Abnormal   Collection Time: 10/28/23  5:33 AM  Result Value Ref Range   Sodium 135 135 - 145 mmol/L   Potassium 4.2 3.5 - 5.1 mmol/L   Chloride 104 98 - 111 mmol/L   CO2 23 22 - 32 mmol/L   Glucose, Bld 101 (H) 70 - 99 mg/dL    Comment: Glucose reference range applies only to samples taken after fasting for at least 8 hours.   BUN 10 8 - 23 mg/dL   Creatinine, Ser 9.26 0.44 - 1.00 mg/dL   Calcium 9.0 8.9 - 89.6 mg/dL   GFR, Estimated >39 >39 mL/min    Comment: (NOTE) Calculated using the CKD-EPI Creatinine Equation (2021)    Anion gap 8 5 - 15    Comment:  Performed at [facility], [address], [place], [place] [address]  CBC     Status: None   Collection Time: 10/28/23  5:33 AM  Result Value Ref Range   WBC 10.2 4.0 - 10.5 K/uL   RBC 4.77 3.87 - 5.11 MIL/uL   Hemoglobin 13.1 12.0 - 15.0 g/dL   HCT 60.3 63.9 - 53.9 %   MCV 83.0 80.0 - 100.0 fL   MCH 27.5 26.0 - 34.0 pg   MCHC 33.1 30.0 - 36.0 g/dL   RDW 85.5 88.4 - 84.4 %   Platelets 335 150 - 400 K/uL   nRBC 0.0 0.0 - 0.2 %    Comment: Performed at [facility], [address], [place], [place] [address]   No results found.    Blood pressure 114/71, pulse 88, temperature 98 F (36.7 C), temperature source Oral, resp. rate 17, height 5' 8 (1.727 m), weight 74.2 kg, SpO2 97%.  Medical Problem List and Plan: 1. 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   -patient may  shower  -ELOS/Goals: 12-16 days, mod I goals  -having some waxing and waning sensory symptoms particularly in RLE. Discussed characteristics of these, particularly after thalamic stroke 2.  Antithrombotics: -DVT/anticoagulation:  Pharmaceutical: Lovenox   -antiplatelet therapy: plavix  and 81mg  asa for 3  weeks then asa alone.  3. Pain Management: Tylenol  prn. 4. Mood/Behavior/Sleep: LCSW to follow for evaluation and support.  --melatonin prn for insomnia.   -antipsychotic agents: N/A  -pt experiencing some periodic depression. Feels that she can work through it. Encouraged her to reach out if things should change. 5. Neuropsych/cognition: This patient is capable of making decisions on her own behalf. 6. Skin/Wound Care: Routine pressure relief measures.  7. Fluids/Electrolytes/Nutrition: Monitor I/O. Check lytes in am.   -multivitamin daily 8. Blood pressure: Monitor TID--controlled at without meds 9. Hypercholesterolemia: LDL-196. On Zetia  and Leqvio pending approval.      Sharlet GORMAN Schmitz, PA-C 10/28/2023

## 2023-10-28 NOTE — Progress Notes (Signed)
 Patient arrived and oriented to unit routines. Noted some blanchable redness across buttocks, patient states she occasionally gets contact rash. Photograph placed in chart and PA notified. Alert and oriented, resting comfortably with call bell in place.

## 2023-10-28 NOTE — Plan of Care (Signed)
  Problem: Education: Goal: Knowledge of disease or condition will improve Outcome: Progressing Goal: Knowledge of secondary prevention will improve (MUST DOCUMENT ALL) Outcome: Progressing Goal: Knowledge of patient specific risk factors will improve Alonso N/A or DELETE if not current risk factor) Outcome: Progressing   Problem: Coping: Goal: Will verbalize positive feelings about self Outcome: Progressing Goal: Will identify appropriate support needs Outcome: Progressing   Problem: Health Behavior/Discharge Planning: Goal: Ability to manage health-related needs will improve Outcome: Progressing Goal: Goals will be collaboratively established with patient/family Outcome: Progressing   Problem: Self-Care: Goal: Ability to participate in self-care as condition permits will improve Outcome: Progressing Goal: Verbalization of feelings and concerns over difficulty with self-care will improve Outcome: Progressing Goal: Ability to communicate needs accurately will improve Outcome: Progressing   Problem: Nutrition: Goal: Risk of aspiration will decrease Outcome: Progressing Goal: Dietary intake will improve Outcome: Progressing

## 2023-10-28 NOTE — TOC Transition Note (Signed)
 Transition of Care [facility]) - Discharge Note   Patient Details  Name: Jordan Collier MRN: [id number] Date of Birth: 1945/12/06  Transition of [facility]) CM/SW Contact:  Andrez JULIANNA George, RN Phone Number: 10/28/2023, 10:21 AM   Clinical Narrative:     Pt is discharging to CIR today. CM signing off.  Final next level of care: IP Rehab Facility Barriers to Discharge: No Barriers Identified   Patient Goals and CMS Choice   CMS Medicare.gov Compare Post Acute Care list provided to:: Patient Choice offered to / list presented to : Patient      Discharge Placement                       Discharge Plan and Services Additional resources added to the After Visit Summary for                                       Social Drivers of Health (SDOH) Interventions SDOH Screenings   Food Insecurity: Patient Declined (10/24/2023)  Housing: Low Risk  (10/24/2023)  Transportation Needs: No Transportation Needs (10/24/2023)  Utilities: Not At Risk (10/24/2023)  Social Connections: Unknown (10/24/2023)  Tobacco Use: Medium Risk (10/26/2023)     Readmission Risk Interventions     No data to display

## 2023-10-28 NOTE — Care Management Important Message (Signed)
 Important Message  Patient Details  Name: Jordan Collier MRN: [id number] Date of Birth: 1946/04/23   Important Message Given:  Yes - Medicare IM     Dorena Bodo 10/28/2023, 2:22 PM

## 2023-10-28 NOTE — Progress Notes (Signed)
 Inpatient Rehabilitation Admission Medication Review by a Pharmacist  A complete drug regimen review was completed for this patient to identify any potential clinically significant medication issues.  High Risk Drug Classes Is patient taking? Indication by Medication  Antipsychotic Yes Compazine - N/V  Anticoagulant Yes Lovenox - vte ppx  Antibiotic No   Opioid No   Antiplatelet Yes Aspirin  / plavix - cva ppx Plavix  x21 days only (end 1/24)  Hypoglycemics/insulin No   Vasoactive Medication No   Chemotherapy No   Other Yes Zetia - HLD Melatonin- sleep     Type of Medication Issue Identified Description of Issue Recommendation(s)  Drug Interaction(s) (clinically significant)     Duplicate Therapy     Allergy     No Medication Administration End Date     Incorrect Dose     Additional Drug Therapy Needed     Significant med changes from prior encounter (inform family/care partners about these prior to discharge).    Other  PTA meds: Zyrtec Valtrex  Restart PTA meds when and if necessary during CIR admission or at time of discharge, if warranted    Clinically significant medication issues were identified that warrant physician communication and completion of prescribed/recommended actions by midnight of the next day:  No   Time spent performing this drug regimen review (minutes):  30   Erilyn Pearman BS, PharmD, BCPS Clinical Pharmacist 10/28/2023 2:44 PM  Contact: [phone] after 3 PM  Be curious, not judgmental... -Davina Sprinkles

## 2023-10-28 NOTE — Progress Notes (Signed)
 Patient ID: Jordan Collier, female   DOB: 1945/12/28, 78 y.o.   MRN: [id number] Met with the patient to review current medical situation, rehab process, team conference and plan of care.Reviewed secondary risk management including, previous CVAs; not on anticoag. And DOAC for 3 weeks then ASA solo. Also reviewed medications and diet modification for HTN,  and HLD (Zetia ) and HH diet. Reviewed [employer] application.Continue to follow along to address educational needs to facilitate preparation for discharge. Jordan Collier

## 2023-10-28 NOTE — Progress Notes (Signed)
 Inpatient Rehab Admissions Coordinator:   I have insurance approval and a bed for pt to admit today.  Dr. Jerri in agreement and [facility] aware.  I will notify pt and make arrangements.    Reche Lowers, PT, DPT Admissions Coordinator [phone] 10/28/23  10:03 AM

## 2023-10-28 NOTE — Progress Notes (Signed)
 Babs Arthea DASEN, MD  Physician Physical Medicine and Rehabilitation   Consult Note     Signed   Date of Service: 10/27/2023 12:50 PM  Related encounter: ED to Hosp-Admission (Discharged) from 10/24/2023 in [facility]   Signed     Expand All Collapse All           Physical Medicine and Rehabilitation Consult Reason for Consult:right sided weakness Referring Physician: Jerri     HPI: Jordan Collier is a 78 y.o. female with a history of hypercholestermia, herpes simplex, who presented on 10/24/23 with acute right-sided weakness. CT of head negative. CTA revealed no large vessel disease. 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 Neurology feels that stroke d/t small vessel disease. Recommended ASA+Plavix  for 3 weeks then ASA alone. Pt was up with therapy yesterday and was mod assist for sit-std transfers and pivoted only. Pt reports right side feels heavy. She lives at [facility] and was independent and driving prior to admit. We had cared for her son on inpatient rehab this past fall. She assists with his care.     Review of Systems  Constitutional: Negative.   HENT: Negative.    Eyes: Negative.   Respiratory: Negative.    Cardiovascular: Negative.   Gastrointestinal: Negative.   Genitourinary: Negative.   Musculoskeletal: Negative.   Skin: Negative.   Neurological:  Positive for dizziness, sensory change, speech change, focal weakness and seizures.  Psychiatric/Behavioral: Negative.          Past Medical History:  Diagnosis Date   Allergy to mold     Hypercholesterolemia     Psoriasis           e histories are not reviewed yet. Please review them in the History navigator section and refresh this SmartLink. History reviewed. No pertinent family history.     Social History:  reports that she quit smoking about 24 years ago. Her smoking use included cigarettes. She has never used smokeless tobacco. She reports that she does not  currently use alcohol . She reports that she does not currently use drugs. Allergies:  Allergies       Allergies  Allergen Reactions   Decadron [Dexamethasone] Rash   Molds & Smuts Other (See Comments)      Mucus over production   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 Other (See Comments)      Mucus over production   Penicillins Other (See Comments)      Unknown reaction Childhood allergy   Sulfa Antibiotics Other (See Comments)      Unknown reaction Childhood allergy   Lanolin Swelling      Lip swelling when using lip balm containing lanolin.   Statins Other (See Comments)      Myalgias Vision changes Severe constipation            Medications Prior to Admission  Medication Sig Dispense Refill   acetaminophen  (TYLENOL ) 500 MG tablet Take 500 mg by mouth daily as needed for moderate pain (pain score 4-6), fever or headache.       cetirizine (KLS ALLER-TEC) 10 MG tablet Take 10 mg by mouth daily as needed for allergies (sinus congestion).       Cholecalciferol  (VITAMIN D -3 PO) Take 1 tablet by mouth daily.       Cyanocobalamin  (VITAMIN B-12 PO) Take 0.5 tablets by mouth daily.       mineral oil-hydrophilic petrolatum  (AQUAPHOR) ointment Apply 1 Application topically as needed (lip irritation).  Multiple Vitamins-Minerals (MULTIVITAMIN WOMEN 50+) TABS Take 1 tablet by mouth daily.       sodium chloride  (MURO 128) 5 % ophthalmic solution Place 1 drop into both eyes See admin instructions. Apply 1 drop into each eye up to 5 times daily.       valACYclovir  (VALTREX ) 1000 MG tablet Take 2,000 mg by mouth See admin instructions. Take 2 tablets (2000mg ) twice daily for 1 day with onset of cold sores.              Home: Home Living Family/patient expects to be discharged to:: Assisted living Living Arrangements: Alone Available Help at Discharge:  (lives at [facility]) Type of Home: Apartment Home Access:  (incline entrance only) Home Layout: One level Bathroom Shower/Tub:  Health Visitor: Pharmacist, Community: Yes Home Equipment: None  Lives With: Alone  Functional History: Prior Function Prior Level of Function : Independent/Modified Independent, Working/employed, Driving Functional Status:  Mobility: Bed Mobility Overal bed mobility: Needs Assistance Bed Mobility: Sit to Sidelying Sit to sidelying: Mod assist General bed mobility comments: cues for direction/technique, truncal assist for control and mod assist for LE's. Transfers Overall transfer level: Needs assistance Equipment used: 1 person hand held assist Transfers: Sit to/from Stand, Bed to chair/wheelchair/BSC Sit to Stand: Mod assist Bed to/from chair/wheelchair/BSC transfer type:: Step pivot Step pivot transfers: Mod assist (over 2 feet.) General transfer comment: face to face assist for stability with assist forward and boost.  w/shift assist and assist to facilitate stepping with R LE. Ambulation/Gait General Gait Details: pivotal steps only with moderate assist.   ADL:   Cognition: Cognition Overall Cognitive Status: Within Functional Limits for tasks assessed Arousal/Alertness: Awake/alert Orientation Level: Oriented X4 Year: 2025 Month: January Day of Week: Correct Attention: Sustained Sustained Attention: Appears intact Memory: Appears intact Awareness: Appears intact Problem Solving: Appears intact Safety/Judgment: Appears intact Cognition Arousal: Alert Overall Cognitive Status: Within Functional Limits for tasks assessed   Blood pressure 126/82, pulse 80, temperature 98.6 F (37 C), temperature source Oral, resp. rate 12, height 5' 8 (1.727 m), weight 74.2 kg, SpO2 94%. Physical Exam Constitutional:      General: She is not in acute distress.    Appearance: She is not ill-appearing.  HENT:     Right Ear: External ear normal.     Left Ear: External ear normal.     Nose: Nose normal.  Eyes:     Conjunctiva/sclera: Conjunctivae  normal.  Cardiovascular:     Rate and Rhythm: Normal rate.  Pulmonary:     Effort: Pulmonary effort is normal.  Abdominal:     Palpations: Abdomen is soft.  Musculoskeletal:        General: No swelling or tenderness. Normal range of motion.     Cervical back: Normal range of motion.  Skin:    General: Skin is warm and dry.  Neurological:     Mental Status: She is alert.     Comments: Alert and oriented x 3. Normal insight and awareness. Intact Memory. Normal language. Speech is sl dysarthric. Cranial nerve exam unremarkable except for right central VII and right hemifacial sensory loss. MMT: RUE 3- prox to 3+ distally. RLE 3+ prox to 4- distally. LUE and LLE 4+ to 5/5. Sensory diminished to LT in right arm and leg which leads to poor coordination and position sense. No abnl resting tone.   Psychiatric:        Mood and Affect: Mood normal.  Behavior: Behavior normal.        Lab Results Last 24 Hours       Results for orders placed or performed during the hospital encounter of 10/24/23 (from the past 24 hours)  Rapid urine drug screen (hospital performed)     Status: None    Collection Time: 10/26/23  4:10 PM  Result Value Ref Range    Opiates NONE DETECTED NONE DETECTED    Cocaine NONE DETECTED NONE DETECTED    Benzodiazepines NONE DETECTED NONE DETECTED    Amphetamines NONE DETECTED NONE DETECTED    Tetrahydrocannabinol NONE DETECTED NONE DETECTED    Barbiturates NONE DETECTED NONE DETECTED  Basic metabolic panel     Status: Abnormal    Collection Time: 10/27/23  4:42 AM  Result Value Ref Range    Sodium 134 (L) 135 - 145 mmol/L    Potassium 4.2 3.5 - 5.1 mmol/L    Chloride 104 98 - 111 mmol/L    CO2 23 22 - 32 mmol/L    Glucose, Bld 108 (H) 70 - 99 mg/dL    BUN 16 8 - 23 mg/dL    Creatinine, Ser 9.13 0.44 - 1.00 mg/dL    Calcium 8.8 (L) 8.9 - 10.3 mg/dL    GFR, Estimated >39 >39 mL/min    Anion gap 7 5 - 15  CBC     Status: Abnormal    Collection Time: 10/27/23   4:42 AM  Result Value Ref Range    WBC 11.1 (H) 4.0 - 10.5 K/uL    RBC 4.60 3.87 - 5.11 MIL/uL    Hemoglobin 12.7 12.0 - 15.0 g/dL    HCT 61.2 63.9 - 53.9 %    MCV 84.1 80.0 - 100.0 fL    MCH 27.6 26.0 - 34.0 pg    MCHC 32.8 30.0 - 36.0 g/dL    RDW 85.5 88.4 - 84.4 %    Platelets 324 150 - 400 K/uL    nRBC 0.0 0.0 - 0.2 %       Imaging Results (Last 48 hours)  MR BRAIN WO CONTRAST Result Date: 10/25/2023 CLINICAL DATA:  Neuro deficit, acute, stroke suspected. EXAM: MRI HEAD WITHOUT CONTRAST TECHNIQUE: Multiplanar, multiecho pulse sequences of the brain and surrounding structures were obtained without intravenous contrast. COMPARISON:  Head CT and CTA 10/24/2023 FINDINGS: Brain: There is a 1.5 cm acute infarct involving the lateral aspect of the left thalamus and adjacent posterior limb of the internal capsule. A single chronic microhemorrhage is noted in the right frontal white matter. T2 hyperintensities in the cerebral white matter bilaterally are nonspecific but compatible with mild-to-moderate chronic small vessel ischemic disease. There is mild generalized cerebral atrophy. Vascular: Major intracranial vascular flow voids are preserved. Skull and upper cervical spine: Unremarkable bone marrow signal. Sinuses/Orbits: Bilateral cataract extraction. Paranasal sinuses and mastoid air cells are clear. Other: None. IMPRESSION: 1. Acute left thalamocapsular infarct. 2. 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 disease. Electronically Signed   By: Dasie Hamburg M.D.   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 Yes Co-Morbidities requiring supervision/potential complications:  -Post-stroke  sequelae -hypertension -hyperlipidemia  Due to bladder management, bowel management, safety, skin/wound care, disease management, medication administration, pain management, and patient education,  does the patient require 24 hr/day rehab nursing? Yes Does the patient require coordinated care of a physician, rehab nurse, therapy disciplines of PT, OT, SLP to address physical and functional deficits in the context of the above medical diagnosis(es)? Yes Addressing deficits in the following areas: balance, endurance, locomotion, strength, transferring, bowel/bladder control, bathing, dressing, feeding, grooming, toileting, speech, and psychosocial support Can the patient actively participate in an intensive therapy program of at least 3 hrs of therapy per day at least 5 days per week? Yes The potential for patient to make measurable gains while on inpatient rehab is excellent Anticipated functional outcomes upon discharge from inpatient rehab are modified independent  with PT, modified independent with OT, modified independent with SLP. Estimated rehab length of stay to reach the above functional goals is: 12-16 days Anticipated discharge destination: Home Overall Rehab/Functional Prognosis: excellent   POST ACUTE RECOMMENDATIONS: This patient's condition is appropriate for continued rehabilitative care in the following setting: CIR Patient has agreed to participate in recommended program. Yes Note that insurance prior authorization may be required for reimbursement for recommended care.   Comment: Pt is motivated to regain independence. She was active, driving prior this admit. Rehab Admissions Coordinator to follow up.         I have personally performed a face to face diagnostic evaluation of this patient. Additionally, I have examined the patient's medical record including any pertinent labs and radiographic images. If the physician assistant has documented in this note, I have reviewed and  edited or otherwise concur with the physician assistant's documentation.   Thanks,   Arthea ONEIDA Gunther, MD 10/27/2023

## 2023-10-28 NOTE — H&P (Signed)
 Physical Medicine and Rehabilitation Admission H&P        Chief Complaint  Patient presents with   Functional deficits due to stroke      HPI:  Jordan Collier is a 78 year old LH- female with history of hypercholesterolemia, HSV, psoriasis who was admitted to [facility] on 10/24/23 with acute onset of RLE weakness when she tried to get out of the car and activated EMS. CTA head/neck was negative for LVO and showed incident left thyroid  nodule 2.4 X 2.0 cm. 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 Thyroid  ultrasound done for follow up and revealed 3 cm left nodule meeting criteria for FNA and one meeting criteria for yearly surveillance. 2D  echo showed EF 65-70% with no wall abnormality, grade II diastolic dysfunction, mild to moderate MVR and mild to moderate aortic valve sclerosis. Dr. Jerri felt that stroke was due to small vessel disease and recommended DAPT X 3 weeks followed by ASA alone. Patient with right sided weakness affecting mobility and ADLs.  She requires mod assist currently and was independent PTA. CIR recommended due to functional decline.      Review of Systems  Constitutional:  Negative for chills and fever.  Eyes:  Negative for blurred vision and double vision.  Respiratory:  Negative for cough and shortness of breath.   Cardiovascular:  Negative for chest pain and palpitations.  Gastrointestinal:  Positive for constipation. Negative for heartburn and nausea.  Musculoskeletal:  Negative for back pain, myalgias and neck pain.  Neurological:  Positive for dizziness (this am), sensory change and focal weakness (RLE, right hand and right face).            Past Medical History:  Diagnosis Date   Allergy to mold     Hypercholesterolemia     Psoriasis     Trigger finger, right middle finger                 Past Surgical History:  Procedure Laterality Date   TRIGGER  FINGER RELEASE Right 01/2023   TUBAL LIGATION N/A 1980               Family History  Problem Relation Age of Onset   Hyperlipidemia Mother     Diabetes Mother     Cancer Father     Seizures Son              Social History:  Lives alone  at 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  She smoked about 20 years and reports that she quit smoking about 24 years ago. Her smoking use included cigarettes. She has never used smokeless tobacco. She reports that she does not currently use alcohol . She reports that she does not currently use drugs.     Allergies       Allergies  Allergen Reactions   Decadron [Dexamethasone] Rash   Molds & Smuts Other (See Comments)      Mucus over production   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 Other (See Comments)      Mucus over production   Penicillins Other (See Comments)      Unknown reaction Childhood allergy   Sulfa Antibiotics Other (See Comments)      Unknown reaction Childhood allergy   Lanolin Swelling      Lip swelling when using lip balm containing lanolin.   Statins Other (See Comments)  Myalgias Vision changes Severe constipation              Medications Prior to Admission  Medication Sig Dispense Refill   acetaminophen  (TYLENOL ) 500 MG tablet Take 500 mg by mouth daily as needed for moderate pain (pain score 4-6), fever or headache.       cetirizine (KLS ALLER-TEC) 10 MG tablet Take 10 mg by mouth daily as needed for allergies (sinus congestion).       Cholecalciferol  (VITAMIN D -3 PO) Take 1 tablet by mouth daily.       Cyanocobalamin  (VITAMIN B-12 PO) Take 0.5 tablets by mouth daily.       mineral oil-hydrophilic petrolatum  (AQUAPHOR) ointment Apply 1 Application topically as needed (lip irritation).       Multiple Vitamins-Minerals (MULTIVITAMIN WOMEN 50+) TABS Take 1 tablet by mouth daily.       sodium chloride  (MURO 128) 5 % ophthalmic solution Place 1 drop into both eyes See admin  instructions. Apply 1 drop into each eye up to 5 times daily.       valACYclovir  (VALTREX ) 1000 MG tablet Take 2,000 mg by mouth See admin instructions. Take 2 tablets (2000mg ) twice daily for 1 day with onset of cold sores.                  Home: Home Living Family/patient expects to be discharged to:: Assisted living Living Arrangements: Alone Available Help at Discharge:  (lives at [facility]) Type of Home: Apartment Home Access:  (incline entrance only) Home Layout: One level Bathroom Shower/Tub: Health Visitor: Pharmacist, Community: Yes Home Equipment: None  Lives With: Alone   Functional History: Prior Function Prior Level of Function : Independent/Modified Independent   Functional Status:  Mobility: Bed Mobility Overal bed mobility: Needs Assistance Bed Mobility: Sit to Sidelying Sit to sidelying: Mod assist General bed mobility comments: oob in chair on arrival Transfers Overall transfer level: Needs assistance Equipment used: Rolling walker (2 wheels) Transfers: Sit to/from Stand, Bed to chair/wheelchair/BSC Sit to Stand: Mod assist Bed to/from chair/wheelchair/BSC transfer type:: Stand pivot Stand pivot transfers: Mod assist Step pivot transfers: Mod assist (over 2 feet.) General transfer comment: cues for hand placement and correct sequencing for normalized movement Ambulation/Gait Ambulation/Gait assistance: Mod assist, +2 physical assistance, +2 safety/equipment Gait Distance (Feet): 15 Feet (x1 and 10 feet x1) Assistive device: Rolling walker (2 wheels) Gait Pattern/deviations: Step-to pattern, Decreased step length - left, Decreased step length - right, Decreased stance time - right, Decreased stride length General Gait Details: paretic gait on the R, weak R knee extension, difficult w/shift L and generally uncoodinated movement, but that said, pt was focus on task and worked hard to fatigue on proper sequencing, improving on  all the facets descibed above. Gait velocity interpretation: <1.31 ft/sec, indicative of household ambulator   ADL: ADL Overall ADL's : Needs assistance/impaired Eating/Feeding: Set up, Sitting Toilet Transfer: Moderate assistance, Stand-pivot, BSC/3in1, Grab bars Toilet Transfer Details (indicate cue type and reason): using L UE on bar to stand pivot Toileting- Clothing Manipulation and Hygiene: Maximal assistance Toileting - Clothing Manipulation Details (indicate cue type and reason): static standing Functional mobility during ADLs: +2 for physical assistance, Moderate assistance, Rolling walker (2 wheels) General ADL Comments: pt progressed from chair to [facility] over toilet using grab bar and back to chair. pt progressed with ambultion with OT /PT assistance. PT arriving for post bathroom transfer   Cognition: Cognition Overall Cognitive Status: Within Functional Limits for tasks assessed  Arousal/Alertness: Awake/alert Orientation Level: Oriented X4 Year: 2025 Month: January Day of Week: Correct Attention: Sustained Sustained Attention: Appears intact Memory: Appears intact Awareness: Appears intact Problem Solving: Appears intact Safety/Judgment: Appears intact Cognition Arousal: Alert Behavior During Therapy: WFL for tasks assessed/performed Overall Cognitive Status: Within Functional Limits for tasks assessed     Blood pressure 114/71, pulse 88, temperature 98 F (36.7 C), temperature source Oral, resp. rate 17, height 5' 8 (1.727 m), weight 74.2 kg, SpO2 97%. Physical Exam Vitals and nursing note reviewed.  Constitutional:      General: She is not in acute distress.    Appearance: Normal appearance.  HENT:     Head: Normocephalic.     Right Ear: External ear normal.     Left Ear: External ear normal.     Nose: Nose normal.  Eyes:     Extraocular Movements: Extraocular movements intact.     Conjunctiva/sclera: Conjunctivae normal.     Pupils: Pupils are equal,  round, and reactive to light.  Cardiovascular:     Rate and Rhythm: Normal rate and regular rhythm.     Heart sounds: No murmur heard.    No gallop.  Pulmonary:     Effort: Pulmonary effort is normal. No respiratory distress.     Breath sounds: Normal breath sounds. No wheezing.  Abdominal:     General: Bowel sounds are normal. There is no distension.     Tenderness: There is no abdominal tenderness.  Musculoskeletal:        General: No swelling or tenderness. Normal range of motion.     Cervical back: Normal range of motion.  Skin:    General: Skin is warm and dry.  Neurological:     Mental Status: She is alert.     Comments: Alert and oriented x 3. Normal insight and awareness. Intact Memory. Normal language with mild dysarthria. Cranial nerve exam unremarkable except for right central VII and right hemifacial sensory loss. MMT: RUE is 3 to 3+/5 prox to distal. RLE 3+/5 prox to 4/5 distal. LUE and LLE 4+ to 5/5. Sensory exam notable for 1/2 to LT and pain in right arm and leg. Decreased FMC of right side with PD. No frank ataxia.  No abnl resting tone. DTR's 1+.    Psychiatric:        Mood and Affect: Mood normal.        Behavior: Behavior normal.        Lab Results Last 48 Hours        Results for orders placed or performed during the hospital encounter of 10/24/23 (from the past 48 hours)  Rapid urine drug screen (hospital performed)     Status: None    Collection Time: 10/26/23  4:10 PM  Result Value Ref Range    Opiates NONE DETECTED NONE DETECTED    Cocaine NONE DETECTED NONE DETECTED    Benzodiazepines NONE DETECTED NONE DETECTED    Amphetamines NONE DETECTED NONE DETECTED    Tetrahydrocannabinol NONE DETECTED NONE DETECTED    Barbiturates NONE DETECTED NONE DETECTED      Comment: (NOTE) DRUG SCREEN FOR MEDICAL PURPOSES ONLY.  IF CONFIRMATION IS NEEDED FOR ANY PURPOSE, NOTIFY LAB WITHIN 5 DAYS.   LOWEST DETECTABLE LIMITS FOR URINE DRUG SCREEN Drug Class                      Cutoff (ng/mL) Amphetamine and metabolites    1000 Barbiturate and metabolites    200  Benzodiazepine                 200 Opiates and metabolites        300 Cocaine and metabolites        300 THC                            50 Performed at [facility], [address], [place], [place] [address]    Basic metabolic panel     Status: Abnormal    Collection Time: 10/27/23  4:42 AM  Result Value Ref Range    Sodium 134 (L) 135 - 145 mmol/L    Potassium 4.2 3.5 - 5.1 mmol/L    Chloride 104 98 - 111 mmol/L    CO2 23 22 - 32 mmol/L    Glucose, Bld 108 (H) 70 - 99 mg/dL      Comment: Glucose reference range applies only to samples taken after fasting for at least 8 hours.    BUN 16 8 - 23 mg/dL    Creatinine, Ser 9.13 0.44 - 1.00 mg/dL    Calcium 8.8 (L) 8.9 - 10.3 mg/dL    GFR, Estimated >39 >39 mL/min      Comment: (NOTE) Calculated using the CKD-EPI Creatinine Equation (2021)      Anion gap 7 5 - 15      Comment: Performed at [facility], [address] [place], [place] [address]  CBC     Status: Abnormal    Collection Time: 10/27/23  4:42 AM  Result Value Ref Range    WBC 11.1 (H) 4.0 - 10.5 K/uL    RBC 4.60 3.87 - 5.11 MIL/uL    Hemoglobin 12.7 12.0 - 15.0 g/dL    HCT 61.2 63.9 - 53.9 %    MCV 84.1 80.0 - 100.0 fL    MCH 27.6 26.0 - 34.0 pg    MCHC 32.8 30.0 - 36.0 g/dL    RDW 85.5 88.4 - 84.4 %    Platelets 324 150 - 400 K/uL    nRBC 0.0 0.0 - 0.2 %      Comment: Performed at [facility], [address] [place], [place] [address]  Basic metabolic panel     Status: Abnormal    Collection Time: 10/28/23  5:33 AM  Result Value Ref Range    Sodium 135 135 - 145 mmol/L    Potassium 4.2 3.5 - 5.1 mmol/L    Chloride 104 98 - 111 mmol/L    CO2 23 22 - 32 mmol/L    Glucose, Bld 101 (H) 70 - 99 mg/dL      Comment: Glucose reference range applies only to samples taken after fasting for at least 8 hours.    BUN 10 8 - 23 mg/dL    Creatinine, Ser 9.26  0.44 - 1.00 mg/dL    Calcium 9.0 8.9 - 89.6 mg/dL    GFR, Estimated >39 >39 mL/min      Comment: (NOTE) Calculated using the CKD-EPI Creatinine Equation (2021)      Anion gap 8 5 - 15      Comment: Performed at [facility], [address], [place], [place] [address]  CBC     Status: None    Collection Time: 10/28/23  5:33 AM  Result Value Ref Range    WBC 10.2 4.0 - 10.5 K/uL    RBC 4.77 3.87 -  5.11 MIL/uL    Hemoglobin 13.1 12.0 - 15.0 g/dL    HCT 60.3 63.9 - 53.9 %    MCV 83.0 80.0 - 100.0 fL    MCH 27.5 26.0 - 34.0 pg    MCHC 33.1 30.0 - 36.0 g/dL    RDW 85.5 88.4 - 84.4 %    Platelets 335 150 - 400 K/uL    nRBC 0.0 0.0 - 0.2 %      Comment: Performed at [facility], [address], [place], [place] [address]      Imaging Results (Last 48 hours)  No results found.         Blood pressure 114/71, pulse 88, temperature 98 F (36.7 C), temperature source Oral, resp. rate 17, height 5' 8 (1.727 m), weight 74.2 kg, SpO2 97%.   Medical Problem List and Plan: 1. 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              -patient may  shower             -ELOS/Goals: 12-16 days, mod I goals             -she is having some waxing and waning sensory symptoms particularly in RLE. Discussed characteristics of these, particularly after thalamic stroke. Reassured her that it wasn't the sign of a progressive infarct 2.  Antithrombotics: -DVT/anticoagulation:  Pharmaceutical: Lovenox              -antiplatelet therapy: plavix  and 81mg  asa for 3 weeks then asa alone.  3. Pain Management: Tylenol  prn. 4. Mood/Behavior/Sleep: LCSW to follow for evaluation and support.             --melatonin prn for insomnia.              -antipsychotic agents: N/A             -pt periodically experiencing depressed mood. Feels that she can work through it. Encouraged her to reach out if things should change. 5.  Neuropsych/cognition: This patient is capable of making decisions on her own behalf. 6. Skin/Wound Care: Routine pressure relief measures.  7. Fluids/Electrolytes/Nutrition: Monitor I/O. Check lytes in am.              -multivitamin daily 8. Blood pressure: Monitor TID--controlled at without meds 9. Hypercholesterolemia: LDL-196. On Zetia  and Leqvio pending approval.        Jordan GORMAN Schmitz, PA-C 10/28/2023

## 2023-10-28 NOTE — Progress Notes (Signed)
 Jordan Arthea DASEN, MD  Physician Physical Medicine and Rehabilitation   PMR Pre-admission     Signed   Date of Service: 10/27/2023  3:51 PM  Related encounter: ED to Hosp-Admission (Discharged) from 10/24/2023 in [facility]   Signed     Expand All Collapse All  PMR Admission Coordinator Pre-Admission Assessment   Patient: Jordan Collier is an 78 y.o., female MRN: [id number] DOB: [birth year] Height: 5' 8 (172.7 cm) Weight: 74.2 kg                                                                                                                                                  Insurance Information HMO:     PPO: yes     PCP:      IPA:      80/20:      OTHER:  PRIMARY: Blue Medicare      Policy#: [id number]      Subscriber: pt CM Name: Jordan Collier      Phone#: [phone]     Fax#: [phone] Pre-Cert#: [id number] auth from Jordan Collier with [facility] Medicare with updates to her at fax listed above on 1/13  Employer:  Benefits:  Phone #: [phone]     Name:  Eff. Date: 10/22/23     Deduct: $0      Out of Pocket Max: $3150 ($0 met)      Life Max:   CIR: $335/day for days 1-5      SNF: 20 full days Outpatient:      Co-Pay: $10/viist Home Health: 100%      Co-Pay:  DME: 80%     Co-Pay: 20% Providers:  SECONDARY:       Policy#:       Phone#:    Artist:       Phone#:    [employer] Information Summary" for patients in Inpatient Rehabilitation Facilities with attached "Privacy Act Statement-Health Care Records" was provided and verbally reviewed with: Patient   Emergency Contact Information Contact Information       Name Relation Home Work Wyandanch Son [phone]   [phone]         Other Contacts   None on File      Current Medical History  Patient Admitting Diagnosis: CVA    History of Present Illness: Pt is a 78 y/o female with PMH of HLD who admitted to [facility] on 10/24/23 with c/o sudden onset R  hemiparesis.  NIHSS 5.  Pt was given TNK.  CT head negative for hemorrhage, CTA head/neck negative for LVO.  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  Echo 65-70%, A1C 5.8.  Neurology recommended aspirin  and plavix  x3 weeks, then aspirin  alone.  Therapy evaluations were completed and pt was recommended for CIR.  Complete NIHSS TOTAL: 5 Glasgow Coma Scale Score: 15   Patient's medical record from [facility] has been reviewed by the rehabilitation admission coordinator and physician.   Past Medical History      Past Medical History:  Diagnosis Date   Allergy to mold     Hypercholesterolemia     Psoriasis            Has the patient had major surgery during 100 days prior to admission? No   Family History  family history is not on file.     Current Medications   Current Medications    Current Facility-Administered Medications:    acetaminophen  (TYLENOL ) tablet 650 mg, 650 mg, Oral, Q4H PRN **OR** acetaminophen  (TYLENOL ) 160 MG/5ML solution 650 mg, 650 mg, Per Tube, Q4H PRN **OR** acetaminophen  (TYLENOL ) suppository 650 mg, 650 mg, Rectal, Q4H PRN, Khaliqdina, Salman, MD   aspirin  EC tablet 81 mg, 81 mg, Oral, Daily, Judithe, Erin C, NP, 81 mg at 10/27/23 0931   Chlorhexidine  Gluconate Cloth 2 % PADS 6 each, 6 each, Topical, Daily, Khaliqdina, Salman, MD, 6 each at 10/27/23 0931   clopidogrel  (PLAVIX ) tablet 75 mg, 75 mg, Oral, Daily, Judithe Longs C, NP, 75 mg at 10/27/23 0931   enoxaparin  (LOVENOX ) injection 40 mg, 40 mg, Subcutaneous, Q24H, Lehner, Erin C, NP, 40 mg at 10/27/23 1131   ezetimibe  (ZETIA ) tablet 10 mg, 10 mg, Oral, Daily, Lehner, Erin C, NP, 10 mg at 10/27/23 0931   ondansetron  (ZOFRAN ) injection 4 mg, 4 mg, Intravenous, Q8H PRN, Bhagat, Srishti L, MD, 4 mg at 10/24/23 1955   Oral care mouth rinse, 15 mL, Mouth Rinse, PRN, Khaliqdina, Salman, MD   senna-docusate (Senokot-S) tablet 1 tablet, 1 tablet, Oral, QHS PRN,  Khaliqdina, Salman, MD     Patients Current Diet:  Diet Order                  Diet Heart Room service appropriate? Yes; Fluid consistency: Thin  Diet effective now                         Precautions / Restrictions Precautions Precautions: Fall Restrictions Weight Bearing Restrictions Per Provider Order: No    Has the patient had 2 or more falls or a fall with injury in the past year?Yes   Prior Activity Level Community (5-7x/wk): fully independent, driving, working 32 hrs/week at [employer], no DME at baseline   Prior Functional Level Prior Function Prior Level of Function : Independent/Modified Independent   Self Care: Did the patient need help bathing, dressing, using the toilet or eating?  Independent   Indoor Mobility: Did the patient need assistance with walking from room to room (with or without device)? Independent   Stairs: Did the patient need assistance with internal or external stairs (with or without device)? Independent   Functional Cognition: Did the patient need help planning regular tasks such as shopping or remembering to take medications? Independent   Patient Information Are you of Hispanic, Latino/a,or Spanish origin?: A. No, not of Hispanic, Latino/a, or Spanish origin What is your race?: A. White Do you need or want an interpreter to communicate with a doctor or health care staff?: 0. No   Patient's Response To:  Health Literacy and Transportation Is the patient able to respond to health literacy and transportation needs?: Yes Health Literacy - How often do you need to have someone help you when you read instructions, pamphlets, or other written material  from your doctor or pharmacy?: Never In the past 12 months, has lack of transportation kept you from medical appointments or from getting medications?: No In the past 12 months, has lack of transportation kept you from meetings, work, or from getting things needed for daily living?: No   Home  Assistive Devices / Equipment Home Equipment: None   Prior Device Use: Indicate devices/aids used by the patient prior to current illness, exacerbation or injury? None of the above   Current Functional Level Cognition   Arousal/Alertness: Awake/alert Overall Cognitive Status: Within Functional Limits for tasks assessed Orientation Level: Oriented X4 Attention: Sustained Sustained Attention: Appears intact Memory: Appears intact Awareness: Appears intact Problem Solving: Appears intact Safety/Judgment: Appears intact    Extremity Assessment (includes Sensation/Coordination)   Upper Extremity Assessment: RUE deficits/detail RUE Deficits / Details: AROM full ROM, pt with decreased coordinated and fine motor RUE Sensation: decreased proprioception, decreased light touch RUE Coordination: decreased gross motor, decreased fine motor  Lower Extremity Assessment: Defer to PT evaluation RLE Coordination: decreased fine motor     ADLs   Overall ADL's : Needs assistance/impaired Eating/Feeding: Set up, Sitting Toilet Transfer: Moderate assistance, Stand-pivot, BSC/3in1, Grab bars Toilet Transfer Details (indicate cue type and reason): using L UE on bar to stand pivot Toileting- Clothing Manipulation and Hygiene: Maximal assistance Toileting - Clothing Manipulation Details (indicate cue type and reason): static standing Functional mobility during ADLs: +2 for physical assistance, Moderate assistance, Rolling walker (2 wheels) General ADL Comments: pt progressed from chair to [facility] over toilet using grab bar and back to chair. pt progressed with ambultion with OT /PT assistance. PT arriving for post bathroom transfer     Mobility   Overal bed mobility: Needs Assistance Bed Mobility: Sit to Sidelying Sit to sidelying: Mod assist General bed mobility comments: oob in chair on arrival     Transfers   Overall transfer level: Needs assistance Equipment used: Rolling walker (2  wheels) Transfers: Sit to/from Stand, Bed to chair/wheelchair/BSC Sit to Stand: Mod assist Bed to/from chair/wheelchair/BSC transfer type:: Stand pivot Stand pivot transfers: Mod assist Step pivot transfers: Mod assist (over 2 feet.) General transfer comment: cues for hand placement and correct sequencing for normalized movement     Ambulation / Gait / Stairs / Wheelchair Mobility   Ambulation/Gait Ambulation/Gait assistance: Mod assist, +2 physical assistance, +2 safety/equipment Gait Distance (Feet): 15 Feet (x1 and 10 feet x1) Assistive device: Rolling walker (2 wheels) Gait Pattern/deviations: Step-to pattern, Decreased step length - left, Decreased step length - right, Decreased stance time - right, Decreased stride length General Gait Details: paretic gait on the R, weak R knee extension, difficult w/shift L and generally uncoodinated movement, but that said, pt was focus on task and worked hard to fatigue on proper sequencing, improving on all the facets descibed above. Gait velocity interpretation: <1.31 ft/sec, indicative of household ambulator     Posture / Balance Dynamic Sitting Balance Sitting balance - Comments: needs UE assist for challenged balance. Balance Overall balance assessment: Needs assistance Sitting-balance support: Bilateral upper extremity supported, Feet supported Sitting balance-Leahy Scale: Fair Sitting balance - Comments: needs UE assist for challenged balance. Standing balance support: Bilateral upper extremity supported, During functional activity, Reliant on assistive device for balance Standing balance-Leahy Scale: Poor Standing balance comment: pt reliant on external support in standing/ pre-gait activity.     Special needs/care consideration Diabetic management pre-diabetes        Previous Home Environment (from acute therapy documentation) Living Arrangements: Alone  Lives With: Alone Available Help at Discharge:  (lives at [facility]) Type of Home: Apartment Home Layout: One level Home Access:  (incline entrance only) Bathroom Shower/Tub: Health Visitor: Pharmacist, Community: Yes Home Care Services: No   Discharge Living Setting Plans for Discharge Living Setting: Patient's home, Apartment (ILF) Type of Home at Discharge: Independent living facility Care Facility Name at Discharge: [facility] Discharge Home Layout: One level Discharge Home Access: Level entry Discharge Bathroom Shower/Tub: Walk-in shower Discharge Bathroom Toilet: Handicapped height Discharge Bathroom Accessibility: Yes How Accessible: Accessible via walker Does the patient have any problems obtaining your medications?: No   Social/Family/Support Systems Anticipated Caregiver: mod I goals per PMR consult, contact is her son, Donnice Anticipated Industrial/product Designer Information: [phone] Ability/Limitations of Caregiver: n/a Caregiver Availability: Other (Comment) Discharge Plan Discussed with Primary Caregiver: Yes Is Caregiver In Agreement with Plan?: Yes Does Caregiver/Family have Issues with Lodging/Transportation while Pt is in Rehab?: No     Goals Patient/Family Goal for Rehab: PT/OT/SLP mod I Expected length of stay: 12-16 days Additional Information: Discharge plan: mod I goals, return to [employer] at discharge Pt/Family Agrees to Admission and willing to participate: Yes Program Orientation Provided & Reviewed with Pt/Caregiver Including Roles  & Responsibilities: Yes     Decrease burden of Care through IP rehab admission: n/a     Possible need for SNF placement upon discharge:not anticipated, plan for d/c back to [facility] (ILF) at mod I level.      Patient Condition: This patient's condition remains as documented in the consult dated 10/27/23, in which the Rehabilitation Physician determined and documented that the patient's condition is appropriate for intensive rehabilitative  care in an inpatient rehabilitation facility. Will admit to inpatient rehab today.   Preadmission Screen Completed By:  Reche FORBES Lowers, PT, DPT 10/27/2023 3:52 PM ______________________________________________________________________   Discussed status with Dr. Babs on 10/28/23 at  10:25 AM  and received approval for admission today.   Admission Coordinator:  Thomasenia Dowse E Doralyn Kirkes, PT, DPT time 10:25 AM Pattricia 10/28/23              Revision History

## 2023-10-29 ENCOUNTER — Telehealth: Payer: Self-pay | Admitting: Physical Medicine and Rehabilitation

## 2023-10-29 DIAGNOSIS — I6381 Other cerebral infarction due to occlusion or stenosis of small artery: Secondary | ICD-10-CM | POA: Diagnosis not present

## 2023-10-29 LAB — COMPREHENSIVE METABOLIC PANEL
ALT: 17 U/L (ref 0–44)
AST: 17 U/L (ref 15–41)
Albumin: 3.1 g/dL — ABNORMAL LOW (ref 3.5–5.0)
Alkaline Phosphatase: 54 U/L (ref 38–126)
Anion gap: 6 (ref 5–15)
BUN: 16 mg/dL (ref 8–23)
CO2: 23 mmol/L (ref 22–32)
Calcium: 8.8 mg/dL — ABNORMAL LOW (ref 8.9–10.3)
Chloride: 105 mmol/L (ref 98–111)
Creatinine, Ser: 0.92 mg/dL (ref 0.44–1.00)
GFR, Estimated: >60 mL/min (ref >60)
Glucose, Bld: 104 mg/dL — ABNORMAL HIGH (ref 70–99)
Potassium: 4.6 mmol/L (ref 3.5–5.1)
Sodium: 134 mmol/L — ABNORMAL LOW (ref 135–145)
Total Bilirubin: 0.7 mg/dL (ref 0.0–1.2)
Total Protein: 5.8 g/dL — ABNORMAL LOW (ref 6.5–8.1)

## 2023-10-29 LAB — CBC WITH DIFFERENTIAL/PLATELET
Abs Immature Granulocytes: 0.04 10*3/uL (ref 0.00–0.07)
Basophils Absolute: 0.1 10*3/uL (ref 0.0–0.1)
Basophils Relative: 1 %
Eosinophils Absolute: 0.8 10*3/uL — ABNORMAL HIGH (ref 0.0–0.5)
Eosinophils Relative: 9 %
HCT: 37.8 % (ref 36.0–46.0)
Hemoglobin: 12.6 g/dL (ref 12.0–15.0)
Immature Granulocytes: 0 %
Lymphocytes Relative: 30 %
Lymphs Abs: 2.7 10*3/uL (ref 0.7–4.0)
MCH: 27.6 pg (ref 26.0–34.0)
MCHC: 33.3 g/dL (ref 30.0–36.0)
MCV: 82.9 fL (ref 80.0–100.0)
Monocytes Absolute: 0.8 10*3/uL (ref 0.1–1.0)
Monocytes Relative: 9 %
Neutro Abs: 4.8 10*3/uL (ref 1.7–7.7)
Neutrophils Relative %: 51 %
Platelets: 318 10*3/uL (ref 150–400)
RBC: 4.56 MIL/uL (ref 3.87–5.11)
RDW: 14.2 % (ref 11.5–15.5)
WBC: 9.3 10*3/uL (ref 4.0–10.5)
nRBC: 0 % (ref 0.0–0.2)

## 2023-10-29 LAB — VITAMIN D 25 HYDROXY (VIT D DEFICIENCY, FRACTURES): Vit D, 25-Hydroxy: 29.75 ng/mL — ABNORMAL LOW (ref 30–100)

## 2023-10-29 LAB — MAGNESIUM: Magnesium: 2.1 mg/dL (ref 1.7–2.4)

## 2023-10-29 NOTE — Progress Notes (Signed)
 Inpatient Rehabilitation Center Individual Statement of Services  Patient Name:  Jordan Collier  Date:  10/29/2023  Welcome to the Inpatient Rehabilitation Center.  Our goal is to provide you with an individualized program based on your diagnosis and situation, designed to meet your specific needs.  With this comprehensive rehabilitation program, you will be expected to participate in at least 3 hours of rehabilitation therapies Monday-Friday, with modified therapy programming on the weekends.  Your rehabilitation program will include the following services:  Physical Therapy (PT), Occupational Therapy (OT), Speech Therapy (ST), 24 hour per day rehabilitation nursing, Care Coordinator, Rehabilitation Medicine, Nutrition Services, and Pharmacy Services  Weekly team conferences will be held on Wednesday to discuss your progress.  Your Inpatient Rehabilitation Care Coordinator will talk with you frequently to get your input and to update you on team discussions.  Team conferences with you and your family in attendance may also be held.  Expected length of stay: 12-14 days  Overall anticipated outcome: supervision-mod/I level  Depending on your progress and recovery, your program may change. Your Inpatient Rehabilitation Care Coordinator will coordinate services and will keep you informed of any changes. Your Inpatient Rehabilitation Care Coordinator's name and contact numbers are listed  below.  The following services may also be recommended but are not provided by the Inpatient Rehabilitation Center:  Driving Evaluations Home Health Rehabiltiation Services Outpatient Rehabilitation Services Vocational Rehabilitation   Arrangements will be made to provide these services after discharge if needed.  Arrangements include referral to agencies that provide these services.  Your insurance has been verified to be:  [employer] Your primary doctor is: Not in system  Pertinent information will be  shared with your doctor and your insurance company.  Inpatient Rehabilitation Care Coordinator:  Rhoda Clement, KEN [phone] or ELIGAH BASQUES  Information discussed with and copy given to patient by: Clement Asberry MATSU, 10/29/2023, 9:41 AM

## 2023-10-29 NOTE — Patient Care Conference (Signed)
 Inpatient RehabilitationTeam Conference and Plan of Care Update Date: 10/29/2023   Time: 11:14 AM    Patient Name: Jordan Collier      Medical Record Number: [id number]  Date of Birth: December 03, 1945 Sex: Female         Room/Bed: 4W19C/4W19C-01 Payor Info: Payor: BLUE CROSS BLUE [employer]IELD MEDICARE / Plan: BCBS MEDICARE / Product Type: *No Product type* /    Admit Date/Time:  10/28/2023  2:05 PM  Primary Diagnosis:  Thalamic stroke [facility])  Hospital Problems: Principal Problem:   Thalamic stroke [facility])    Expected Discharge Date: Expected Discharge Date:  (ELOS: 12-14 days)  Team Members Present: Physician leading conference: Dr. Sven Elks Social Worker Present: Rhoda Clement, LCSW Nurse Present: Barnie Ronde, RN;Raniah Karan Hilliard, RN PT Present: Sherlean Perks, PT OT Present: Delon Sharps, OT SLP Present: Rosina Downy, SLP     Current Status/Progress Goal Weekly Team Focus  Bowel/Bladder   Pt is currently continent B/B  LBM 10/28/23   Will retain B/B continence with normal pattern   Assit with toileting needs qshift/prn    Swallow/Nutrition/ Hydration   eval pending           ADL's   evals pending            Mobility   PT EVAL PENDING           Communication   eval pending            Safety/Cognition/ Behavioral Observations  eval pending            Pain   Denies pain at this time   Pt will be free from pain   Assess pt for pain qshift/prn and provide education on pain medication    Skin   Skin is intact with rash to buttocks   Will maintain skin intergrity  Assess skin qshift/prn and provide education to prevent skin breakdown      Discharge Planning:  New evaluation-going to [employer] prior to going back to her independent apartment. Son can not assist due to own health issues   Team Discussion: Thalamic stroke. Labs stable. Low protein levels. Time toileting. Checking blood sugar once a day prior to dinner. Redness to  buttocks that is being treated with Microguard powder. Independent living at [facility] Wants to return to their facility where she is established with their medical staff including their rehab.  Therapy evaluations pending. Tolerating heart healthy diet.  Patient on target to meet rehab goals: Evaluations pending with 12-14 day LOS  *See Care Plan and progress notes for long and short-term goals.   Revisions to Treatment Plan:  Watching blood sugars.  Monitor labs and VS Teaching Needs: Medications, safety, self care, gait/transfer training, diet modification, toileting, etc.   Current Barriers to Discharge: Decreased caregiver support  Possible Resolutions to Barriers: Family education Adhere to diet modification Order recommended DME     Medical Summary Current Status: CVA, constipation, allergies, history of cold sores, hyperglycemia, low protein  Barriers to Discharge: Medical stability  Barriers to Discharge Comments: CVA, constipation, allergies, history of cold sores, hypergglycemia, low protein Possible Resolutions to [employer] Focus: continue aspirin , plavix , Zetia , continue Senokot, continue certrizine, continue Valtrex , conitnue B12 supplement/multivitamin, CBG check ordered before dinner   Continued Need for Acute Rehabilitation Level of Care: The patient requires daily medical management by a physician with specialized training in physical medicine and rehabilitation for the following reasons: Direction of a multidisciplinary physical rehabilitation program to maximize functional independence :  Yes Medical management of patient stability for increased activity during participation in an intensive rehabilitation regime.: Yes Analysis of laboratory values and/or radiology reports with any subsequent need for medication adjustment and/or medical intervention. : Yes   I attest that I was present, lead the team conference, and concur with the assessment and plan of  the team.   Darice LITTIE Boring 10/29/2023, 6:29 PM

## 2023-10-29 NOTE — Telephone Encounter (Signed)
 error

## 2023-10-29 NOTE — Evaluation (Signed)
 Speech Language Pathology Assessment and Plan  Patient Details  Name: Jordan Collier MRN: [id number] Date of Birth: Mar 10, 1946  SLP Diagnosis:    Rehab Potential:  ELOS:     Today's Date: 10/29/2023 SLP Individual Time: 8982-8885 SLP Individual Time Calculation (min): 57 min   Hospital Problem: Principal Problem:   Thalamic stroke [facility])  Past Medical History:  Past Medical History:  Diagnosis Date   Allergy to mold    Hypercholesterolemia    Psoriasis    Trigger finger, right middle finger    Past Surgical History:  Past Surgical History:  Procedure Laterality Date   TRIGGER FINGER RELEASE Right 01/2023   TUBAL LIGATION N/A 1980    Assessment / Plan / Recommendation Clinical Impression  Jordan Collier is a 78 year old LH- female with history of hypercholesterolemia, HSV, psoriasis who was admitted to [facility] on 10/24/23 with acute onset of RLE weakness when she tried to get out of the car and activated EMS. CTA head/neck was negative for LVO and showed incident left thyroid  nodule 2.4 X 2.0 cm. 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 Thyroid  ultrasound done for follow up and revealed 3 cm left nodule meeting criteria for FNA and one meeting criteria for yearly surveillance. 2D echo showed EF 65-70% with no wall abnormality, grade II diastolic dysfunction, mild to moderate MVR and mild to moderate aortic valve sclerosis. Dr. Jerri felt that stroke was due to small vessel disease and recommended DAPT X 3 weeks followed by ASA alone. Patient with right sided weakness affecting mobility and ADLs. She requires mod assist currently and was independent PTA. CIR recommended due to functional decline. She was admitted to CIR on 10/28/23.  Cognitive Linguistic and Speech: COGNISTAT administered with all subsections WFL. Informal assessment revealed pt with awareness of current limitations  including, altered speech and feeling slower than normal. She denied changes in cognition and/ or swallowing. Pt verbalized her recent medical hx and PLOF accurately. She appropriately engaged in conversation with SLP with adequate topic maintenance, turn taking and 100% intelligibility. Additionally, pt demo ability to utilize her phone including voice to text feature and navigating phone to make a call. Pt reports fluctuations in speech based on lethargy. Education completed on energy conservation methods, over articulation, pausing, and brain health with focus on cognitive stimulation. Pt verbalized understanding of education with ability to add insight into SLP teaching. No further skilled intervention warranted at this time.   Swallowing: Pt consumed thin liquid via cup edge and straw both via single and consecutive sips with no overt s/s aspiration. Pt denies difficulty with regular solids. SLP recommending continuation of regular solids and thin liquids.    Skilled Therapeutic Interventions          Informal assessment measures and COGNISTAT administered. Please see full report for additional details.     SLP Assessment  Patient does not need any further Speech Lanaguage Pathology Services    Recommendations  SLP Diet Recommendations: Age appropriate regular solids;Thin Liquid Administration via: Cup;Straw Medication Administration: Whole meds with liquid Supervision: Patient able to self feed Compensations: Small sips/bites Postural Changes and/or Swallow Maneuvers: Seated upright 90 degrees Oral Care Recommendations: Oral care BID Patient destination: Home (ILF) Follow up Recommendations: None Equipment Recommended: None recommended by SLP    SLP Frequency     SLP Duration  SLP Intensity  SLP Treatment/Interventions  Pain Pain Assessment Pain Scale: 0-10 Pain Score: 0-No pain  Prior Functioning Cognitive/Linguistic Baseline: Within functional limits Type of  Home: Independent living facility  Lives With: Alone Available Help at Discharge: Other (Comment) (Lives in a [facility] could get additional assitance if needed) Vocation: Part time employment  SLP Evaluation Cognition Overall Cognitive Status: Within Functional Limits for tasks assessed Arousal/Alertness: Awake/alert Orientation Level: Oriented X4 Year: 2025 Month: January Day of Week: Correct Attention: Sustained Sustained Attention: Appears intact Memory: Appears intact Awareness: Appears intact Problem Solving: Appears intact Executive Function: Sequencing;Initiating;Decision Making Sequencing: Appears intact Decision Making: Appears intact Initiating: Appears intact Safety/Judgment: Appears intact  Comprehension Auditory Comprehension Overall Auditory Comprehension: Appears within functional limits for tasks assessed Conversation: Complex Visual Recognition/Discrimination Discrimination: Not tested Reading Comprehension Reading Status: Not tested Expression Expression Primary Mode of Expression: Verbal Verbal Expression Overall Verbal Expression: Appears within functional limits for tasks assessed Initiation: No impairment Level of Generative/Spontaneous Verbalization: Conversation Repetition: No impairment Naming: No impairment Confrontation: Within functional limits Convergent: Not tested Pragmatics: No impairment Non-Verbal Means of Communication: Not applicable Written Expression Dominant Hand: Left Written Expression: Not tested Oral Motor Oral Motor/Sensory Function Overall Oral Motor/Sensory Function: Mild impairment Facial ROM: Reduced right Facial Symmetry: Abnormal symmetry right Facial Sensation: Reduced right Lingual ROM: Within Functional Limits Lingual Symmetry: Within Functional Limits Lingual Strength: Within Functional Limits Lingual Sensation: Within Functional Limits Velum: Within Functional Limits Mandible: Within Functional Limits Motor  Speech Overall Motor Speech: Appears within functional limits for tasks assessed Respiration: Within functional limits Phonation: Normal Resonance: Within functional limits Articulation: Impaired Level of Impairment: Conversation Intelligibility: Intelligibility reduced Word: 75-100% accurate Phrase: 75-100% accurate Sentence: 75-100% accurate Conversation: 75-100% accurate Motor Planning: Witnin functional limits Motor Speech Errors: Not applicable Effective Techniques: Over-articulate;Pause  Care Tool Care Tool Cognition Ability to hear (with hearing aid or hearing appliances if normally used Ability to hear (with hearing aid or hearing appliances if normally used): 0. Adequate - no difficulty in normal conservation, social interaction, listening to TV   Expression of Ideas and Wants Expression of Ideas and Wants: 3. Some difficulty - exhibits some difficulty with expressing needs and ideas (e.g, some words or finishing thoughts) or speech is not clear   Understanding Verbal and Non-Verbal Content Understanding Verbal and Non-Verbal Content: 4. Understands (complex and basic) - clear comprehension without cues or repetitions  Memory/Recall Ability Memory/Recall Ability : Current season;That he or she is in a hospital/hospital unit   Bedside Swallowing Assessment General History of Recent Intubation: No Behavior/Cognition: Alert Oral Cavity - Dentition: Adequate natural dentition Self-Feeding Abilities: Able to feed self Patient Positioning: Upright in chair/Tumbleform Baseline Vocal Quality: Normal  Oral Care Assessment Oral Assessment  (WDL): Within Defined Limits Lips: Asymmetrical Teeth: Dentures upper;Dentures lower Tongue: Pink;Moist Mucous Membrane(s): Pink;Moist Saliva: Moist, saliva free flowing Level of Consciousness: Alert Is patient on any of following O2 devices?: None of the above Nutritional status: No high risk factors Oral Assessment Risk : Low Risk Ice  Chips Ice chips: Not tested Thin Liquid Thin Liquid: Within functional limits Nectar Thick Nectar Thick Liquid: Not tested Honey Thick Honey Thick Liquid: Not tested Puree Puree: Not tested Solid Solid: Not tested BSE Assessment Risk for Aspiration Impact on safety and function: No limitations  Short Term Goals: Week 1:   Refer to Care Plan for Long Term Goals  Recommendations for other services: None   Discharge Criteria: Patient will be discharged from SLP if patient refuses treatment 3 consecutive times without medical  reason, if treatment goals not met, if there is a change in medical status, if patient makes no progress towards goals or if patient is discharged from hospital.  The above assessment, treatment plan, treatment alternatives and goals were discussed and mutually agreed upon: by patient  Joane GORMAN Fuss 10/29/2023, 12:02 PM

## 2023-10-29 NOTE — Progress Notes (Signed)
 Inpatient Rehabilitation Care Coordinator Assessment and Plan Patient Details  Name: Jordan Collier MRN: [id number] Date of Birth: 1946/09/14  Today's Date: 10/29/2023  Hospital Problems: Principal Problem:   Thalamic stroke [facility])  Past Medical History:  Past Medical History:  Diagnosis Date   Allergy to mold    Hypercholesterolemia    Psoriasis    Trigger finger, right middle finger    Past Surgical History:  Past Surgical History:  Procedure Laterality Date   TRIGGER FINGER RELEASE Right 01/2023   TUBAL LIGATION N/A 1980   Social History:  reports that she quit smoking about 24 years ago. Her smoking use included cigarettes. She has never used smokeless tobacco. She reports that she does not currently use alcohol . She reports that she does not currently use drugs.  Family / Support Systems Marital Status: Divorced Patient Roles: Parent, Other (Comment) (employee) Children: Jordan Collier [phone] was a pt here in 06/2023 still getting OP therapies Other Supports: neighbors Anticipated Caregiver: Self may need to go to rehab at [facility] then return to her apartment Ability/Limitations of Caregiver: may need rehab after, has no caregiver Caregiver Availability: Other (Comment) (probsbly will go to rehab at [facility] at [place]) Family Dynamics: Close with son and she is the one that facilitates his care and appointments. He is supportive but can not assist her  Social History Preferred language: English Religion: Christian Cultural Background: No issues Education: HS Health Literacy - How often do you need to have someone help you when you read instructions, pamphlets, or other written material from your doctor or pharmacy?: Never Writes: Yes Employment Status: Employed Name of Employer: [employer] part time Return to Work Plans: Unsure will need to recover first before thinking about going back to work Marine Scientist Issues: No issues Guardian/Conservator:  None-according to MD pt is capable of making her own decisions while here   Abuse/Neglect Abuse/Neglect Assessment Can Be Completed: Yes Physical Abuse: Denies Verbal Abuse: Denies Sexual Abuse: Denies Exploitation of patient/patient's resources: Denies Self-Neglect: Denies  Patient response to: Social Isolation - How often do you feel lonely or isolated from those around you?: Rarely  Emotional Status Pt's affect, behavior and adjustment status: Pt is motivated to improve and recover, she is usually the one who is together and now she needs assist which is not a role she is used too. Recent Psychosocial Issues: other health issues-son's care Psychiatric History: No history seems to be coping appropriately and able to verbalize her feelsings and concerns Substance Abuse History: NA  Patient / Family Perceptions, Expectations & Goals Pt/Family understanding of illness & functional limitations: Pt is able to explain her stroke and deficits she has. She does talk with the MD inolved and hopes to do well here and is quite familar with this unit due to son was here in 06/2023 Premorbid pt/family roles/activities: mom, employee, grandma, neighbor Anticipated changes in roles/activities/participation: resume Pt/family expectations/goals: Pt states:  I  hope to recover but do plan to go to the rehab part before going back to my apartment at [facility].  Community Resources [employer]: Other (Comment) (resident of [facility]) Premorbid Home Care/DME Agencies: None Transportation available at discharge: self Is the patient able to respond to transportation needs?: Yes In the past 12 months, has lack of transportation kept you from medical appointments or from getting medications?: No In the past 12 months, has lack of transportation kept you from meetings, work, or from getting things needed for daily living?: No  Discharge  Planning Living Arrangements: Alone Support  Systems: Children, Friends/neighbors Type of Residence: Independent Living Insurance Resources: Media Planner (specify) (Blue Medicare) Financial Resources: Employment, Restaurant Manager, Fast Food Screen Referred: No Living Expenses: Rent Money Management: Patient Does the patient have any problems obtaining your medications?: No Home Management: self Patient/Family Preliminary Plans: Plans to return to [employer] but go to the rehab part then transition back to her apartment. Will reach out to [facility] once know who long pt will be here. Care Coordinator Anticipated Follow Up Needs: HH/OP, SNF  Clinical Impression Pleasant female who is quite familiar with this unit since was just here with her son in 06/2023. She lives in the ILF at [facility] and plans to go to the rehab then home once leaves here. Will reach out to them when have a discharge date.  Jordan Collier Jordan Collier 10/29/2023, 9:40 AM

## 2023-10-29 NOTE — Plan of Care (Signed)
  Problem: RH Balance Goal: LTG Patient will maintain dynamic standing with ADLs (OT) Description: LTG:  Patient will maintain dynamic standing balance with assist during activities of daily living (OT)  Flowsheets (Taken 10/29/2023 1200) LTG: Pt will maintain dynamic standing balance during ADLs with: Independent with assistive device   Problem: Sit to Stand Goal: LTG:  Patient will perform sit to stand in prep for activites of daily living with assistance level (OT) Description: LTG:  Patient will perform sit to stand in prep for activites of daily living with assistance level (OT) Flowsheets (Taken 10/29/2023 1200) LTG: PT will perform sit to stand in prep for activites of daily living with assistance level: Independent with assistive device   Problem: RH Grooming Goal: LTG Patient will perform grooming w/assist,cues/equip (OT) Description: LTG: Patient will perform grooming with assist, with/without cues using equipment (OT) Flowsheets (Taken 10/29/2023 1200) LTG: Pt will perform grooming with assistance level of: Independent with assistive device    Problem: RH Bathing Goal: LTG Patient will bathe all body parts with assist levels (OT) Description: LTG: Patient will bathe all body parts with assist levels (OT) Flowsheets (Taken 10/29/2023 1200) LTG: Pt will perform bathing with assistance level/cueing: Independent with assistive device    Problem: RH Dressing Goal: LTG Patient will perform upper body dressing (OT) Description: LTG Patient will perform upper body dressing with assist, with/without cues (OT). Flowsheets (Taken 10/29/2023 1200) LTG: Pt will perform upper body dressing with assistance level of: Independent with assistive device Goal: LTG Patient will perform lower body dressing w/assist (OT) Description: LTG: Patient will perform lower body dressing with assist, with/without cues in positioning using equipment (OT) Flowsheets (Taken 10/29/2023 1200) LTG: Pt will perform lower  body dressing with assistance level of: Independent with assistive device   Problem: RH Toileting Goal: LTG Patient will perform toileting task (3/3 steps) with assistance level (OT) Description: LTG: Patient will perform toileting task (3/3 steps) with assistance level (OT)  Flowsheets (Taken 10/29/2023 1200) LTG: Pt will perform toileting task (3/3 steps) with assistance level: Independent with assistive device   Problem: RH Functional Use of Upper Extremity Goal: LTG Patient will use RT/LT upper extremity as a (OT) Description: LTG: Patient will use right/left upper extremity as a stabilizer/gross assist/diminished/nondominant/dominant level with assist, with/without cues during functional activity (OT) Flowsheets (Taken 10/29/2023 1200) LTG: Use of upper extremity in functional activities: RUE as gross assist level LTG: Pt will use upper extremity in functional activity with assistance level of: Independent with assistive device   Problem: RH Simple Meal Prep Goal: LTG Patient will perform simple meal prep w/assist (OT) Description: LTG: Patient will perform simple meal prep with assistance, with/without cues (OT). Flowsheets (Taken 10/29/2023 1200) LTG: Pt will perform simple meal prep with assistance level of: Independent with assistive device   Problem: RH Toilet Transfers Goal: LTG Patient will perform toilet transfers w/assist (OT) Description: LTG: Patient will perform toilet transfers with assist, with/without cues using equipment (OT) Flowsheets (Taken 10/29/2023 1200) LTG: Pt will perform toilet transfers with assistance level of: Independent with assistive device   Problem: RH Tub/Shower Transfers Goal: LTG Patient will perform tub/shower transfers w/assist (OT) Description: LTG: Patient will perform tub/shower transfers with assist, with/without cues using equipment (OT) Flowsheets (Taken 10/29/2023 1200) LTG: Pt will perform tub/shower stall transfers with assistance level of:  Independent with assistive device

## 2023-10-29 NOTE — Progress Notes (Signed)
 PROGRESS NOTE   Subjective/Complaints: No new complaints this morning Discussed blood work Discussed her former work, current medications  ROS: +feels cold at 3am   Objective:   No results found. Recent Labs    10/28/23 0533 10/29/23 0514  WBC 10.2 9.3  HGB 13.1 12.6  HCT 39.6 37.8  PLT 335 318   Recent Labs    10/28/23 0533 10/29/23 0514  NA 135 134*  K 4.2 4.6  CL 104 105  CO2 23 23  GLUCOSE 101* 104*  BUN 10 16  CREATININE 0.73 0.92  CALCIUM 9.0 8.8*    Intake/Output Summary (Last 24 hours) at 10/29/2023 1225 Last data filed at 10/29/2023 0756 Gross per 24 hour  Intake 480 ml  Output --  Net 480 ml        Physical Exam: Vital Signs Blood pressure 123/75, pulse 74, temperature 98 F (36.7 C), resp. rate 18, height 5' 8 (1.727 m), weight 67.7 kg, SpO2 95%. Gen: no distress, normal appearing HEENT: oral mucosa pink and moist, NCAT Cardio: Reg rate Chest: normal effort, normal rate of breathing Abd: soft, non-distended Ext: no edema Psych: pleasant, normal affect Musculoskeletal:        General: No swelling or tenderness. Normal range of motion.     Cervical back: Normal range of motion.  Skin:    General: Skin is warm and dry.  Neurological:     Mental Status: She is alert.     Comments: Alert and oriented x 3. Normal insight and awareness. Intact Memory. Normal language with mild dysarthria. Cranial nerve exam unremarkable except for right central VII and right hemifacial sensory loss. MMT: RUE is 3 to 3+/5 prox to distal. RLE 3+/5 prox to 4/5 distal. LUE and LLE 4+ to 5/5. Sensory exam notable for 1/2 to LT and pain in right arm and leg. Decreased FMC of right side with PD. No frank ataxia.  No abnl resting tone. DTR's 1+.    Psychiatric:        Mood and Affect: Mood normal.        Behavior: Behavior normal.    Assessment/Plan: 1. Functional deficits which require 3+ hours per day of  interdisciplinary therapy in a comprehensive inpatient rehab setting. Physiatrist is providing close team supervision and 24 hour management of active medical problems listed below. Physiatrist and rehab team continue to assess barriers to discharge/monitor patient progress toward functional and medical goals  Care Tool:  Bathing    Body parts bathed by patient: Right arm, Left arm, Chest, Abdomen, Front perineal area, Right upper leg, Left upper leg, Face   Body parts bathed by helper: Buttocks, Right lower leg, Left lower leg     Bathing assist Assist Level: Moderate Assistance - Patient 50 - 74%     Upper Body Dressing/Undressing Upper body dressing   What is the patient wearing?: Pull over shirt    Upper body assist Assist Level: Supervision/Verbal cueing    Lower Body Dressing/Undressing Lower body dressing      What is the patient wearing?: Pants     Lower body assist Assist for lower body dressing: Minimal Assistance - Patient > 75%  Toileting Toileting    Toileting assist Assist for toileting: Moderate Assistance - Patient 50 - 74%     Transfers Chair/bed transfer  Transfers assist           Locomotion Ambulation   Ambulation assist              Walk 10 feet activity   Assist           Walk 50 feet activity   Assist           Walk 150 feet activity   Assist           Walk 10 feet on uneven surface  activity   Assist           Wheelchair     Assist               Wheelchair 50 feet with 2 turns activity    Assist            Wheelchair 150 feet activity     Assist          Blood pressure 123/75, pulse 74, temperature 98 F (36.7 C), resp. rate 18, height 5' 8 (1.727 m), weight 67.7 kg, SpO2 95%.  Medical Problem List and Plan: 1. 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              -patient may  shower              -ELOS/Goals: 12-16 days, mod I goals             -she is having some waxing and waning sensory symptoms particularly in RLE. Discussed characteristics of these, particularly after thalamic stroke. Reassured her that it wasn't the sign of a progressive infarct  Team conference held 1/8 2.  Antithrombotics: -DVT/anticoagulation:  Pharmaceutical: Lovenox              -antiplatelet therapy: plavix  and 81mg  asa for 3 weeks then asa alone.  3. Pain Management: Tylenol  prn. 4. Mood/Behavior/Sleep: LCSW to follow for evaluation and support.             --melatonin prn for insomnia.              -antipsychotic agents: N/A             -pt periodically experiencing depressed mood. Feels that she can work through it. Encouraged her to reach out if things should change. 5. Neuropsych/cognition: This patient is capable of making decisions on her own behalf. 6. Skin/Wound Care: Routine pressure relief measures.  7. Fluids/Electrolytes/Nutrition: Monitor I/O. Check lytes in am.              -multivitamin daily  8. Blood pressure: Monitor TID--controlled at without meds  9. Hypercholesterolemia: LDL-196. Continue Zetia  and Leqvio pending approval.   10. Hyperglycemia: CBG ordered prior to dinner  11. Low protein: educated patient regarding levels     LOS: 1 days A FACE TO FACE EVALUATION WAS PERFORMED  Sven SQUIBB Jalene Lacko 10/29/2023, 12:25 PM

## 2023-10-29 NOTE — Plan of Care (Signed)
  Problem: RH Balance Goal: LTG Patient will maintain dynamic standing balance (PT) Description: LTG:  Patient will maintain dynamic standing balance with assistance during mobility activities (PT) Flowsheets (Taken 10/29/2023 1356) LTG: Pt will maintain dynamic standing balance during mobility activities with:: Supervision/Verbal cueing   Problem: Sit to Stand Goal: LTG:  Patient will perform sit to stand with assistance level (PT) Description: LTG:  Patient will perform sit to stand with assistance level (PT) Flowsheets (Taken 10/29/2023 1356) LTG: PT will perform sit to stand in preparation for functional mobility with assistance level: Supervision/Verbal cueing   Problem: RH Bed Mobility Goal: LTG Patient will perform bed mobility with assist (PT) Description: LTG: Patient will perform bed mobility with assistance, with/without cues (PT). Flowsheets (Taken 10/29/2023 1356) LTG: Pt will perform bed mobility with assistance level of: Supervision/Verbal cueing   Problem: RH Bed to Chair Transfers Goal: LTG Patient will perform bed/chair transfers w/assist (PT) Description: LTG: Patient will perform bed to chair transfers with assistance (PT). Flowsheets (Taken 10/29/2023 1356) LTG: Pt will perform Bed to Chair Transfers with assistance level: Supervision/Verbal cueing   Problem: RH Car Transfers Goal: LTG Patient will perform car transfers with assist (PT) Description: LTG: Patient will perform car transfers with assistance (PT). Flowsheets (Taken 10/29/2023 1356) LTG: Pt will perform car transfers with assist:: Contact Guard/Touching assist   Problem: RH Ambulation Goal: LTG Patient will ambulate in controlled environment (PT) Description: LTG: Patient will ambulate in a controlled environment, # of feet with assistance (PT). Flowsheets (Taken 10/29/2023 1356) LTG: Pt will ambulate in controlled environ  assist needed:: Supervision/Verbal cueing LTG: Ambulation distance in controlled  environment: 127ft Goal: LTG Patient will ambulate in home environment (PT) Description: LTG: Patient will ambulate in home environment, # of feet with assistance (PT). Flowsheets (Taken 10/29/2023 1356) LTG: Pt will ambulate in home environ  assist needed:: Supervision/Verbal cueing LTG: Ambulation distance in home environment: 40ft   Problem: RH Stairs Goal: LTG Patient will ambulate up and down stairs w/assist (PT) Description: LTG: Patient will ambulate up and down # of stairs with assistance (PT) Flowsheets (Taken 10/29/2023 1356) LTG: Pt will ambulate up/down stairs assist needed:: Contact Guard/Touching assist LTG: Pt will  ambulate up and down number of stairs: at least 4 with 2 hand rails

## 2023-10-29 NOTE — Progress Notes (Signed)
 Inpatient Rehabilitation  Patient information reviewed and entered into eRehab system by [facility]. Karen Kays., CCC/SLP, PPS Coordinator.  Information including medical coding, functional ability and quality indicators will be reviewed and updated through discharge.

## 2023-10-29 NOTE — Evaluation (Signed)
 Physical Therapy Assessment and Plan  Patient Details  Name: Jordan Collier MRN: [id number] Date of Birth: [birth year]  PT Diagnosis: Abnormal posture, Abnormality of gait, Difficulty walking, Hemiplegia dominant, Impaired sensation, and Muscle weakness Rehab Potential: Good ELOS: 2 weeks   Today's Date: 10/29/2023 PT Individual Time: 1300-1411 PT Individual Time Calculation (min): 71 min    Hospital Problem: Principal Problem:   Thalamic stroke [facility])   Past Medical History:  Past Medical History:  Diagnosis Date   Allergy to mold    Hypercholesterolemia    Psoriasis    Trigger finger, right middle finger    Past Surgical History:  Past Surgical History:  Procedure Laterality Date   TRIGGER FINGER RELEASE Right 01/2023   TUBAL LIGATION N/A 1980    Assessment & Plan Clinical Impression: Patient is a 78 year old LH- female with history of hypercholesterolemia, HSV, psoriasis who was [facility] to [facility] on 10/24/23 with acute onset of RLE weakness when she tried to get out of the car and activated EMS. CTA head/neck was negative for LVO and showed incident left thyroid  nodule 2.4 X 2.0 cm. 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 Thyroid  ultrasound done for follow up and revealed 3 cm left nodule meeting criteria for FNA and one meeting criteria for yearly surveillance. 2D  echo showed EF 65-70% with no wall abnormality, grade II diastolic dysfunction, mild to moderate MVR and mild to moderate aortic valve sclerosis. Dr. Jerri felt that stroke was due to small vessel disease and recommended DAPT X 3 weeks followed by ASA alone. Patient with right sided weakness affecting mobility and ADLs.  She requires mod assist currently and was independent PTA. CIR recommended due to functional decline.    Patient transferred to CIR on 10/28/2023 .   Patient currently requires mod with mobility  secondary to muscle weakness and muscle joint tightness, decreased cardiorespiratoy endurance, unbalanced muscle activation, and decreased standing balance, decreased postural control, hemiplegia, and decreased balance strategies.  Prior to hospitalization, patient was independent  with mobility and lived with Alone in a Independent living facility home.  Home access is  Level entry.  Patient will benefit from skilled PT intervention to maximize safe functional mobility, minimize fall risk, and decrease caregiver burden for planned discharge home with 24 hour supervision.  Anticipate patient will benefit from follow up HH at discharge.  PT - End of Session Activity Tolerance: Tolerates < 10 min activity, no significant change in vital signs Endurance Deficit: Yes Endurance Deficit Description: rest breaks needed PT Assessment Rehab Potential (ACUTE/IP ONLY): Good PT Barriers to Discharge: Lack of/limited family support;Decreased caregiver support;Insurance for SNF coverage PT Patient demonstrates impairments in the following area(s): Balance;Endurance;Motor;Safety;Sensory;Skin Integrity PT Transfers Functional Problem(s): Bed Mobility;Bed to Chair;Car PT Locomotion Functional Problem(s): Ambulation;Stairs PT Plan PT Intensity: Minimum of 1-2 x/day ,45 to 90 minutes PT Frequency: 5 out of 7 days PT Duration Estimated Length of Stay: 2 weeks PT Treatment/Interventions: Wheelchair propulsion/positioning;Stair training;Therapeutic Exercise;UE/LE Coordination activities;Skin care/wound management;Patient/family education;Neuromuscular re-education;Functional electrical stimulation;Balance/vestibular training;Community reintegration;Disease management/prevention;Ambulation/gait training;Cognitive remediation/compensation;Discharge planning;DME/adaptive equipment instruction;Functional mobility training;Psychosocial support;Splinting/orthotics;Pain management;UE/LE Strength taining/ROM;Therapeutic  Activities;Visual/perceptual remediation/compensation PT Transfers Anticipated Outcome(s): supervision PT Locomotion Anticipated Outcome(s): supervision PT Recommendation Recommendations for Other Services: Neuropsych consult Follow Up Recommendations: Home health PT;24 hour supervision/assistance Patient destination: Home Equipment Recommended: To be determined   PT Evaluation Precautions/Restrictions Precautions Precautions: Fall Precaution Comments: Rt hemiparesis Restrictions Weight Bearing Restrictions Per Provider  Order: No Pain Pain Assessment Pain Scale: 0-10 Pain Score: 0-No pain Pain Interference Pain Interference Pain Effect on Sleep: 1. Rarely or not at all Pain Interference with Therapy Activities: 1. Rarely or not at all Pain Interference with Day-to-Day Activities: 2. Occasionally Home Living/Prior Functioning Home Living Available Help at Discharge: Other (Comment) (Lives in a [facility] and can get additional care if needed) Type of Home: Independent living facility Home Access: Level entry Home Layout: One level Bathroom Shower/Tub: Walk-in shower;Door Bathroom Toilet: Handicapped height Bathroom Accessibility: Yes Additional Comments: Doesn't have any DME due to independence PTA.  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 Vision/Perception  Vision - History Ability to See in Adequate Light: 0 Adequate Perception Perception: Impaired Preception Impairment Details: Inattention/Neglect Perception-Other Comments: mild R inattention Praxis Praxis: Impaired Praxis Impairment Details: Motor planning;Organization  Cognition Overall Cognitive Status: Within Functional Limits for tasks assessed Arousal/Alertness:  Awake/alert Orientation Level: Oriented X4 Year: 2025 Month: January Day of Week: Correct Attention: Sustained;Focused;Selective Focused Attention: Appears intact Sustained Attention: Appears intact Selective Attention: Appears intact Memory: Appears intact Awareness: Appears intact Problem Solving: Appears intact Executive Function: Sequencing;Initiating;Decision Making Sequencing: Appears intact Decision Making: Appears intact Initiating: Appears intact Safety/Judgment: Appears intact Sensation Sensation Light Touch: Impaired by gross assessment Hot/Cold: Appears Intact Proprioception: Impaired by gross assessment Stereognosis: Appears Intact Additional Comments: diminished sensation to R fingers and R toes Coordination Gross Motor Movements are Fluid and Coordinated: No Coordination and Movement Description: R hemi Motor  Motor Motor: Hemiplegia Motor - Skilled Clinical Observations: R hemiparesis   Trunk/Postural Assessment  Cervical Assessment Cervical Assessment: Within Functional Limits Thoracic Assessment Thoracic Assessment: Exceptions to [facility] (rounded shoulders) Lumbar Assessment Lumbar Assessment: Within Functional Limits Postural Control Postural Control: Deficits on evaluation Righting Reactions: delayed on R  Balance Balance Balance Assessed: Yes Standardized Balance Assessment Standardized Balance Assessment: Berg Balance Test Berg Balance Test Sit to Stand: Needs minimal aid to stand or to stabilize Standing Unsupported: Able to stand 30 seconds unsupported Sitting with Back Unsupported but Feet Supported on Floor or Stool: Able to sit safely and securely 2 minutes Stand to Sit: Needs assistance to sit Transfers: Needs one person to assist Standing Unsupported with Eyes Closed: Needs help to keep from falling Standing Ubsupported with Feet Together: Needs help to attain position and unable to hold for 15 seconds From Standing, Reach Forward with  Outstretched Arm: Loses balance while trying/requires external support From Standing Position, Pick up Object from Floor: Unable to try/needs assist to keep balance From Standing Position, Turn to Look Behind Over each Shoulder: Needs assist to keep from losing balance and falling Turn 360 Degrees: Needs assistance while turning Standing Unsupported, Alternately Place Feet on Step/Stool: Needs assistance to keep from falling or unable to try Standing Unsupported, One Foot in Front: Loses balance while stepping or standing Standing on One Leg: Unable to try or needs assist to prevent fall Total Score: 8 Static Sitting Balance Static Sitting - Balance Support: Feet supported;No upper extremity supported Static Sitting - Level of Assistance: 7: Independent Dynamic Sitting Balance Dynamic Sitting - Balance Support: Feet supported;No upper extremity supported Dynamic Sitting - Level of Assistance: 5: Stand by assistance Static Standing Balance Static Standing - Balance Support: Right upper extremity supported;During functional activity Static Standing - Level of Assistance: 4: Min  assist Dynamic Standing Balance Dynamic Standing - Balance Support: Right upper extremity supported;During functional activity Dynamic Standing - Level of Assistance: 3: Mod assist Extremity Assessment      RLE Assessment RLE Assessment: Exceptions to [facility] General Strength Comments: Grossly 4-/5 LLE Assessment LLE Assessment: Within Functional Limits  Care Tool Care Tool Bed Mobility Roll left and right activity   Roll left and right assist level: Minimal Assistance - Patient > 75%    Sit to lying activity   Sit to lying assist level: Minimal Assistance - Patient > 75%    Lying to sitting on side of bed activity   Lying to sitting on side of bed assist level: the ability to move from lying on the back to sitting on the side of the bed with no back support.: Contact Guard/Touching assist     Care Tool  Transfers Sit to stand transfer   Sit to stand assist level: Minimal Assistance - Patient > 75%    Chair/bed transfer   Chair/bed transfer assist level: Minimal Assistance - Patient > 75%    Car transfer   Car transfer assist level: Minimal Assistance - Patient > 75%      Care Tool Locomotion Ambulation   Assist level: Moderate Assistance - Patient 50 - 74% Assistive device: No Device Max distance: 32ft  Walk 10 feet activity   Assist level: Moderate Assistance - Patient - 50 - 74% Assistive device: No Device   Walk 50 feet with 2 turns activity Walk 50 feet with 2 turns activity did not occur: Safety/medical concerns (RLE weakness)      Walk 150 feet activity Walk 150 feet activity did not occur: Safety/medical concerns      Walk 10 feet on uneven surfaces activity Walk 10 feet on uneven surfaces activity did not occur: Safety/medical concerns      Stairs   Assist level: Minimal Assistance - Patient > 75% Stairs assistive device: 2 hand rails Max number of stairs: 4  Walk up/down 1 step activity   Walk up/down 1 step (curb) assist level: Minimal Assistance - Patient > 75% Walk up/down 1 step or curb assistive device: 2 hand rails  Walk up/down 4 steps activity   Walk up/down 4 steps assist level: Minimal Assistance - Patient > 75% Walk up/down 4 steps assistive device: 2 hand rails  Walk up/down 12 steps activity Walk up/down 12 steps activity did not occur: Safety/medical concerns      Pick up small objects from floor Pick up small object from the floor (from standing position) activity did not occur: Safety/medical concerns      Wheelchair Is the patient using a wheelchair?: Yes Type of Wheelchair: Manual   Wheelchair assist level: Total Assistance - Patient < 25% Max wheelchair distance: 35ft  Wheel 50 feet with 2 turns activity   Assist Level: Total Assistance - Patient < 25%  Wheel 150 feet activity   Assist Level: Total Assistance - Patient < 25%     Refer to Care Plan for Long Term Goals  SHORT TERM GOAL WEEK 1 PT Short Term Goal 1 (Week 1): Pt will complete bed mobility with CGA PT Short Term Goal 2 (Week 1): Pt will complete bed<>chair transfers with minA and LRAD PT Short Term Goal 3 (Week 1): Pt will ambulate at least 161ft with minA and LRAD PT Short Term Goal 4 (Week 1): Pt will improve BERG balance score by at least 8 points  Recommendations for other services: Neuropsych  Skilled Therapeutic Intervention Mobility Bed Mobility Bed Mobility: Supine to Sit;Sit to Supine Right Sidelying to Sit: Minimal Assistance - Patient > 75% Supine to Sit: Minimal Assistance - Patient > 75% Sitting - Scoot to Edge of Bed: Contact Guard/Touching assist Sit to Supine: Minimal Assistance - Patient > 75% Transfers Transfers: Sit to Stand;Stand Pivot Transfers;Stand to Sit Sit to Stand: Minimal Assistance - Patient > 75% Stand to Sit: Minimal Assistance - Patient > 75% Stand Pivot Transfers: Minimal Assistance - Patient > 75% Stand Pivot Transfer Details: Verbal cues for sequencing;Verbal cues for safe use of DME/AE;Verbal cues for technique;Verbal cues for gait pattern;Verbal cues for precautions/safety;Tactile cues for initiation Transfer (Assistive device): None Locomotion  Gait Ambulation: Yes Gait Assistance: Moderate Assistance - Patient 50-74% Gait Distance (Feet): 35 Feet Assistive device: None Gait Assistance Details: Tactile cues for initiation;Tactile cues for weight shifting;Tactile cues for posture;Verbal cues for gait pattern;Verbal cues for technique;Verbal cues for precautions/safety;Verbal cues for safe use of DME/AE;Verbal cues for sequencing Gait Gait: Yes Gait Pattern: Impaired Gait Pattern: Step-to pattern;Decreased step length - left;Decreased step length - right;Decreased stride length;Decreased dorsiflexion - right;Decreased weight shift to left;Trunk flexed;Poor foot clearance - right;Narrow base of  support Stairs / Additional Locomotion Stairs: Yes Stairs Assistance: Minimal Assistance - Patient > 75% Stair Management Technique: Two rails;Step to pattern;Forwards Number of Stairs: 4 Height of Stairs: 6 Wheelchair Mobility Wheelchair Mobility: Yes Wheelchair Assistance: Total Assistance - Patient <25% Wheelchair Propulsion: Both upper extremities Wheelchair Parts Management: Needs assistance Distance: 3ft  Skilled Treatment: Pt sitting in recliner on arrival - agreeable to PT evaluation. Has no c/o pain. Pt pleasant and cooperative during assessment - oriented x4 and aware of her impairments. Retrieved a manual wheelchair from DME closet. Functional mobility completed as outlined above. Overall, she requires minA for transfers using a RW, modA for gait with no AD, and minA for stair navigation. She presents with R sided weakness, mild R sided inattention, R trunk lean in standing, and impaired standing balance. Session concluded with her sitting in recliner with seat belt alarm on, all needs met.  Instructed pt in results of PT evaluation as detailed above, PT POC, rehab potential, rehab goals, and discharge recommendations. Additionally discussed CIR's policies regarding fall safety and use of chair alarm and/or quick release belt. Pt verbalized understanding and in agreement. Will update pt's family members as they become available.   Discharge Criteria: Patient will be discharged from PT if patient refuses treatment 3 consecutive times without medical reason, if treatment goals not met, if there is a change in medical status, if patient makes no progress towards goals or if patient is discharged from hospital.  The above assessment, treatment plan, treatment alternatives and goals were discussed and mutually agreed upon: by patient  Sherlean SHAUNNA Perks  PT, DPT, CSRS  10/29/2023, 1:57 PM

## 2023-10-29 NOTE — Evaluation (Signed)
 Occupational Therapy Assessment and Plan  Patient Details  Name: Jordan Collier MRN: [id number] Date of Birth: 06-30-1946  OT Diagnosis: acute pain, hemiplegia affecting non-dominant side, and muscle weakness (generalized) Rehab Potential: Rehab Potential (ACUTE ONLY): Good ELOS: 12-14 days   Today's Date: 10/29/2023 OT Individual Time: 9194-9084 OT Individual Time Calculation (min): 70 min     Hospital Problem: Principal Problem:   Thalamic stroke (HCC)   Past Medical History:  Past Medical History:  Diagnosis Date   Allergy to mold    Hypercholesterolemia    Psoriasis    Trigger finger, right middle finger    Past Surgical History:  Past Surgical History:  Procedure Laterality Date   TRIGGER FINGER RELEASE Right 01/2023   TUBAL LIGATION N/A 1980    Assessment & Plan Clinical Impression:  Jordan Collier is a 78 year old LH- female with history of hypercholesterolemia, HSV, psoriasis who was admitted to [facility] on 10/24/23 with acute onset of RLE weakness when she tried to get out of the car and activated EMS. CTA head/neck was negative for LVO and showed incident left thyroid  nodule 2.4 X 2.0 cm. 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 Thyroid  ultrasound done for follow up and revealed 3 cm left nodule meeting criteria for FNA and one meeting criteria for yearly surveillance. 2D echo showed EF 65-70% with no wall abnormality, grade II diastolic dysfunction, mild to moderate MVR and mild to moderate aortic valve sclerosis. Dr. Jerri felt that stroke was due to small vessel disease and recommended DAPT X 3 weeks followed by ASA alone. Patient with right sided weakness affecting mobility and ADLs. She requires mod assist currently and was independent PTA. CIR recommended due to functional decline. Patient transferred to CIR on 10/28/2023 .    Patient currently requires mod with basic  self-care skills secondary to muscle weakness, decreased cardiorespiratoy endurance, decreased coordination and decreased motor planning, decreased Lt peripheral vision, and decreased sitting balance, decreased standing balance, and hemiplegia.  Prior to hospitalization, patient could complete self-care independently.  Patient will benefit from skilled intervention to increase independence with basic self-care skills and increase level of independence with iADL prior to discharge home independently.  Anticipate patient will require follow up outpatient.  OT - End of Session Activity Tolerance: Tolerates 10 - 20 min activity with multiple rests Endurance Deficit: Yes Endurance Deficit Description: rest breaks needed OT Assessment Rehab Potential (ACUTE ONLY): Good OT Barriers to Discharge: Lack of/limited family support OT Patient demonstrates impairments in the following area(s): Balance;Endurance;Motor;Vision OT Basic ADL's Functional Problem(s): Eating;Grooming;Bathing;Dressing;Toileting OT Advanced ADL's Functional Problem(s): Simple Meal Preparation OT Transfers Functional Problem(s): Toilet;Tub/Shower OT Additional Impairment(s): Fuctional Use of Upper Extremity OT Plan OT Intensity: Minimum of 1-2 x/day, 45 to 90 minutes OT Frequency: 5 out of 7 days OT Duration/Estimated Length of Stay: 12-14 days OT Treatment/Interventions: Balance/vestibular training;Discharge planning;Functional electrical stimulation;Pain management;Self Care/advanced ADL retraining;Therapeutic Activities;UE/LE Coordination activities;Disease mangement/prevention;Functional mobility training;Patient/family education;Therapeutic Exercise;Visual/perceptual remediation/compensation;Community reintegration;DME/adaptive equipment instruction;Neuromuscular re-education;UE/LE Strength taining/ROM OT Self Feeding Anticipated Outcome(s): Mod I OT Basic Self-Care Anticipated Outcome(s): Mod I OT Toileting Anticipated  Outcome(s): Mod I OT Bathroom Transfers Anticipated Outcome(s): Mod I OT Recommendation Recommendations for Other Services: Therapeutic Recreation consult Therapeutic Recreation Interventions: Pet therapy;Outing/community reintergration;Stress management Patient destination: Home Follow Up Recommendations: Outpatient OT Equipment Recommended: To be determined   OT Evaluation Precautions/Restrictions  Precautions Precautions: Fall Precaution Comments: Rt hemiparesis Restrictions Weight Bearing Restrictions  Per Provider Order: No Home Living/Prior Functioning Home Living Family/patient expects to be discharged to:: Assisted living Living Arrangements: Alone Available Help at Discharge: Other (Comment) (Lives at ILF but could arrange hired assist with [employer]) Type of Home: Apartment Home Access: Level entry (has slight incline to get to door of apartment) Home Layout: One level Bathroom Shower/Tub: Psychologist, counselling, Door Bathroom Toilet: Handicapped height Bathroom Accessibility: Yes Additional Comments: Doesn't have any DME due to independence PTA.  Lives With: Alone IADL History Homemaking Responsibilities: Yes Meal Prep Responsibility: No Homemaking Comments: Pt would attend the in facility bistro for meals Current License: Yes Occupation: Full time employment Type of Occupation: Works at [employer] and Hobbies: Attending concerts Prior Function Level of Independence: Independent with basic ADLs, Independent with gait, Independent with homemaking with ambulation  Able to Take Stairs?: Yes Driving: Yes Vision Baseline Vision/History: 1 Wears glasses (progressive lenses) Ability to See in Adequate Light: 0 Adequate Patient Visual Report: No change from baseline Vision Assessment?: Yes Eye Alignment: Within Functional Limits Ocular Range of Motion: Within Functional Limits Alignment/Gaze Preference: Within Defined Limits Tracking/Visual Pursuits: Decreased  smoothness of horizontal tracking Saccades: Within functional limits Convergence: Impaired (comment) (does not converge) Visual Fields: Impaired-to be further tested in functional context Additional Comments: 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 Perception  Perception: Impaired Perception-Other Comments: RUE Praxis Praxis: Impaired Praxis Impairment Details: Motor planning Cognition Cognition Overall Cognitive Status: Within Functional Limits for tasks assessed Arousal/Alertness: Awake/alert Orientation Level: Person;Place;Situation Person: Oriented Place: Oriented Situation: Oriented Memory: Appears intact Awareness: Appears intact Problem Solving: Appears intact Safety/Judgment: Appears intact Brief Interview for Mental Status (BIMS) Repetition of Three Words (First Attempt): 3 Temporal Orientation: Year: Correct Temporal Orientation: Month: Accurate within 5 days Temporal Orientation: Day: Incorrect Recall: Sock: Yes, no cue required Recall: Blue: Yes, no cue required Recall: Bed: Yes, no cue required BIMS Summary Score: 14 Sensation Sensation Light Touch: Impaired by gross assessment Hot/Cold: Appears Intact Proprioception: Appears Intact Stereognosis: Not tested Additional Comments: Formal light touch testing intact however per report Rt foot and fingers are numb Coordination Gross Motor Movements are Fluid and Coordinated: No Fine Motor Movements are Fluid and Coordinated: No Finger Nose Finger Test: RUE impaired but able to complete Motor  Motor Motor: Other (comment) Motor - Skilled Clinical Observations: R hemiparesis  Trunk/Postural Assessment  Cervical Assessment Cervical Assessment: Within Functional Limits Thoracic Assessment Thoracic Assessment: Exceptions to [facility] (thoracic rounding) Lumbar Assessment Lumbar Assessment: Within Functional Limits Postural Control Postural  Control: Deficits on evaluation Righting Reactions: delayed on R  Balance Balance Balance Assessed: Yes Static Sitting Balance Static Sitting - Balance Support: Feet supported;No upper extremity supported Static Sitting - Level of Assistance: 5: Stand by assistance (Supervision) Dynamic Sitting Balance Dynamic Sitting - Balance Support: Feet supported;No upper extremity supported Dynamic Sitting - Level of Assistance: 5: Stand by assistance (supervision) Static Standing Balance Static Standing - Balance Support: Right upper extremity supported;During functional activity Static Standing - Level of Assistance: 4: Min assist Dynamic Standing Balance Dynamic Standing - Balance Support: Right upper extremity supported;During functional activity Dynamic Standing - Level of Assistance: 3: Mod assist Extremity/Trunk Assessment RUE Assessment RUE Assessment: Exceptions to [facility] Active Range of Motion (AROM) Comments: Shoulder flexion limited to 120 degrees, WFL distally General Strength Comments: 3+/5 grossly LUE Assessment LUE Assessment: Within Functional Limits  Care Tool Care Tool Self Care Eating   Eating Assist Level: Set up assist  Oral Care    Oral Care Assist Level: Set up assist    Bathing   Body parts bathed by patient: Right arm;Left arm;Chest;Abdomen;Front perineal area;Right upper leg;Left upper leg;Face Body parts bathed by helper: Buttocks;Right lower leg;Left lower leg   Assist Level: Moderate Assistance - Patient 50 - 74%    Upper Body Dressing(including orthotics)   What is the patient wearing?: Pull over shirt   Assist Level: Supervision/Verbal cueing    Lower Body Dressing (excluding footwear)   What is the patient wearing?: Pants Assist for lower body dressing: Minimal Assistance - Patient > 75%    Putting on/Taking off footwear   What is the patient wearing?: Non-skid slipper socks Assist for footwear: Moderate Assistance - Patient 50 - 74%        Care Tool Toileting Toileting activity   Assist for toileting: Moderate Assistance - Patient 50 - 74%     Care Tool Bed Mobility Roll left and right activity        Sit to lying activity        Lying to sitting on side of bed activity   Lying to sitting on side of bed assist level: the ability to move from lying on the back to sitting on the side of the bed with no back support.: Contact Guard/Touching assist     Care Tool Transfers Sit to stand transfer   Sit to stand assist level: Minimal Assistance - Patient > 75%    Chair/bed transfer         Toilet transfer   Assist Level: Moderate Assistance - Patient 50 - 74%     Care Tool Cognition  Expression of Ideas and Wants Expression of Ideas and Wants: 3. Some difficulty - exhibits some difficulty with expressing needs and ideas (e.g, some words or finishing thoughts) or speech is not clear  Understanding Verbal and Non-Verbal Content Understanding Verbal and Non-Verbal Content: 4. Understands (complex and basic) - clear comprehension without cues or repetitions   Memory/Recall Ability Memory/Recall Ability : Current season;That he or she is in a hospital/hospital unit   Refer to Care Plan for Long Term Goals  SHORT TERM GOAL WEEK 1 OT Short Term Goal 1 (Week 1): Pt will complete sit > stand in prep for ADL with CGA using LRAD OT Short Term Goal 2 (Week 1): Pt will complete 2/3 toileting steps with min A for dynamic balance OT Short Term Goal 3 (Week 1): Pt will complete toilet transfer with CGA using LRAD  Recommendations for other services: Therapeutic Recreation  Pet therapy, Stress management, and Outing/community reintegration   Skilled Therapeutic Intervention Patient received upright in bed upon therapy arrival and agreeable to participate in OT evaluation. Education provided on OT purpose, therapy schedule, goals for therapy, and safety policy while in rehab. No pain reported.  Patient demonstrates Rt hemiparesis  with delayed/impaired coordination on RUE and RLE, dynamic balance and functional endurance deficits resulting in difficulty completing BADL tasks without increased physical assist. Cues needed throughout for body positioning, sequencing and safe use of RW. Pt will benefit from skilled OT services to focus on mentioned deficits. See below for ADL and functional transfer performance. ADLs completed at EOB. Did have pt stand with Rt HHA with mod A needed from low seat and Rt knee block however no buckling, and also tried with RW and heavy min A but decent control of RLE. Did need max cues for technique with RW for mod A stand pivot to recliner  especially with stepping back. Pt remained seated in recliner at conclusion of session with belt alarm on and all needs met at end of session.    ADL ADL Eating: Set up Where Assessed-Eating: Chair Grooming: Setup Where Assessed-Grooming: Chair Upper Body Bathing: Supervision/safety Where Assessed-Upper Body Bathing: Edge of bed Lower Body Bathing: Moderate assistance Where Assessed-Lower Body Bathing: Edge of bed Upper Body Dressing: Supervision/safety Where Assessed-Upper Body Dressing: Edge of bed Lower Body Dressing: Moderate assistance Where Assessed-Lower Body Dressing: Edge of bed Toileting: Moderate assistance Where Assessed-Toileting: Bedside Commode Toilet Transfer: Moderate assistance Toilet Transfer Method: Stand pivot Toilet Transfer Equipment: Bedside commode;Other (comment) (RW) Tub/Shower Transfer: Unable to assess Tub/Shower Transfer Method: Unable to assess Film/video Editor: Unable to assess [employer] Method: Unable to assess Mobility  Bed Mobility Bed Mobility: Right Sidelying to Sit;Sitting - Scoot to Edge of Bed Right Sidelying to Sit: Contact Guard/Touching assist Sitting - Scoot to Edge of Bed: Contact Guard/Touching assist Transfers Sit to Stand: Minimal Assistance - Patient > 75%   Discharge  Criteria: Patient will be discharged from OT if patient refuses treatment 3 consecutive times without medical reason, if treatment goals not met, if there is a change in medical status, if patient makes no progress towards goals or if patient is discharged from hospital.  The above assessment, treatment plan, treatment alternatives and goals were discussed and mutually agreed upon: by patient  Jordan FORBES Fritter, MS, OTR/L  10/29/2023, 11:32 AM

## 2023-10-30 DIAGNOSIS — I6381 Other cerebral infarction due to occlusion or stenosis of small artery: Secondary | ICD-10-CM | POA: Diagnosis not present

## 2023-10-30 LAB — BASIC METABOLIC PANEL
Anion gap: 7 (ref 5–15)
BUN: 11 mg/dL (ref 8–23)
CO2: 24 mmol/L (ref 22–32)
Calcium: 9 mg/dL (ref 8.9–10.3)
Chloride: 103 mmol/L (ref 98–111)
Creatinine, Ser: 0.74 mg/dL (ref 0.44–1.00)
GFR, Estimated: >60 mL/min (ref >60)
Glucose, Bld: 102 mg/dL — ABNORMAL HIGH (ref 70–99)
Potassium: 4.5 mmol/L (ref 3.5–5.1)
Sodium: 134 mmol/L — ABNORMAL LOW (ref 135–145)

## 2023-10-30 LAB — CBC
HCT: 39.7 % (ref 36.0–46.0)
Hemoglobin: 13.1 g/dL (ref 12.0–15.0)
MCH: 27.6 pg (ref 26.0–34.0)
MCHC: 33 g/dL (ref 30.0–36.0)
MCV: 83.8 fL (ref 80.0–100.0)
Platelets: 340 10*3/uL (ref 150–400)
RBC: 4.74 MIL/uL (ref 3.87–5.11)
RDW: 14.4 % (ref 11.5–15.5)
WBC: 9.8 10*3/uL (ref 4.0–10.5)
nRBC: 0 % (ref 0.0–0.2)

## 2023-10-30 MED ORDER — ENSURE ENLIVE PO LIQD
237.0000 mL | ORAL | Status: DC
  Administered 2023-11-01 – 2023-11-09 (×6): 237 mL via ORAL

## 2023-10-30 MED ORDER — VITAMIN D 25 MCG (1000 UNIT) PO TABS
1000.0000 [IU] | ORAL_TABLET | Freq: Every day | ORAL | Status: DC
  Administered 2023-10-30 – 2023-10-31 (×2): 1000 [IU] via ORAL
  Filled 2023-10-30 (×2): qty 1

## 2023-10-30 NOTE — Progress Notes (Signed)
 Physical Therapy Session Note  Patient Details  Name: Jordan Collier MRN: [id number] Date of Birth: 11-Jan-1946  Today's Date: 10/30/2023 PT Individual Time: 1000-1114 + 1415-1526 PT Individual Time Calculation (min): 74 min  + 71 min  Short Term Goals: Week 1:  PT Short Term Goal 1 (Week 1): Pt will complete bed mobility with CGA PT Short Term Goal 2 (Week 1): Pt will complete bed<>chair transfers with minA and LRAD PT Short Term Goal 3 (Week 1): Pt will ambulate at least 138ft with minA and LRAD PT Short Term Goal 4 (Week 1): Pt will improve BERG balance score by at least 8 points  Skilled Therapeutic Interventions/Progress Updates:      1st session: Pt sitting in wheelchair and has no c/o pain. Transported at wheelchair level to main rehab gym. Assisted to mat table with stand pivot minA transfer.   Active warm up for therapy included 1x10 repeated sit<>Stands at minA level with emphasis on controlled lowering and equal weight shifting to reduce compensation. Also added trunk twists in standing, lateral trunk flexion in standing, and pre-gait marching. All completed with BUE support CGA for the twists/lateral flex and minA for pre-gait marching. Pt required mirror for visual feedback while pre-gait marching, frequent trunk lean and LOB to the R - decreased coordination and control for RLE.   Gait training with RW 167ft with minA - primarily for trunk support and stability due to persistent R trunk lean. Intermittent step-to vs reciprocal stepping. Narrow BOS and RLE adducting with swing phase.   Mat table NMR for RLE strengthening: -1 minute of bridges  -2x10 R knee to chest -2x10 R knee in table-top position with isometric holds x5 seconds -2x10 knee fall outs (windshield wipers) -1x10 clam shells on R -1x10 clam shells on R with yell TB resistance -2x10 straight hip abduction in sidelying -2x10 prone hamstring curls with yellow TB Resistance   Returned upright to sitting with  supervision. Discussed CIR resources - CVA support group, pet therapy, and TR therapy with possible outing. Pt excited abuot all resources, plans to contact her ILF to see if they can provide transportation to CVA support group 1x/month.   Pt returned to her room - left sitting upright with seat belt alarm on and all needs met.    2nd session: Pt sitting EOB with a friend visiting who leaves upon PT entry. Pt has no c/o pain. Stand pivot transfer completed at minA level into wheelchair. Transported to day room rehab gym for time management.   Setup at [facility] over treadmill - harness donned/doffed while sitting in wheelchair for safety. MinA for stepping on the treadmill forward facing and off the treadmill backwards. Partial BWS provided through [employer]ite[employer]ait harness.   Completed x3 trials of gait training on treadmill: 1) 6 minutes, 0.3-0.5 mph, 216' 2) 3:40, 0.5 mph, 156' 3) 5 minutes, 0.5 mph, 227'  PT providing min guard and TC for lateral weight shifting, upright posture. VC for increasing R heel strike, widening BOS on R, and progressing from step to vs reciprocal stepping.   Pt assisted onto Nustep with minA stand pivot transfer. She completed 12 minutes with BLE only to isolate for strengthening. Able to complete ~40-50 spm cadence and a total of 525 steps.   Pt returned to her room - requesting to lie down 2/2 fatigue. MinA for stand pivot transfer and completes bed mobility at supervision level. Left in bed with all needs met.   Therapy Documentation Precautions:  Precautions Precautions: Fall  Precaution Comments: Rt hemiparesis Restrictions Weight Bearing Restrictions Per Provider Order: No General:     Therapy/Group: Individual Therapy  Jordan Collier  PT, DPT, CSRS  10/30/2023, 7:49 AM

## 2023-10-30 NOTE — Progress Notes (Signed)
 PROGRESS NOTE   Subjective/Complaints: No new complaints this morning Discussed labs are stable today- d/ced labs and CBGs She is very appreciative of care here  ROS: +feels cold at 3am, +right sided weakness   Objective:   No results found. Recent Labs    10/29/23 0514 10/30/23 0658  WBC 9.3 9.8  HGB 12.6 13.1  HCT 37.8 39.7  PLT 318 340   Recent Labs    10/29/23 0514 10/30/23 0658  NA 134* 134*  K 4.6 4.5  CL 105 103  CO2 23 24  GLUCOSE 104* 102*  BUN 16 11  CREATININE 0.92 0.74  CALCIUM 8.8* 9.0    Intake/Output Summary (Last 24 hours) at 10/30/2023 1151 Last data filed at 10/30/2023 0826 Gross per 24 hour  Intake 956 ml  Output --  Net 956 ml        Physical Exam: Vital Signs Blood pressure 111/60, pulse 71, temperature (!) 97.4 F (36.3 C), temperature source Oral, resp. rate 17, height 5' 8 (1.727 m), weight 67.7 kg, SpO2 96%. Gen: no distress, normal appearing HEENT: oral mucosa pink and moist, NCAT Cardio: Reg rate Chest: normal effort, normal rate of breathing Abd: soft, non-distended Ext: no edema Psych: pleasant, normal affect Musculoskeletal:        General: No swelling or tenderness. Normal range of motion.     Cervical back: Normal range of motion.  Skin:    General: Skin is warm and dry.  Neurological:     Mental Status: She is alert.     Comments: Alert and oriented x 3. Normal insight and awareness. Intact Memory. Normal language with mild dysarthria. Cranial nerve exam unremarkable except for right central VII and right hemifacial sensory loss. MMT: RUE is 3 to 3+/5 prox to distal. RLE 3+/5 prox to 4/5 distal. LUE and LLE 4+ to 5/5. Sensory exam notable for 1/2 to LT and pain in right arm and leg. Decreased FMC of right side with PD. No frank ataxia.  No abnl resting tone. DTR's 1+. Stable 10/30/23    Psychiatric:        Mood and Affect: Mood normal.        Behavior: Behavior  normal.    Assessment/Plan: 1. Functional deficits which require 3+ hours per day of interdisciplinary therapy in a comprehensive inpatient rehab setting. Physiatrist is providing close team supervision and 24 hour management of active medical problems listed below. Physiatrist and rehab team continue to assess barriers to discharge/monitor patient progress toward functional and medical goals  Care Tool:  Bathing    Body parts bathed by patient: Right arm, Left arm, Chest, Abdomen, Front perineal area, Right upper leg, Left upper leg, Face, Right lower leg, Left lower leg, Buttocks   Body parts bathed by helper: Buttocks, Right lower leg, Left lower leg     Bathing assist Assist Level: Minimal Assistance - Patient > 75%     Upper Body Dressing/Undressing Upper body dressing   What is the patient wearing?: Pull over shirt    Upper body assist Assist Level: Supervision/Verbal cueing    Lower Body Dressing/Undressing Lower body dressing      What is the patient  wearing?: Underwear/pull up, Pants     Lower body assist Assist for lower body dressing: Minimal Assistance - Patient > 75%     Toileting Toileting    Toileting assist Assist for toileting: Minimal Assistance - Patient > 75%     Transfers Chair/bed transfer  Transfers assist     Chair/bed transfer assist level: Minimal Assistance - Patient > 75%     Locomotion Ambulation   Ambulation assist      Assist level: Moderate Assistance - Patient 50 - 74% Assistive device: No Device Max distance: 41ft   Walk 10 feet activity   Assist     Assist level: Moderate Assistance - Patient - 50 - 74% Assistive device: No Device   Walk 50 feet activity   Assist Walk 50 feet with 2 turns activity did not occur: Safety/medical concerns (RLE weakness)         Walk 150 feet activity   Assist Walk 150 feet activity did not occur: Safety/medical concerns         Walk 10 feet on uneven surface   activity   Assist Walk 10 feet on uneven surfaces activity did not occur: Safety/medical concerns         Wheelchair     Assist Is the patient using a wheelchair?: Yes Type of Wheelchair: Manual    Wheelchair assist level: Total Assistance - Patient < 25% Max wheelchair distance: 63ft    Wheelchair 50 feet with 2 turns activity    Assist        Assist Level: Total Assistance - Patient < 25%   Wheelchair 150 feet activity     Assist      Assist Level: Total Assistance - Patient < 25%   Blood pressure 111/60, pulse 71, temperature (!) 97.4 F (36.3 C), temperature source Oral, resp. rate 17, height 5' 8 (1.727 m), weight 67.7 kg, SpO2 96%.  Medical Problem List and Plan: 1. 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              -patient may  shower             -ELOS/Goals: 12-16 days, mod I goals             -she is having some waxing and waning sensory symptoms particularly in RLE. Discussed characteristics of these, particularly after thalamic stroke. Reassured her that it wasn't the sign of a progressive infarct  Team conference held 1/8  Grounds pass ordered 2.  Antithrombotics: -DVT/anticoagulation:  Pharmaceutical: Lovenox              -antiplatelet therapy: plavix  and 81mg  asa for 3 weeks then asa alone.  3. Pain Management: Tylenol  prn. 4. Mood/Behavior/Sleep: LCSW to follow for evaluation and support.             --melatonin prn for insomnia.              -antipsychotic agents: N/A             -pt periodically experiencing depressed mood. Feels that she can work through it. Encouraged her to reach out if things should change. 5. Neuropsych/cognition: This patient is capable of making decisions on her own behalf. 6. Skin/Wound Care: Routine pressure relief measures.  7. Hyponatremia: stabilized, d/c labs  8. Blood pressure: Monitor TID--controlled at without meds  9.  Hypercholesterolemia: LDL-196. Continue Zetia  and Leqvio pending approval.   10. Hyperglycemia: discussed that CBGs are low, can  d/c CBG checks  11. Low protein: educated patient regarding levels   12. Vitamin D  insufficiency: D3 ordered   LOS: 2 days A FACE TO FACE EVALUATION WAS PERFORMED  Jordan Collier P Jordan Collier 10/30/2023, 11:51 AM

## 2023-10-30 NOTE — Discharge Instructions (Addendum)
 Inpatient Rehab Discharge Instructions  Vaneta Hammontree Discharge date and time:  11/12/23  Activities/Precautions/ Functional Status: Activity: activity as tolerated Diet: cardiac diet Wound Care: none needed   Functional status:  ___ No restrictions     ___ Walk up steps independently ___ 24/7 supervision/assistance   ___ Walk up steps with assistance _X__ Intermittent supervision/assistance  ___ Bathe/dress independently _X__ Walk with walker    ___ Bathe/dress with assistance ___ Walk Independently    ___ Shower independently ___ Walk with supervision     _X__ Shower with assistance _X__ No alcohol      ___ Return to work/school ________   Special Instructions:   STROKE/TIA DISCHARGE INSTRUCTIONS SMOKING Cigarette smoking nearly doubles your risk of having a stroke & is the single most alterable risk factor  If you smoke or have smoked in the last 12 months, you are advised to quit smoking for your health. Most of the excess cardiovascular risk related to smoking disappears within a year of stopping. Ask you doctor about anti-smoking medications [place] Quit Line: 1-800-QUIT NOW Free Smoking Cessation Classes [phone]  CHOLESTEROL Know your levels; limit fat & cholesterol in your diet  Lipid Panel     Component Value Date/Time   CHOL 291 (H) 10/25/2023 0452   TRIG 68 10/25/2023 0452   HDL 81 10/25/2023 0452   CHOLHDL 3.6 10/25/2023 0452   VLDL 14 10/25/2023 0452   LDLCALC 196 (H) 10/25/2023 0452     Many patients benefit from treatment even if their cholesterol is at goal. Goal: Total Cholesterol (CHOL) less than 160 Goal:  Triglycerides (TRIG) less than 150 Goal:  HDL greater than 40 Goal:  LDL (LDLCALC) less than 100   BLOOD PRESSURE American Stroke Association blood pressure target is less that 120/80 mm/Hg  Your discharge blood pressure is:  BP: 111/60 Monitor your blood pressure Limit your salt and alcohol  intake Many individuals will require more than one  medication for high blood pressure  DIABETES (A1c is a blood sugar average for last 3 months) Goal HGBA1c is under 7% (HBGA1c is blood sugar average for last 3 months)  Diabetes: No known diagnosis of diabetes    Lab Results  Component Value Date   HGBA1C 5.8 (H) 10/24/2023    Your HGBA1c can be lowered with medications, healthy diet, and exercise. Check your blood sugar as directed by your physician Call your physician if you experience unexplained or low blood sugars.  PHYSICAL ACTIVITY/REHABILITATION Goal is 30 minutes at least 4 days per week  Activity: No driving, Therapies: see above Return to work: To be decided on follow up Activity decreases your risk of heart attack and stroke and makes your heart stronger.  It helps control your weight and blood pressure; helps you relax and can improve your mood. Participate in a regular exercise program. Talk with your doctor about the best form of exercise for you (dancing, walking, swimming, cycling).  DIET/WEIGHT Goal is to maintain a healthy weight  Your discharge diet is:  Diet Order             Diet Heart Room service appropriate? Yes; Fluid consistency: Thin  Diet effective now                   liquids Your height is:  Height: 5' 8 (172.7 cm) Your current weight is: Weight: 67.7 kg Your Body Mass Index (BMI) is:  BMI (Calculated): 22.7 Following the type of diet specifically designed for you will help  prevent another stroke. You are at goal weight    Your goal Body Mass Index (BMI) is 19-24. Healthy food habits can help reduce 3 risk factors for stroke:  High cholesterol, hypertension, and excess weight.  RESOURCES Stroke/Support Group:  Call [phone]   STROKE EDUCATION PROVIDED/REVIEWED AND GIVEN TO PATIENT Stroke warning signs and symptoms How to activate emergency medical system (call 911). Medications prescribed at discharge. Need for follow-up after discharge. Personal risk factors for stroke. Pneumonia  vaccine given:  Flu vaccine given:  My questions have been answered, the writing is legible, and I understand these instructions.  I will adhere to these goals & educational materials that have been provided to me after my discharge from the hospital.     My questions have been answered and I understand these instructions. I will adhere to these goals and the provided educational materials after my discharge from the hospital.  Patient/Caregiver Signature _______________________________ Date __________  Clinician Signature _______________________________________ Date __________  Please bring this form and your medication list with you to all your follow-up doctor's appointments.

## 2023-10-30 NOTE — Progress Notes (Signed)
 Occupational Therapy Session Note  Patient Details  Name: Jordan Collier MRN: [id number] Date of Birth: [birth year]  Today's Date: 10/30/2023 OT Individual Time: 9267-9169 OT Individual Time Calculation (min): 58 min    Short Term Goals: Week 1:  OT Short Term Goal 1 (Week 1): Pt will complete sit > stand in prep for ADL with CGA using LRAD OT Short Term Goal 2 (Week 1): Pt will complete 2/3 toileting steps with min A for dynamic balance OT Short Term Goal 3 (Week 1): Pt will complete toilet transfer with CGA using LRAD  Skilled Therapeutic Interventions/Progress Updates:    Patient receieved sitting up in bed waiting for therapy,  eager to get out of bed and participate.  Very motivated for improved independence - asking questions about rollator and grab bars in shower at ILF.  Cueing throughout session for patient to slow down.  Patient completed stand step transfer to chair with facilitation for forward weight shift over feet - has tendency to lean backward.  Indicated need to void - continent void on toilet.  Walked to shower seat for shower.  Needing min assist when standing for balance and to free hands for functioning.  Patient with R proximal weakness evidenced when attempting to reach to items at eye level.  Limited RUE/LE coordination during self care skills - however consistently integrates R side without prompting.  Left up in wheelchair with safety belt in place and engaged and call bell/ personal items in reach.    Therapy Documentation Precautions:  Precautions Precautions: Fall Precaution Comments: Rt hemiparesis Restrictions Weight Bearing Restrictions Per Provider Order: No   Vital Signs: Therapy Vitals Temp: (!) 97.4 F (36.3 C) Temp Source: Oral Pulse Rate: 71 Resp: 17 BP: 111/60 Patient Position (if appropriate): Sitting Oxygen Therapy SpO2: 96 % O2 Device: Room Air Pain:  Denies pain     Therapy/Group: Individual Therapy  Ulanda Tackett  M 10/30/2023, 8:30 AM

## 2023-10-31 DIAGNOSIS — I6381 Other cerebral infarction due to occlusion or stenosis of small artery: Secondary | ICD-10-CM | POA: Diagnosis not present

## 2023-10-31 MED ORDER — VALACYCLOVIR HCL 500 MG PO TABS: 1000.0000 mg | ORAL_TABLET | Freq: Three times a day (TID) | ORAL | Status: DC

## 2023-10-31 MED ORDER — VALACYCLOVIR HCL 500 MG PO TABS
1000.0000 mg | ORAL_TABLET | Freq: Three times a day (TID) | ORAL | Status: DC
Start: 2023-10-31 — End: 2023-10-31
  Administered 2023-10-31: 1000 mg via ORAL
  Filled 2023-10-31: qty 2

## 2023-10-31 MED ORDER — VITAMIN D 25 MCG (1000 UNIT) PO TABS
2000.0000 [IU] | ORAL_TABLET | Freq: Every day | ORAL | Status: DC
  Administered 2023-11-01 – 2023-11-05 (×5): 2000 [IU] via ORAL
  Filled 2023-10-31 (×5): qty 2

## 2023-10-31 NOTE — Group Note (Signed)
 Patient Details Name: Jordan Collier MRN: [id number] DOB: 03-25-1946 Today's Date: 10/31/2023  Time Calculation: OT Group Time Calculation OT Group Start Time: 0900 OT Group Stop Time: 1000 OT Group Time Calculation (min): 60 min      Group Description: Dance Group: Pt participated in dance group with an emphasis on social interaction, motor planning, increasing overall activity tolerance and bimanual tasks. All songs were selected by group members. Dance moves included AROM of BUE/BLE gross motor movements with an emphasis on building functional endurance.    Individual level documentation: Patient completed group from sitting level. Patientt needed supervision to complete various dance moves with OT providing visual model of dance moves. Patient able to create her own modifications during group. Pt motivating to other members throughout session.   Pain:  0/10  Precautions:  Falls   Jordan Collier 10/31/2023, 12:29 PM

## 2023-10-31 NOTE — Progress Notes (Signed)
 Physical Therapy Session Note  Patient Details  Name: Jordan Collier MRN: [id number] Date of Birth: [birth year]  Today's Date: 10/31/2023 PT Individual Time: 1345-1430 PT Individual Time Calculation (min): 45 min   Short Term Goals: Week 1:  PT Short Term Goal 1 (Week 1): Pt will complete bed mobility with CGA PT Short Term Goal 2 (Week 1): Pt will complete bed<>chair transfers with minA and LRAD PT Short Term Goal 3 (Week 1): Pt will ambulate at least 159ft with minA and LRAD PT Short Term Goal 4 (Week 1): Pt will improve BERG balance score by at least 8 points  Skilled Therapeutic Interventions/Progress Updates:      Pt sitting upright in wheelchair on arrival - has no c/o pain. Transported to main rehab gym for time management.   Sit<>stand to RW with CGA and cues for hand placement to push from arm rests. Ambulated 238ft with CGA/minA and RW - improved trunk alignment with reduced R trunk lean, improved stride length and gait speed, improved confidence, and improved R foot clearance! Pt pleased with progress.   Continued gait training with no AD to challenge stability and balance - ambulated 138ft with minA to start, fading to modA as she became fatigued resulting in R trunk lean and poor R foot clearance in swing.   Worked on RLE NMR for strengthening using the 6 stair and 2 hand rail for UE support. Completed 1x10 repeated toe taps on R foot with no resistance and then 3x12 toe taps with yellow TB resistance. Completed the same setup and dosage with lateral step ups. CGA for balance.   Pt assisted onto the [place] with minA stand pivot transfer. Resistance set to L30cm/sec. Encouraged AROM bilaterally and cadence challenge cardiovascular endurance. Completed x10 minutes.   Assisted back to her w/c with minA stand pivot transfer and then wheeled back to her room. Pt requesting to lie down 2/2 fatigue. MinA stand pivot and bed mobility completed with supervision. All needs met at  conclusion of session.   Therapy Documentation Precautions:  Precautions Precautions: Fall Precaution Comments: Rt hemiparesis Restrictions Weight Bearing Restrictions Per Provider Order: No General:   Vital Signs: Therapy Vitals Temp: 97.6 F (36.4 C) Pulse Rate: 63 Resp: 17 BP: 128/62 Patient Position (if appropriate): Lying Oxygen Therapy SpO2: 97 % O2 Device: Room Air Pain: Pain Assessment Pain Scale: 0-10 Pain Score: 0-No pain Mobility:   Locomotion :    Trunk/Postural Assessment :    Balance:   Exercises:   Other Treatments:      Therapy/Group: Individual Therapy  Sherlean SHAUNNA Perks 10/31/2023, 7:51 AM

## 2023-10-31 NOTE — Progress Notes (Signed)
 Occupational Therapy Session Note  Patient Details  Name: Jordan Collier MRN: [id number] Date of Birth: 1946/08/23  Today's Date: 10/31/2023 OT Individual Time: 8884-8842 OT Individual Time Calculation (min): 42 min    Short Term Goals: Week 1:  OT Short Term Goal 1 (Week 1): Pt will complete sit > stand in prep for ADL with CGA using LRAD OT Short Term Goal 2 (Week 1): Pt will complete 2/3 toileting steps with min A for dynamic balance OT Short Term Goal 3 (Week 1): Pt will complete toilet transfer with CGA using LRAD  Skilled Therapeutic Interventions/Progress Updates:    Patient received dressed and seated in wheelchair - straightening her bed to help the staff.  Patient transported to gym in wheelchair, and transferred to mat table to address functional use of RUE.  In sitting worked on reaching patterns with facilitation and cueing for proper mechanics, e.g. shoulder down, neck muscles relaxed.  Reaching mid to high level as guided movement to promote more natural muscle balance.  Worked on interlimb coordination with forearm gym in sitting then in standing.  Patient needs facilitation in sit to stand and standing for forward weight shift.  Patient initially standing with trunk behind base of support - able to replicate more midline alignment following facilitation.  Worked on hand strengthening with yellow putty - educated in grip and pinch - looking for more slow sustained activation in hands to promote more functional grasp.  Worked on static to slightly dynamic stand balance and weight shifting toward right foot to foster a pivot transfer.  Patient left up in wheelchair with safety belt in place and engaged and lunch tray/ cal bell in reach.    Therapy Documentation Precautions:  Precautions Precautions: Fall Precaution Comments: Rt hemiparesis Restrictions Weight Bearing Restrictions Per Provider Order: No   Pain:  Denies pain      Therapy/Group: Individual  Therapy  Atalia Litzinger M 10/31/2023, 12:18 PM

## 2023-10-31 NOTE — Progress Notes (Signed)
 Occupational Therapy Session Note  Patient Details  Name: Jordan Collier MRN: [id number] Date of Birth: [birth year]  Today's Date: 10/31/2023 OT Individual Time: 9260-9163 OT Individual Time Calculation (min): 57 min    Short Term Goals: Week 1:  OT Short Term Goal 1 (Week 1): Pt will complete sit > stand in prep for ADL with CGA using LRAD OT Short Term Goal 2 (Week 1): Pt will complete 2/3 toileting steps with min A for dynamic balance OT Short Term Goal 3 (Week 1): Pt will complete toilet transfer with CGA using LRAD  Skilled Therapeutic Interventions/Progress Updates:  Pt greeted supine in bed, pt agreeable to OT intervention.      Transfers/bed mobility/functional mobility: pt completed supine>sit with CGA. Sit>stand from EOB with CGA with RW, ambulatory transfer into bathroom with RW and MINA, impaired coordination in RLE during gait.    ADLs:  Grooming: pt completed seated oral care with supervision with education provided on compensatory hemi techniques UB dressing:pt donned [place] shirt with set- up assist, MIN verbal cues to carryover hemi technique of dressing RUE first LB dressing: pt donned pants and underwear with close supervision for balance during sit>stand, MIN verbal cues needed to recall dressing affected extremity first  Footwear: pt donned socks via figure 4 with set- up assist   Bathing: pt completed bathing seated/standing from shower seat with CGA- supervision, emphasis on RUE integration using RUE at non dominant level Transfers: ambulatory ADL transfers with RW and MIN A- CGA Toileting: pt completed 3/3 toileting tasks with close supervision, continent b/bvoid   Ended session with pt handed off directly to rehab tech for group session.             Therapy Documentation Precautions:  Precautions Precautions: Fall Precaution Comments: Rt hemiparesis Restrictions Weight Bearing Restrictions Per Provider Order: No  Pain: No pain    Therapy/Group:  Individual Therapy  Ronal Mallie Needy 10/31/2023, 12:10 PM

## 2023-10-31 NOTE — Progress Notes (Signed)
 Patient ID: Jordan Collier, female   DOB: 09-Sep-1946, 78 y.o.   MRN: [id number]  Message left for Cindi [place] to discuss plan for pt to go to SNF unit prior to returning to her independent apartment

## 2023-10-31 NOTE — IPOC Note (Signed)
 Overall Plan of Care [facility]) Patient Details Name: Jordan Collier MRN: [id number] DOB: 05-15-1946  Admitting Diagnosis: Thalamic stroke [facility])  Hospital Problems: Principal Problem:   Thalamic stroke (HCC)     Functional Problem List: Nursing Safety, Endurance, Medication Management  PT Balance, Endurance, Motor, Safety, Sensory, Skin Integrity  OT Balance, Endurance, Motor, Vision  SLP    TR         Basic ADL's: OT Eating, Grooming, Bathing, Dressing, Toileting     Advanced  ADL's: OT Simple Meal Preparation     Transfers: PT Bed Mobility, Bed to Chair, Customer Service Manager, Tub/Shower     Locomotion: PT Ambulation, Stairs     Additional Impairments: OT Fuctional Use of Upper Extremity  SLP        TR      Anticipated Outcomes Item Anticipated Outcome  Self Feeding Mod I  Swallowing      Basic self-care  Mod I  Toileting  Mod I   Bathroom Transfers Mod I  Bowel/Bladder  n/a  Transfers  supervision  Locomotion  supervision  Communication     Cognition     Pain  n/a  Safety/Judgment  manage w cues   Therapy Plan: PT Intensity: Minimum of 1-2 x/day ,45 to 90 minutes PT Frequency: 5 out of 7 days PT Duration Estimated Length of Stay: 2 weeks OT Intensity: Minimum of 1-2 x/day, 45 to 90 minutes OT Frequency: 5 out of 7 days OT Duration/Estimated Length of Stay: 12-14 days     Team Interventions: Nursing Interventions Patient/Family Education, Disease Management/Prevention, Discharge Planning, Medication Management  PT interventions Wheelchair propulsion/positioning, Stair training, Therapeutic Exercise, UE/LE Coordination activities, Skin care/wound management, Patient/family education, Neuromuscular re-education, Functional electrical stimulation, Warden/ranger, Community reintegration, Disease management/prevention, Ambulation/gait training, Cognitive remediation/compensation, Discharge planning, DME/adaptive equipment instruction,  Functional mobility training, Psychosocial support, Splinting/orthotics, Pain management, UE/LE Strength taining/ROM, Therapeutic Activities, Visual/perceptual remediation/compensation  OT Interventions Balance/vestibular training, Discharge planning, Functional electrical stimulation, Pain management, Self Care/advanced ADL retraining, Therapeutic Activities, UE/LE Coordination activities, Disease mangement/prevention, Functional mobility training, Patient/family education, Therapeutic Exercise, Visual/perceptual remediation/compensation, Community reintegration, Fish Farm Manager, Neuromuscular re-education, UE/LE Strength taining/ROM  SLP Interventions    TR Interventions    SW/CM Interventions Discharge Planning, Psychosocial Support, Patient/Family Education   Barriers to Discharge MD  Medical stability  Nursing Lack of/limited family support 1 level/level entry ILF apt; fully independent, driving, working 32 hrs/week at [employer], no DME at baseline  PT Lack of/limited family support, Decreased caregiver support, Community Education Officer for SNF coverage    OT Lack of/limited family support    SLP      SW       Team Discharge Planning: Destination: PT-Home ,OT- Home , SLP-Home (ILF) Projected Follow-up: PT-Home health PT, 24 hour supervision/assistance, OT-  Outpatient OT, SLP-None Projected Equipment Needs: PT-To be determined, OT- To be determined, SLP-None recommended by SLP Equipment Details: PT- , OT-  Patient/family involved in discharge planning: PT- Patient,  OT-Patient, SLP-Patient  MD ELOS: 12-16 days Medical Rehab Prognosis:  Excellent Assessment: The patient has been admitted for CIR therapies with the diagnosis of left thalamocapsular infarct. The team will be addressing functional mobility, strength, stamina, balance, safety, adaptive techniques and equipment, self-care, bowel and bladder mgt, patient and caregiver education. Goals have been set at modI. Anticipated  discharge destination is home.         See Team Conference Notes for weekly updates to the plan of care

## 2023-10-31 NOTE — Progress Notes (Signed)
 PROGRESS NOTE   Subjective/Complaints: Patient seen while she was in group dance class this morning.  She complains of some itchiness in left lateral side  ROS: +feels cold at 3am, +right sided weakness, +itchiness left lateral side.    Objective:   No results found. Recent Labs    10/29/23 0514 10/30/23 0658  WBC 9.3 9.8  HGB 12.6 13.1  HCT 37.8 39.7  PLT 318 340   Recent Labs    10/29/23 0514 10/30/23 0658  NA 134* 134*  K 4.6 4.5  CL 105 103  CO2 23 24  GLUCOSE 104* 102*  BUN 16 11  CREATININE 0.92 0.74  CALCIUM 8.8* 9.0    Intake/Output Summary (Last 24 hours) at 10/31/2023 1605 Last data filed at 10/31/2023 1300 Gross per 24 hour  Intake 997 ml  Output --  Net 997 ml        Physical Exam: Vital Signs Blood pressure 120/68, pulse 78, temperature 97.7 F (36.5 C), temperature source Oral, resp. rate 16, height 5' 8 (1.727 m), weight 67.7 kg, SpO2 100%. Gen: no distress, normal appearing HEENT: oral mucosa pink and moist, NCAT Cardio: Reg rate Chest: normal effort, normal rate of breathing Abd: soft, non-distended Ext: no edema Psych: pleasant, normal affect Musculoskeletal:        General: No swelling or tenderness. Normal range of motion.     Cervical back: Normal range of motion.  Skin:    General: Skin is warm and dry.  Neurological:     Mental Status: She is alert.     Comments: Alert and oriented x 3. Normal insight and awareness. Intact Memory. Normal language with mild dysarthria. Cranial nerve exam unremarkable except for right central VII and right hemifacial sensory loss. MMT: RUE is 3 to 3+/5 prox to distal. RLE 3+/5 prox to 4/5 distal. LUE and LLE 4+ to 5/5. Sensory exam notable for 1/2 to LT and pain in right arm and leg. Decreased FMC of right side with PD. No frank ataxia.  No abnl resting tone. DTR's 1+. Stable 10/31/23   Psychiatric:        Mood and Affect: Mood normal.         Behavior: Behavior normal.    Assessment/Plan: 1. Functional deficits which require 3+ hours per day of interdisciplinary therapy in a comprehensive inpatient rehab setting. Physiatrist is providing close team supervision and 24 hour management of active medical problems listed below. Physiatrist and rehab team continue to assess barriers to discharge/monitor patient progress toward functional and medical goals  Care Tool:  Bathing    Body parts bathed by patient: Right arm, Left arm, Chest, Abdomen, Front perineal area, Right upper leg, Left upper leg, Face, Right lower leg, Left lower leg, Buttocks   Body parts bathed by helper: Buttocks, Right lower leg, Left lower leg     Bathing assist Assist Level: Minimal Assistance - Patient > 75%     Upper Body Dressing/Undressing Upper body dressing   What is the patient wearing?: Pull over shirt    Upper body assist Assist Level: Supervision/Verbal cueing    Lower Body Dressing/Undressing Lower body dressing      What  is the patient wearing?: Underwear/pull up, Pants     Lower body assist Assist for lower body dressing: Minimal Assistance - Patient > 75%     Toileting Toileting    Toileting assist Assist for toileting: Minimal Assistance - Patient > 75%     Transfers Chair/bed transfer  Transfers assist     Chair/bed transfer assist level: Minimal Assistance - Patient > 75%     Locomotion Ambulation   Ambulation assist      Assist level: Moderate Assistance - Patient 50 - 74% Assistive device: No Device Max distance: 64ft   Walk 10 feet activity   Assist     Assist level: Moderate Assistance - Patient - 50 - 74% Assistive device: No Device   Walk 50 feet activity   Assist Walk 50 feet with 2 turns activity did not occur: Safety/medical concerns (RLE weakness)         Walk 150 feet activity   Assist Walk 150 feet activity did not occur: Safety/medical concerns         Walk 10 feet on  uneven surface  activity   Assist Walk 10 feet on uneven surfaces activity did not occur: Safety/medical concerns         Wheelchair     Assist Is the patient using a wheelchair?: Yes Type of Wheelchair: Manual    Wheelchair assist level: Total Assistance - Patient < 25% Max wheelchair distance: 93ft    Wheelchair 50 feet with 2 turns activity    Assist        Assist Level: Total Assistance - Patient < 25%   Wheelchair 150 feet activity     Assist      Assist Level: Total Assistance - Patient < 25%   Blood pressure 120/68, pulse 78, temperature 97.7 F (36.5 C), temperature source Oral, resp. rate 16, height 5' 8 (1.727 m), weight 67.7 kg, SpO2 100%.  Medical Problem List and Plan: 1. 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              -patient may  shower             -ELOS/Goals: 12-16 days, mod I goals             -she is having some waxing and waning sensory symptoms particularly in RLE. Discussed characteristics of these, particularly after thalamic stroke. Reassured her that it wasn't the sign of a progressive infarct  Team conference held 1/8  Grounds pass ordered 2.  Antithrombotics: -DVT/anticoagulation:  Pharmaceutical: Lovenox              -antiplatelet therapy: plavix  and 81mg  asa for 3 weeks then asa alone.  3. Pain Management: Tylenol  prn. 4. Mood/Behavior/Sleep: LCSW to follow for evaluation and support.             --melatonin prn for insomnia.              -antipsychotic agents: N/A             -pt periodically experiencing depressed mood. Feels that she can work through it. Encouraged her to reach out if things should change. 5. Neuropsych/cognition: This patient is capable of making decisions on her own behalf. 6. Skin/Wound Care: Routine pressure relief measures.  7. Hyponatremia: stabilized, d/c labs  8. Blood pressure: Monitor TID--controlled at without  meds  9. Hypercholesterolemia: LDL-196. Continue Zetia  and Leqvio pending approval. Discussed that she had prior negative side effects  to statin.  10. Hyperglycemia: discussed that CBGs are low, can d/c CBG checks  11. Low protein: educated patient regarding levels, Ensure ordered q24H prn   12. Vitamin D  insufficiency: D3 ordered, increase to 2,000U daily.   13. Constipation: d/c bisacodyl  suppository.   LOS: 3 days A FACE TO FACE EVALUATION WAS PERFORMED  Jordan Collier 10/31/2023, 4:05 PM

## 2023-11-01 DIAGNOSIS — K5900 Constipation, unspecified: Secondary | ICD-10-CM

## 2023-11-01 DIAGNOSIS — I10 Essential (primary) hypertension: Secondary | ICD-10-CM | POA: Diagnosis not present

## 2023-11-01 DIAGNOSIS — B029 Zoster without complications: Secondary | ICD-10-CM

## 2023-11-01 DIAGNOSIS — I6381 Other cerebral infarction due to occlusion or stenosis of small artery: Secondary | ICD-10-CM | POA: Diagnosis not present

## 2023-11-01 DIAGNOSIS — E871 Hypo-osmolality and hyponatremia: Secondary | ICD-10-CM

## 2023-11-01 MED ORDER — VALACYCLOVIR HCL 500 MG PO TABS
1000.0000 mg | ORAL_TABLET | Freq: Three times a day (TID) | ORAL | Status: AC
  Administered 2023-11-01 – 2023-11-08 (×21): 1000 mg via ORAL
  Filled 2023-11-01 (×21): qty 2

## 2023-11-01 NOTE — Progress Notes (Signed)
 PROGRESS NOTE   Subjective/Complaints: Patient showering with OT this morning. She is very concerned about a shingles outbreak this AM. She has had this several times in the past.   ROS: +feels cold at 3am, +right sided weakness, +itchiness left lateral side.  Denies CP/SOB, + L rib wall pain  Objective:   No results found. Recent Labs    10/30/23 0658  WBC 9.8  HGB 13.1  HCT 39.7  PLT 340   Recent Labs    10/30/23 0658  NA 134*  K 4.5  CL 103  CO2 24  GLUCOSE 102*  BUN 11  CREATININE 0.74  CALCIUM 9.0    Intake/Output Summary (Last 24 hours) at 11/01/2023 1027 Last data filed at 11/01/2023 0737 Gross per 24 hour  Intake 714 ml  Output --  Net 714 ml        Physical Exam: Vital Signs Blood pressure (!) 110/50, pulse (!) 58, temperature (!) 97.5 F (36.4 C), resp. rate 18, height 5' 8 (1.727 m), weight 67.7 kg, SpO2 97%. Gen: no distress, normal appearing, working with OT HEENT: oral mucosa pink and moist, NCAT Cardio: Reg rate Chest: normal effort, normal rate of breathing Abd: soft, non-distended Ext: no edema Psych: pleasant, normal affect Musculoskeletal:        General: No swelling or tenderness. Normal range of motion.     Cervical back: Normal range of motion.  Skin:    General: slightly redness R chest wall Neurological:     Mental Status: She is alert.     Comments: Alert and oriented x 3. Normal insight and awareness. Intact Memory. Normal language with mild dysarthria. Cranial nerve exam unremarkable except for right central VII and right hemifacial sensory loss. MMT: RUE is 3 to 3+/5 prox to distal. RLE 3+/5 prox to 4/5 distal. LUE and LLE 4+ to 5/5. Sensory exam notable for 1/2 to LT and pain in right arm and leg. Decreased FMC of right side with PD. No frank ataxia.  No abnl resting tone. DTR's 1+. Stable 10/31/23   Psychiatric:        Mood and Affect: Mood normal.        Behavior:  Behavior normal.    Assessment/Plan: 1. Functional deficits which require 3+ hours per day of interdisciplinary therapy in a comprehensive inpatient rehab setting. Physiatrist is providing close team supervision and 24 hour management of active medical problems listed below. Physiatrist and rehab team continue to assess barriers to discharge/monitor patient progress toward functional and medical goals  Care Tool:  Bathing    Body parts bathed by patient: Right arm, Left arm, Chest, Abdomen, Front perineal area, Right upper leg, Left upper leg, Face, Right lower leg, Left lower leg, Buttocks   Body parts bathed by helper: Buttocks, Right lower leg, Left lower leg     Bathing assist Assist Level: Minimal Assistance - Patient > 75%     Upper Body Dressing/Undressing Upper body dressing   What is the patient wearing?: Pull over shirt    Upper body assist Assist Level: Supervision/Verbal cueing    Lower Body Dressing/Undressing Lower body dressing      What is the patient  wearing?: Underwear/pull up, Pants     Lower body assist Assist for lower body dressing: Minimal Assistance - Patient > 75%     Toileting Toileting    Toileting assist Assist for toileting: Minimal Assistance - Patient > 75%     Transfers Chair/bed transfer  Transfers assist     Chair/bed transfer assist level: Minimal Assistance - Patient > 75%     Locomotion Ambulation   Ambulation assist      Assist level: Moderate Assistance - Patient 50 - 74% Assistive device: No Device Max distance: 26ft   Walk 10 feet activity   Assist     Assist level: Moderate Assistance - Patient - 50 - 74% Assistive device: No Device   Walk 50 feet activity   Assist Walk 50 feet with 2 turns activity did not occur: Safety/medical concerns (RLE weakness)         Walk 150 feet activity   Assist Walk 150 feet activity did not occur: Safety/medical concerns         Walk 10 feet on uneven  surface  activity   Assist Walk 10 feet on uneven surfaces activity did not occur: Safety/medical concerns         Wheelchair     Assist Is the patient using a wheelchair?: Yes Type of Wheelchair: Manual    Wheelchair assist level: Total Assistance - Patient < 25% Max wheelchair distance: 42ft    Wheelchair 50 feet with 2 turns activity    Assist        Assist Level: Total Assistance - Patient < 25%   Wheelchair 150 feet activity     Assist      Assist Level: Total Assistance - Patient < 25%   Blood pressure (!) 110/50, pulse (!) 58, temperature (!) 97.5 F (36.4 C), resp. rate 18, height 5' 8 (1.727 m), weight 67.7 kg, SpO2 97%.  Medical Problem List and Plan: 1. 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              -patient may  shower             -ELOS/Goals: 12-16 days, mod I goals             -she is having some waxing and waning sensory symptoms particularly in RLE. Discussed characteristics of these, particularly after thalamic stroke. Reassured her that it wasn't the sign of a progressive infarct  Team conference held 1/8  Grounds pass ordered 2.  Antithrombotics: -DVT/anticoagulation:  Pharmaceutical: Lovenox              -antiplatelet therapy: plavix  and 81mg  asa for 3 weeks then asa alone.  3. Pain Management: Tylenol  prn. 4. Mood/Behavior/Sleep: LCSW to follow for evaluation and support.             --melatonin prn for insomnia.              -antipsychotic agents: N/A             -pt periodically experiencing depressed mood. Feels that she can work through it. Encouraged her to reach out if things should change. 5. Neuropsych/cognition: This patient is capable of making decisions on her own behalf. 6. Skin/Wound Care: Routine pressure relief measures.  7. Hyponatremia: stabilized, d/c labs  -Sodium 134 on 1/9, appears to be rendered she normally runs  8. Blood pressure:  Monitor TID--controlled at without meds  -1/11 stable continue to monitor  11/01/2023    4:14 AM 10/31/2023    7:33 PM 10/31/2023    1:19 PM  Vitals with BMI  Systolic 110 100 879  Diastolic 50 67 68  Pulse 58 81 78     9. Hypercholesterolemia: LDL-196. Continue Zetia  and Leqvio pending approval. Discussed that she had prior negative side effects to statin.  10. Hyperglycemia: discussed that CBGs are low, can d/c CBG checks  11. Low protein: educated patient regarding levels, Ensure ordered q24H prn   12. Vitamin D  insufficiency: D3 ordered, increase to 2,000U daily.   13. Constipation: d/c bisacodyl  suppository.  -LBM today 1/11, continue to monitor  14.  Left rib area pain.  Slight redness noted.  Concern for shingles  -Will start valtrex  1g TID, discussed with pharmacy- appreciate assistance    LOS: 4 days A FACE TO FACE EVALUATION WAS PERFORMED  Murray Collier 11/01/2023, 10:27 AM

## 2023-11-01 NOTE — Progress Notes (Signed)
 Occupational Therapy Session Note  Patient Details  Name: Jordan Collier MRN: [id number] Date of Birth: [birth year]  Today's Date: 11/01/2023 Visit 1: OT Individual Time: 0900-1000 OT Individual Time Calculation (min): 60 min   Visit 2: OT Individual Time:  1302- 1400 OT Individual Time Calculation (min): 58 min  Visit 3: OT Individual Time:  1500-1530 OT Individual Time Calculation (min): 30 min   Short Term Goals: Week 1:  OT Short Term Goal 1 (Week 1): Pt will complete sit > stand in prep for ADL with CGA using LRAD OT Short Term Goal 2 (Week 1): Pt will complete 2/3 toileting steps with min A for dynamic balance OT Short Term Goal 3 (Week 1): Pt will complete toilet transfer with CGA using LRAD Week 9:    Skilled Therapeutic Interventions/Progress Updates:    Visit 1:   Patient received seated in wheelchair ready for OT session, eager to take a shower.  Patient states - I cut my french toast today!  I think that putty is helping.  Patient issued yellow putty yesterday to address sustained grip and pinch.  Patient walked to bathroom without device and mod/min facilitation to address weight shift and reducing effort for swing of RLE.  Patient responds to cueing and facilitation well.  Walked to toilet for continent void.  Patient able to complete hygiene, then walk to shower.  Removed padded cutout shower seat, and replaced with solid tub bench.  Patient showering with supervision - close supervision for pericare in standing.  Patient very aware of safety precautions and following closely.  Patient left up in wheelchair at sink cleaning her teeth.  Patient pleased with her progress and very focused for continued functional improvement.  Safety belt in place and engaged and call bell in reach.    Visit 2: Transported patient to gym to address functional mobility.  Worked on sit to stand and stand to sit transitions.  Worked on weight shifting in standing.  Patient with pronounced  weight shift right and unable to recover her balance without mod facilitation.  Worked on upright posture in standing and weight shifting forward onto LEFT foot before stepping with right foot.  When she was able to weight shift well, she did not demonstrate the overactive swing of R leg.   Seated rest break and worked to gain objective data regarding coordination.   Box and blocks:  Left hand (dominant) 51 blocks  R hand 25 blocks in one monute 9 hole peg test:  R 1 min 10 sec. Returned to room and left up in wheelchair with safety belt in place and call bell in reach.    Visit 3: Patient received dozing in wheelchair.  Patient remained slightly sleepy throughout this session.  Neuromuscular reeducation of RUE in supine then sidelying to activate scapular stabilizers.  Emphasis on interlimb coordination, directional changes, more balanced muscle activation and more sustained arm activation.  Followed with quadruped to child's pose.  Patient able to independently obtain quarduped position from supine - then return to sitting at edge of mat without physical assistance.  Returned to room and asked to lie down in bed.  Bed alarm engaged and personal items in reach as directed.  Replaced lap belt alarm with chair pad alarm for wheelchair.    Therapy Documentation Precautions:  Precautions Precautions: Fall Precaution Comments: Rt hemiparesis Restrictions Weight Bearing Restrictions Per Provider Order: No   Pain: Visit 1:   Denies pain - concerned regarding occasional sensation under left breast -  she feels may be shingles - spoke with rounding MD this am.  Visit 2: Denies pain Visit 3:   Denies pain      Therapy/Group: Individual Therapy  Chesky Heyer M 11/01/2023, 12:50 PM

## 2023-11-02 ENCOUNTER — Encounter (HOSPITAL_COMMUNITY): Payer: Self-pay | Admitting: Physical Medicine and Rehabilitation

## 2023-11-02 DIAGNOSIS — I10 Essential (primary) hypertension: Secondary | ICD-10-CM | POA: Diagnosis not present

## 2023-11-02 DIAGNOSIS — I6381 Other cerebral infarction due to occlusion or stenosis of small artery: Secondary | ICD-10-CM | POA: Diagnosis not present

## 2023-11-02 DIAGNOSIS — B029 Zoster without complications: Secondary | ICD-10-CM | POA: Diagnosis not present

## 2023-11-02 DIAGNOSIS — E871 Hypo-osmolality and hyponatremia: Secondary | ICD-10-CM | POA: Diagnosis not present

## 2023-11-02 NOTE — Progress Notes (Addendum)
 PROGRESS NOTE   Subjective/Complaints: Patient sitting in bedside chair today.  She reports she continues to have occasional left rib wall pain.  She has not noticed this shingles rash yet.  There is a small sharp area of toenail on her 1st toe that sometimes gets caught on socks.  ROS: +feels cold at 3am, +right sided weakness, +itchiness left lateral side.  Denies CP/SOB/abdominal pain + L rib wall pain  Objective:   No results found. No results for input(s): WBC, HGB, HCT, PLT in the last 72 hours.  No results for input(s): NA, K, CL, CO2, GLUCOSE, BUN, CREATININE, CALCIUM in the last 72 hours.   Intake/Output Summary (Last 24 hours) at 11/02/2023 1734 Last data filed at 11/02/2023 1217 Gross per 24 hour  Intake 737 ml  Output --  Net 737 ml        Physical Exam: Vital Signs Blood pressure (!) 132/58, pulse 85, temperature 98.2 F (36.8 C), resp. rate 17, height 5' 8 (1.727 m), weight 67.7 kg, SpO2 99%. Gen: no distress, normal appearing, sitting in chair HEENT: oral mucosa pink and moist, NCAT Cardio: Reg rate Chest: normal effort, normal rate of breathing Abd: soft, non-distended Ext: no edema, small pointing area of her proximal toenail great toe Psych: pleasant, normal affect Musculoskeletal:        General: No swelling or tenderness. Normal range of motion.     Cervical back: Normal range of motion.  Skin:    General: Warm and dry Neurological:     Mental Status: She is alert.     Comments: Alert and oriented x 3. Normal insight and awareness. Intact Memory. Normal language with mild dysarthria. Cranial nerve exam unremarkable except for right central VII and right hemifacial sensory loss. MMT: RUE is 3 to 3+/5 prox to distal. RLE 3+/5 prox to 4/5 distal. LUE and LLE 4+ to 5/5. Sensory exam notable for 1/2 to LT and pain in right arm and leg. Decreased FMC of right side with PD. No  frank ataxia.  No abnl resting tone. DTR's 1+. Stable 10/31/23   Psychiatric:        Mood and Affect: Very pleasant    Behavior: Behavior normal.    Assessment/Plan: 1. Functional deficits which require 3+ hours per day of interdisciplinary therapy in a comprehensive inpatient rehab setting. Physiatrist is providing close team supervision and 24 hour management of active medical problems listed below. Physiatrist and rehab team continue to assess barriers to discharge/monitor patient progress toward functional and medical goals  Care Tool:  Bathing    Body parts bathed by patient: Right arm, Left arm, Chest, Abdomen, Front perineal area, Right upper leg, Left upper leg, Face, Right lower leg, Left lower leg, Buttocks   Body parts bathed by helper: Buttocks, Right lower leg, Left lower leg     Bathing assist Assist Level: Minimal Assistance - Patient > 75%     Upper Body Dressing/Undressing Upper body dressing   What is the patient wearing?: Pull over shirt    Upper body assist Assist Level: Supervision/Verbal cueing    Lower Body Dressing/Undressing Lower body dressing      What is the patient  wearing?: Underwear/pull up, Pants     Lower body assist Assist for lower body dressing: Minimal Assistance - Patient > 75%     Toileting Toileting    Toileting assist Assist for toileting: Minimal Assistance - Patient > 75%     Transfers Chair/bed transfer  Transfers assist     Chair/bed transfer assist level: Minimal Assistance - Patient > 75%     Locomotion Ambulation   Ambulation assist      Assist level: Moderate Assistance - Patient 50 - 74% Assistive device: No Device Max distance: 95ft   Walk 10 feet activity   Assist     Assist level: Moderate Assistance - Patient - 50 - 74% Assistive device: No Device   Walk 50 feet activity   Assist Walk 50 feet with 2 turns activity did not occur: Safety/medical concerns (RLE weakness)         Walk  150 feet activity   Assist Walk 150 feet activity did not occur: Safety/medical concerns         Walk 10 feet on uneven surface  activity   Assist Walk 10 feet on uneven surfaces activity did not occur: Safety/medical concerns         Wheelchair     Assist Is the patient using a wheelchair?: Yes Type of Wheelchair: Manual    Wheelchair assist level: Total Assistance - Patient < 25% Max wheelchair distance: 57ft    Wheelchair 50 feet with 2 turns activity    Assist        Assist Level: Total Assistance - Patient < 25%   Wheelchair 150 feet activity     Assist      Assist Level: Total Assistance - Patient < 25%   Blood pressure (!) 132/58, pulse 85, temperature 98.2 F (36.8 C), resp. rate 17, height 5' 8 (1.727 m), weight 67.7 kg, SpO2 99%.  Medical Problem List and Plan: 1. 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              -patient may  shower             -ELOS/Goals: 12-16 days, mod I goals             -she is having some waxing and waning sensory symptoms particularly in RLE. Discussed characteristics of these, particularly after thalamic stroke. Reassured her that it wasn't the sign of a progressive infarct  Team conference held 1/8  Grounds pass ordered 2.  Antithrombotics: -DVT/anticoagulation:  Pharmaceutical: Lovenox              -antiplatelet therapy: plavix  and 81mg  asa for 3 weeks then asa alone.  3. Pain Management: Tylenol  prn. 4. Mood/Behavior/Sleep: LCSW to follow for evaluation and support.             --melatonin prn for insomnia.              -antipsychotic agents: N/A             -pt periodically experiencing depressed mood. Feels that she can work through it. Encouraged her to reach out if things should change. 5. Neuropsych/cognition: This patient is capable of making decisions on her own behalf. 6. Skin/Wound Care: Routine pressure relief measures.  7.  Hyponatremia: stabilized, d/c labs  -Sodium 134 on 1/9, appears to be rendered she normally runs  Recheck on labs tomorrow  8. Blood pressure: Monitor TID--controlled at without meds  -1/12 stable continue to monitor  11/02/2023    1:31 PM 11/02/2023    4:59 AM 11/01/2023    7:48 PM  Vitals with BMI  Systolic 132 131 878  Diastolic 58 70 56  Pulse 85 76 80     9. Hypercholesterolemia: LDL-196. Continue Zetia  and Leqvio pending approval. Discussed that she had prior negative side effects to statin.  10. Hyperglycemia: discussed that CBGs are low, can d/c CBG checks  -Recheck on next BMP tomorrow   11. Low protein: educated patient regarding levels, Ensure ordered q24H prn   12. Vitamin D  insufficiency: D3 ordered, increase to 2,000U daily.   13. Constipation: d/c bisacodyl  suppository.  -LBM today 1/12, continue to monitor  14.  Left rib area pain.  Slight redness noted.  Concern for shingles  -Will start valtrex  1g TID, discussed with pharmacy- appreciate assistance   -1/12 continue Valtrex , monitor for possible rash   LOS: 5 days A FACE TO FACE EVALUATION WAS PERFORMED  Murray Collier 11/02/2023, 5:34 PM

## 2023-11-03 DIAGNOSIS — I6381 Other cerebral infarction due to occlusion or stenosis of small artery: Secondary | ICD-10-CM | POA: Diagnosis not present

## 2023-11-03 LAB — CBC
HCT: 37.9 % (ref 36.0–46.0)
Hemoglobin: 12.7 g/dL (ref 12.0–15.0)
MCH: 27.8 pg (ref 26.0–34.0)
MCHC: 33.5 g/dL (ref 30.0–36.0)
MCV: 82.9 fL (ref 80.0–100.0)
Platelets: 326 10*3/uL (ref 150–400)
RBC: 4.57 MIL/uL (ref 3.87–5.11)
RDW: 14.1 % (ref 11.5–15.5)
WBC: 9.7 10*3/uL (ref 4.0–10.5)
nRBC: 0 % (ref 0.0–0.2)

## 2023-11-03 LAB — BASIC METABOLIC PANEL
Anion gap: 8 (ref 5–15)
BUN: 15 mg/dL (ref 8–23)
CO2: 24 mmol/L (ref 22–32)
Calcium: 9.2 mg/dL (ref 8.9–10.3)
Chloride: 101 mmol/L (ref 98–111)
Creatinine, Ser: 0.8 mg/dL (ref 0.44–1.00)
GFR, Estimated: >60 mL/min (ref >60)
Glucose, Bld: 96 mg/dL (ref 70–99)
Potassium: 4.4 mmol/L (ref 3.5–5.1)
Sodium: 133 mmol/L — ABNORMAL LOW (ref 135–145)

## 2023-11-03 NOTE — Progress Notes (Signed)
 Physical Therapy Session Note  Patient Details  Name: Jordan Collier MRN: [id number] Date of Birth: [birth year]  Today's Date: 11/03/2023 PT Individual Time: 8954-8842 + 1330-1415 PT Individual Time Calculation (min): 72 min  + 45 min  Short Term Goals: Week 1:  PT Short Term Goal 1 (Week 1): Pt will complete bed mobility with CGA PT Short Term Goal 2 (Week 1): Pt will complete bed<>chair transfers with minA and LRAD PT Short Term Goal 3 (Week 1): Pt will ambulate at least 123ft with minA and LRAD PT Short Term Goal 4 (Week 1): Pt will improve BERG balance score by at least 8 points  Skilled Therapeutic Interventions/Progress Updates:      1st session: Pt sitting in wheelchair to start - no reports of pain.   Transported to main rehab gym for time. Sit<>stand to RW with light minA - ambulates with CGA/minA and RW from main gym to [employer], ~16ft - cues for upright posture, forward gaze, R hip instability with turns. Cues for increasing R step length and height.   Focused on NMR for RLE strengthening: -1x20 repeated sit<>stands with no UE support -1x20 repeated sit<>stands with yellow TB resistance around hips with CGA -1x12 standing hip abd with yellow TB resistance around ankles with BUE support on counter -1x20 standing heel raises with BUE support on counter -2x20 hip abd in hooklying with yellow TB resistance around knees -1x15 bridges with yellow TB resistance around knees -Quadruped unilateral hip ext 1x10 on R and 1x10 on L -Quadruped bird dogs 1x6 alternating arm/leg extensions -dead bug isometric holds -dead bug 2x8 alternating arm/leg extensions  Transported back to her room at wheelchair level - patient electing to stay sitting upright in wheelchair with her needs within reach - wanting to make a few phone calls to apply for disability insurance.   Discussed during rest breaks goals for DC - patient hopeful to return to ILF and avoid short term SNF - discussed  primary barriers, mobility and iADLs needed for returning to that level of living, and other various setup requirements. Pt asking about her ELOS and discussed team conf will be held this week and date will be determined then. Pt is an excellent CIR candidate and continues to make strong functional gains.      2nd session: Pt sitting in wheelchair and ready for therapy.  Has no c/o pain.  Transported at wheelchair level to main rehab gym. Introduced advertising account planner (rollator) as AD to progress and assist with return to iADLs. Educated her on precautions and safety measures. Gait training completed using rollator 3x223ft (seated rest breaks) with CGA. Patient with adequate safety awareness while managing RW - needs close CGA for turns and while turning to sit/stand from rollator. VC for increasing R hip/knee flexion in swing phase with exaggerated heel strike to improve foot clearance and natural gait.   Worked on RLE NMR for strengthening to carryover into functional gait. 2.5# ankle weight added to RLE. Completed reciprocal stepping with RLE only (LLE planted on 1st step) and RLE stepping up to the 2nd and back to ground level with 2 hand rail support and CGA for safety. Then completed standing hamstring curls with 2.5# ankle weight by tapping toe on R on top of 6 step while backward facing.   Last ambulation trial completed with 2.5# ankle weight on RLE to encourage strengthening, awareness, and proprioceptive feedback while working on R hip/knee flexion and R heel strike on initial contact.   Assisted  to bed at end of treatment. Left with all needs met and call bell in reach.       Therapy Documentation Precautions:  Precautions Precautions: Fall Precaution Comments: Rt hemiparesis Restrictions Weight Bearing Restrictions Per Provider Order: No General:   Vital Signs: Therapy Vitals Temp: 97.8 F (36.6 C) Pulse Rate: 67 Resp: 17 BP: 117/73 Oxygen Therapy SpO2: 97 % Pain:    Mobility:   Locomotion :    Trunk/Postural Assessment :    Balance:   Exercises:   Other Treatments:      Therapy/Group: Individual Therapy  Sherlean SHAUNNA Perks 11/03/2023, 7:52 AM

## 2023-11-03 NOTE — NC FL2 (Signed)
 [place]  MEDICAID FL2 LEVEL OF CARE FORM     IDENTIFICATION  Patient Name: Jordan Collier Birthdate: 22-Jan-1946 Sex: female Admission Date (Current Location): 10/28/2023  [facility] and [place] Number:  Producer, Television/film/video and Address:  [facility] [place], 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   Hyperlipemia 10/27/2023   Stroke determined by clinical assessment (HCC) 10/24/2023    Orientation RESPIRATION BLADDER Height & Weight     Self, Time, Situation, Place  Normal Continent Weight: 149 lb 4 oz (67.7 kg) Height:  5' 8 (172.7 cm)  BEHAVIORAL SYMPTOMS/MOOD NEUROLOGICAL BOWEL NUTRITION STATUS      Continent Diet (Heart healthy thin liquids)  AMBULATORY STATUS COMMUNICATION OF NEEDS Skin   Limited Assist Verbally Normal                       Personal Care Assistance Level of Assistance  Bathing, Dressing Bathing Assistance: Limited assistance   Dressing Assistance: Limited assistance     Functional Limitations Info             SPECIAL CARE FACTORS FREQUENCY  PT (By licensed PT), OT (By licensed OT), Speech therapy     PT Frequency: 5x week OT Frequency: 5x week     Speech Therapy Frequency: 3-5 x week      Contractures Contractures Info: Not present    Additional Factors Info  Code Status, Allergies Code Status Info: limited DNR Allergies Info: Decadron, Molds and Smuts, Penicillins, Lanolin, Statins, Monacus Purpureus Went yeast           Current  Medications (11/03/2023):  This is the current hospital active medication list Current Facility-Administered Medications  Medication Dose Route Frequency Provider Last Rate Last Admin   acetaminophen  (TYLENOL ) tablet 325-650 mg  325-650 mg Oral Q4H PRN Love, Pamela S, PA-C       alum & mag hydroxide-simeth (MAALOX/MYLANTA) 200-200-20 MG/5ML suspension 30 mL  30 mL Oral Q4H PRN Love, Pamela S, PA-C       aspirin  EC tablet 81 mg  81 mg Oral Daily Love, Pamela S, PA-C   81 mg at 11/03/23 0747   cholecalciferol  (VITAMIN D3) 25 MCG (1000 UNIT) tablet 2,000 Units  2,000 Units Oral Daily Raulkar, Krutika P, MD   2,000 Units at 11/03/23 0747   clopidogrel  (PLAVIX ) tablet 75 mg  75 mg Oral Daily Reome, Earle J, RPH   75 mg at 11/03/23 0747   diphenhydrAMINE  (BENADRYL ) capsule 25 mg  25 mg Oral Q6H PRN Love, Pamela S, PA-C       ezetimibe  (ZETIA ) tablet 10 mg  10 mg Oral Daily Love, Pamela S, PA-C   10 mg at 11/03/23 0747   feeding supplement (ENSURE ENLIVE / ENSURE PLUS) liquid 237 mL  237 mL Oral Q24H Raulkar, Sven SQUIBB, MD   237 mL at 11/02/23 1552   guaiFENesin -dextromethorphan (ROBITUSSIN DM) 100-10 MG/5ML syrup 5-10 mL  5-10 mL Oral Q6H PRN Love, Pamela S, PA-C  Oral care mouth rinse  15 mL Mouth Rinse PRN Love, Pamela S, PA-C       prochlorperazine  (COMPAZINE ) tablet 5-10 mg  5-10 mg Oral Q6H PRN Love, Pamela S, PA-C       Or   prochlorperazine  (COMPAZINE ) suppository 12.5 mg  12.5 mg Rectal Q6H PRN Love, Pamela S, PA-C       Or   prochlorperazine  (COMPAZINE ) injection 5-10 mg  5-10 mg Intravenous Q6H PRN Love, Pamela S, PA-C       senna-docusate (Senokot-S) tablet 1 tablet  1 tablet Oral QHS PRN Love, Pamela S, PA-C       sodium phosphate (FLEET) enema 1 enema  1 enema Rectal Once PRN Love, Pamela S, PA-C       valACYclovir  (VALTREX ) tablet 1,000 mg  1,000 mg Oral TID Urbano Albright, MD   1,000 mg at 11/03/23 0747   vitamin B-12 (CYANOCOBALAMIN ) tablet 100 mcg  100 mcg Oral Daily Love,  Pamela S, PA-C   100 mcg at 11/03/23 9252   white petrolatum  (VASELINE) gel   Topical PRN Raulkar, Sven SQUIBB, MD         Discharge Medications: Please see discharge summary for a list of discharge medications.  Relevant Imaging Results:  Relevant Lab Results:   Additional Information SSN:[id number]  [name] MATSU, LCSW

## 2023-11-03 NOTE — Progress Notes (Addendum)
 Occupational Therapy Session Note  Patient Details  Name: Jordan Collier MRN: [id number] Date of Birth: [birth year]  Today's Date: 11/03/2023 OT Individual Time: 9094-8982 OT Individual Time Calculation (min): 72 min    Short Term Goals: Week 1:  OT Short Term Goal 1 (Week 1): Pt will complete sit > stand in prep for ADL with CGA using LRAD OT Short Term Goal 2 (Week 1): Pt will complete 2/3 toileting steps with min A for dynamic balance OT Short Term Goal 3 (Week 1): Pt will complete toilet transfer with CGA using LRAD  Skilled Therapeutic Interventions/Progress Updates:    Patient received seated in wheelchair ready for therapy.  Patient remains very motivated for improvement, and is progressing steadily.  Patient walked with min assist without device to bathroom.  Continent void on toilet - uses grab bars to stand for hygiene and clothing management.  Stood to remove underwear!  Seated for shower and showering / dressing with intermittent supervision following set up.  Patient demonstrating improve overhead reach with RUE when reaching to high shelf.   Patient transported to gym for neuromuscular reeducation of RUE/LE. Started in modified plantigrade to load BUE and work to improve active elbow extension.  Patient needs cueing and facilitation for full elbow extension.  Worked on dynamic stand balance while using  RUE  to reach overhead.  Patient returned to room and left up in wheelchair with chair pad alarm in place and engaged and personal items in reach.    Therapy Documentation Precautions:  Precautions Precautions: Fall Precaution Comments: Rt hemiparesis Restrictions Weight Bearing Restrictions Per Provider Order: No   Pain: Pain Assessment Pain Scale: 0-10 Pain Score: 0-No painDenies pain     Therapy/Group: Individual Therapy  Jordan Collier M 11/03/2023, 12:17 PM

## 2023-11-03 NOTE — Progress Notes (Signed)
 PROGRESS NOTE   Subjective/Complaints: Asks how her bloodwork looks, discussed that it looks excellent Discussed her improved ambulation, d/c of lovenox   ROS: +feels cold at 3am, +right sided weakness, +itchiness left lateral side.  Denies CP/SOB/abdominal pain + L rib wall pain-improved  Objective:   No results found. Recent Labs    11/03/23 0521  WBC 9.7  HGB 12.7  HCT 37.9  PLT 326    Recent Labs    11/03/23 0521  NA 133*  K 4.4  CL 101  CO2 24  GLUCOSE 96  BUN 15  CREATININE 0.80  CALCIUM 9.2     Intake/Output Summary (Last 24 hours) at 11/03/2023 1041 Last data filed at 11/03/2023 0700 Gross per 24 hour  Intake 320 ml  Output --  Net 320 ml        Physical Exam: Vital Signs Blood pressure 117/73, pulse 67, temperature 97.8 F (36.6 C), resp. rate 17, height 5' 8 (1.727 m), weight 67.7 kg, SpO2 97%. Gen: no distress, normal appearing, sitting in chair HEENT: oral mucosa pink and moist, NCAT Cardio: Reg rate Chest: normal effort, normal rate of breathing Abd: soft, non-distended Ext: no edema, small pointing area of her proximal toenail great toe Psych: pleasant, normal affect Musculoskeletal:        General: No swelling or tenderness. Normal range of motion.     Cervical back: Normal range of motion.  Skin:    General: Warm and dry Neurological:     Mental Status: She is alert.     Comments: Alert and oriented x 3. Normal insight and awareness. Intact Memory. Normal language with mild dysarthria. Cranial nerve exam unremarkable except for right central VII and right hemifacial sensory loss. MMT: RUE is 3 to 3+/5 prox to distal. RLE 3+/5 prox to 4/5 distal. LUE and LLE 4+ to 5/5. Sensory exam notable for 1/2 to LT and pain in right arm and leg. Decreased FMC of right side with PD. No frank ataxia.  No abnl resting tone. DTR's 1+. Stable 11/03/23 Psychiatric:        Mood and Affect: Very  pleasant    Behavior: Behavior normal.    Assessment/Plan: 1. Functional deficits which require 3+ hours per day of interdisciplinary therapy in a comprehensive inpatient rehab setting. Physiatrist is providing close team supervision and 24 hour management of active medical problems listed below. Physiatrist and rehab team continue to assess barriers to discharge/monitor patient progress toward functional and medical goals  Care Tool:  Bathing    Body parts bathed by patient: Right arm, Left arm, Chest, Abdomen, Front perineal area, Right upper leg, Left upper leg, Face, Right lower leg, Left lower leg, Buttocks   Body parts bathed by helper: Buttocks, Right lower leg, Left lower leg     Bathing assist Assist Level: Minimal Assistance - Patient > 75%     Upper Body Dressing/Undressing Upper body dressing   What is the patient wearing?: Pull over shirt    Upper body assist Assist Level: Supervision/Verbal cueing    Lower Body Dressing/Undressing Lower body dressing      What is the patient wearing?: Underwear/pull up, Pants  Lower body assist Assist for lower body dressing: Minimal Assistance - Patient > 75%     Toileting Toileting    Toileting assist Assist for toileting: Minimal Assistance - Patient > 75%     Transfers Chair/bed transfer  Transfers assist     Chair/bed transfer assist level: Minimal Assistance - Patient > 75%     Locomotion Ambulation   Ambulation assist      Assist level: Moderate Assistance - Patient 50 - 74% Assistive device: No Device Max distance: 62ft   Walk 10 feet activity   Assist     Assist level: Moderate Assistance - Patient - 50 - 74% Assistive device: No Device   Walk 50 feet activity   Assist Walk 50 feet with 2 turns activity did not occur: Safety/medical concerns (RLE weakness)         Walk 150 feet activity   Assist Walk 150 feet activity did not occur: Safety/medical concerns          Walk 10 feet on uneven surface  activity   Assist Walk 10 feet on uneven surfaces activity did not occur: Safety/medical concerns         Wheelchair     Assist Is the patient using a wheelchair?: Yes Type of Wheelchair: Manual    Wheelchair assist level: Total Assistance - Patient < 25% Max wheelchair distance: 43ft    Wheelchair 50 feet with 2 turns activity    Assist        Assist Level: Total Assistance - Patient < 25%   Wheelchair 150 feet activity     Assist      Assist Level: Total Assistance - Patient < 25%   Blood pressure 117/73, pulse 67, temperature 97.8 F (36.6 C), resp. rate 17, height 5' 8 (1.727 m), weight 67.7 kg, SpO2 97%.  Medical Problem List and Plan: 1. 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              -patient may  shower             -ELOS/Goals: 12-16 days, mod I goals             -she is having some waxing and waning sensory symptoms particularly in RLE. Discussed characteristics of these, particularly after thalamic stroke. Reassured her that it wasn't the sign of a progressive infarct  Team conference held 1/8  Grounds pass ordered  Continue CIR 2.  Antithrombotics: -DVT/anticoagulation:  Pharmaceutical: Lovenox              -antiplatelet therapy: plavix  and 81mg  asa for 3 weeks then asa alone.  3. Pain Management: Tylenol  prn.  4. Insomnia: well controlled, d/c melatonin 5. Neuropsych/cognition: This patient is capable of making decisions on her own behalf. 6. Skin/Wound Care: Routine pressure relief measures.   7. Hyponatremia: stabilized, d/c labs  Discussed adding pinch of salt to water in morning  8. Blood pressure: well controlled     11/03/2023    4:55 AM 11/02/2023    8:01 PM 11/02/2023    1:31 PM  Vitals with BMI  Systolic 117 132 867  Diastolic 73 66 58  Pulse 67 76 85     9. Hypercholesterolemia: LDL-196. continue Zetia  and  Leqvio pending approval. Discussed that she had prior negative side effects to statin.  10. Hyperglycemia: discussed that CBGs are lwell controlled can d/c CBG checks  11. Low protein: educated patient regarding levels, Ensure ordered q24H  prn   12. Vitamin D  insufficiency: D3 ordered, increase to 2,000U daily.   13. Constipation: d/c bisacodyl  suppository.  14.  Left rib area pain.  Slight redness noted.  Concern for shingles  -conitnue Valtrex  1g TID, discussed with pharmacy- appreciate assistance   15. Polypharmacy: d/c lovenox  given improved ambulation   LOS: 6 days A FACE TO FACE EVALUATION WAS PERFORMED  Sven SQUIBB Andrian Urbach 11/03/2023, 10:41 AM

## 2023-11-04 DIAGNOSIS — I6381 Other cerebral infarction due to occlusion or stenosis of small artery: Secondary | ICD-10-CM | POA: Diagnosis not present

## 2023-11-04 DIAGNOSIS — E559 Vitamin D deficiency, unspecified: Secondary | ICD-10-CM | POA: Insufficient documentation

## 2023-11-04 LAB — GLUCOSE, RANDOM: Glucose, Bld: 104 mg/dL — ABNORMAL HIGH (ref 70–99)

## 2023-11-04 NOTE — Progress Notes (Signed)
 Physical Therapy Session Note  Patient Details  Name: Jordan Collier MRN: [id number] Date of Birth: 1945-10-25  Today's Date: 11/04/2023 PT Individual Time: 0900-1000 + 8954-8843 PT Individual Time Calculation (min): 60 min  + 71 min  Short Term Goals: Week 1:  PT Short Term Goal 1 (Week 1): Pt will complete bed mobility with CGA PT Short Term Goal 2 (Week 1): Pt will complete bed<>chair transfers with minA and LRAD PT Short Term Goal 3 (Week 1): Pt will ambulate at least 111ft with minA and LRAD PT Short Term Goal 4 (Week 1): Pt will improve BERG balance score by at least 8 points  Skilled Therapeutic Interventions/Progress Updates:      1st session: Pt sitting up in wheelchair and ready for therapy - has no c/o pain. Sit<>Stand to rollator with CGA with cues for brakes and hand placement. Ambulated with close supervision and rollator to day room rehab gym, ~134ft. Cues for increasing R hip/knee flexion in swing and heel strike on initial contact. Patient with a LOB while turning to sit on the Nustep needing minA for correction.   She completed 5 minutes of Nustep with BLE only at resistance 7 and then 5 minutes of Nustep with BUE/BLE with resistance set to 7. Cues for activiating hip abductors to keep R knee in neutral alignment, keeping cadence > 40 spm, and full AROM bilaterally. Completed a total of 487 steps.  Worked on non-linear gait with cone weaving task using the rollator and CGA for safety - patient with narrow BOS - decreased hip abd on R, and increased instability with turns. Pt with slow, cautious gait, and somewhat fearful of falling with this task. Pt upset with herself with difficulty of a novel task.   Pt challenged in R hip strengthening - completed isometric hip abd into physioball against the wall with BUE support - 1x10 with 5 second holds.   She then completed single leg stance on dynadisc with BUE support on rollator with CGA for safety. 1x10 with 4 second holds.    Instructed in lateral step overs with 4# ankle weight on RLE over 4 block and then 5 block with BUE support to RW - 2x10 each.   Ambulated back to her room with CGA and rollator - continued emphasis on R hip/knee flexion and heel strike for initial contact. Pt requesting to rest in bed before next therapy session. Able to reposition self with cues. All needs met   2nd session: Pt sitting in wheelchair and agreeable to therapy. Reports she was able to get a quick nap before this PT session and feels more rested. Sit<>stand to rollator with CGA. Ambulates with CGA and rollator to main rehab gym, ~147ft - continued cues as above for increasing R heel strike and hip.knee flexion in swing.   In // bars, continued to work on R hip NMR for strengthening and proprioceptive feedback:  Access Code: [email or web address] URL: [email or web address] Date: 11/04/2023 Prepared by: Sherlean Perks  Exercises - Side Stepping with Resistance at Feet  2 sets - 10 reps - Forward and Backward Monster Walk with Resistance at Ankles and Counter Support  - 2 sets - 10 reps - Step Up with Medial Resistance  - 23 sets - 10 reps - Lunge with Anchored Medial Resistance  - 2 sets - 10 reps - Hip and Knee Flexion with Anchored Resistance  - 2 sets - 10 reps *mirror used for visual feedback and // bars used for BUE support for all exercises.  Worked on endurance and activity tolerance with SciFit UE ergometer in standing position with CGA for balance - completed 2x5 minutes without a rest break with L5.0 resistance. Seated rest break before ambulating back to her room ~245ft with CGA and rollator. Noted improved stride length, gait speed, and foot clearance on RLE towards end of session.   Pt ending treatment sitting upright in wheelchair with her needs met.     Therapy Documentation Precautions:  Precautions Precautions: Fall Precaution Comments: Rt hemiparesis Restrictions Weight Bearing Restrictions  Per Provider Order: No General:   Vital Signs: Therapy Vitals Temp: 97.6 F (36.4 C) Pulse Rate: 62 Resp: 17 BP: 124/63 Patient Position (if appropriate): Lying Oxygen Therapy SpO2: 97 % Pain:   Mobility:   Locomotion :    Trunk/Postural Assessment :    Balance:   Exercises:   Other Treatments:      Therapy/Group: Individual Therapy  Sherlean SHAUNNA Perks 11/04/2023, 7:48 AM

## 2023-11-04 NOTE — Progress Notes (Signed)
 PROGRESS NOTE   Subjective/Complaints: No new complaints this morning Ambulating with [facility] Appreciate SW update No issues overnight  ROS: +feels cold at 3am, +right sided weakness, +itchiness left lateral side.  Denies CP/SOB/abdominal pain + L rib wall pain-improved  Objective:   No results found. Recent Labs    11/03/23 0521  WBC 9.7  HGB 12.7  HCT 37.9  PLT 326    Recent Labs    11/03/23 0521  NA 133*  K 4.4  CL 101  CO2 24  GLUCOSE 96  BUN 15  CREATININE 0.80  CALCIUM 9.2     Intake/Output Summary (Last 24 hours) at 11/04/2023 1417 Last data filed at 11/04/2023 1300 Gross per 24 hour  Intake 960 ml  Output --  Net 960 ml        Physical Exam: Vital Signs Blood pressure 124/63, pulse 62, temperature 97.6 F (36.4 C), resp. rate 17, height 5' 8 (1.727 m), weight 67.7 kg, SpO2 97%. Gen: no distress, normal appearing, sitting in chair HEENT: oral mucosa pink and moist, NCAT Cardio: Reg rate Chest: normal effort, normal rate of breathing Abd: soft, non-distended Ext: no edema, small pointing area of her proximal toenail great toe Psych: pleasant, normal affect Musculoskeletal:        General: No swelling or tenderness. Normal range of motion.     Cervical back: Normal range of motion.  Skin:    General: Warm and dry Neurological:     Mental Status: She is alert.     Comments: Alert and oriented x 3. Normal insight and awareness. Intact Memory. Normal language with mild dysarthria. Cranial nerve exam unremarkable except for right central VII and right hemifacial sensory loss. MMT: RUE is 3 to 3+/5 prox to distal. RLE 3+/5 prox to 4/5 distal. LUE and LLE 4+ to 5/5. Sensory exam notable for 1/2 to LT and pain in right arm and leg. Decreased FMC of right side with PD. No frank ataxia.  No abnl resting tone. DTR's 1+. Stable 11/04/23 Psychiatric:        Mood and Affect: Very pleasant     Behavior: Behavior normal.    Assessment/Plan: 1. Functional deficits which require 3+ hours per day of interdisciplinary therapy in a comprehensive inpatient rehab setting. Physiatrist is providing close team supervision and 24 hour management of active medical problems listed below. Physiatrist and rehab team continue to assess barriers to discharge/monitor patient progress toward functional and medical goals  Care Tool:  Bathing    Body parts bathed by patient: Right arm, Left arm, Chest, Abdomen, Front perineal area, Right upper leg, Left upper leg, Face, Right lower leg, Left lower leg, Buttocks   Body parts bathed by helper: Buttocks, Right lower leg, Left lower leg     Bathing assist Assist Level: Minimal Assistance - Patient > 75%     Upper Body Dressing/Undressing Upper body dressing   What is the patient wearing?: Pull over shirt    Upper body assist Assist Level: Supervision/Verbal cueing    Lower Body Dressing/Undressing Lower body dressing      What is the patient wearing?: Underwear/pull up, Pants     Lower body assist  Assist for lower body dressing: Minimal Assistance - Patient > 75%     Toileting Toileting    Toileting assist Assist for toileting: Minimal Assistance - Patient > 75%     Transfers Chair/bed transfer  Transfers assist     Chair/bed transfer assist level: Minimal Assistance - Patient > 75%     Locomotion Ambulation   Ambulation assist      Assist level: Moderate Assistance - Patient 50 - 74% Assistive device: No Device Max distance: 76ft   Walk 10 feet activity   Assist     Assist level: Moderate Assistance - Patient - 50 - 74% Assistive device: No Device   Walk 50 feet activity   Assist Walk 50 feet with 2 turns activity did not occur: Safety/medical concerns (RLE weakness)         Walk 150 feet activity   Assist Walk 150 feet activity did not occur: Safety/medical concerns         Walk 10 feet on  uneven surface  activity   Assist Walk 10 feet on uneven surfaces activity did not occur: Safety/medical concerns         Wheelchair     Assist Is the patient using a wheelchair?: Yes Type of Wheelchair: Manual    Wheelchair assist level: Total Assistance - Patient < 25% Max wheelchair distance: 39ft    Wheelchair 50 feet with 2 turns activity    Assist        Assist Level: Total Assistance - Patient < 25%   Wheelchair 150 feet activity     Assist      Assist Level: Total Assistance - Patient < 25%   Blood pressure 124/63, pulse 62, temperature 97.6 F (36.4 C), resp. rate 17, height 5' 8 (1.727 m), weight 67.7 kg, SpO2 97%.  Medical Problem List and Plan: 1. 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              -patient may  shower             -ELOS/Goals: 12-16 days, mod I goals             -she is having some waxing and waning sensory symptoms particularly in RLE. Discussed characteristics of these, particularly after thalamic stroke. Reassured her that it wasn't the sign of a progressive infarct  Team conference held 1/8  Grounds pass ordered  Continue CIR 2.  Antithrombotics: -DVT/anticoagulation:  Pharmaceutical: Lovenox              -antiplatelet therapy: plavix  and 81mg  asa for 3 weeks then asa alone.  3. Pain Management: Tylenol  prn.  4. Insomnia: well controlled, d/c melatonin 5. Neuropsych/cognition: This patient is capable of making decisions on her own behalf. 6. Skin/Wound Care: Routine pressure relief measures.   7. Hyponatremia: stabilized, d/c labs  Discussed adding pinch of salt to water in morning  8. Blood pressure: well controlled     11/04/2023    4:52 AM 11/03/2023    7:44 PM 11/03/2023    1:05 PM  Vitals with BMI  Systolic 124 112 884  Diastolic 63 64 71  Pulse 62 80 72     9. Hypercholesterolemia: LDL-196. continue Zetia  and Leqvio pending approval.  Discussed that she had prior negative side effects to statin.  10. Hyperglycemia: discussed that CBGs are lwell controlled can d/c CBG checks  11. Low protein: educated patient regarding levels, Ensure ordered q24H prn  12. Vitamin D  insufficiency: D3 ordered, increase to 3,000U daily  13. Constipation: d/c bisacodyl  suppository.  14.  Left rib area pain.  Slight redness noted.  Concern for shingles  -continue Valtrex  1g TID, discussed with pharmacy- appreciate assistance   15. Polypharmacy: d/c lovenox  given improved ambulation   LOS: 7 days A FACE TO FACE EVALUATION WAS PERFORMED  Sven P Temari Schooler 11/04/2023, 2:17 PM

## 2023-11-04 NOTE — Progress Notes (Signed)
 Occupational Therapy Session Note  Patient Details  Name: Jordan Collier MRN: [id number] Date of Birth: [birth year]  Today's Date: 11/04/2023 OT Individual Time: 8693-8584 OT Individual Time Calculation (min): 69 min    Short Term Goals: Week 1:  OT Short Term Goal 1 (Week 1): Pt will complete sit > stand in prep for ADL with CGA using LRAD OT Short Term Goal 2 (Week 1): Pt will complete 2/3 toileting steps with min A for dynamic balance OT Short Term Goal 3 (Week 1): Pt will complete toilet transfer with CGA using LRAD  Skilled Therapeutic Interventions/Progress Updates:     Pt received sitting up in recliner, dressed and ready for the day politely declining need for ADLs this session. Pt presenting to be in good spirits receptive to skilled OT session reporting 0/10 pain- OT offering intermittent rest breaks, repositioning, and therapeutic support to optimize participation in therapy session. Focus this session IADL retraining in preparation for d/c home to ILF. Began session by providing education on fall prevention, energy conservation, and CVA recovery with Pt receptive to education and verbalizing understanding. Education provided on rollator use with demonstration and verbal instructions provided on safety considerations with rollator use and recalling need to lock breaks prior to sit<>stands and when standing with rollator to increase safety. Engaged Pt in series of IADL tasks using rollator to facilitate increased practice opportunities to implement energy conservation techniques during IADLs and practice using rollator to increase safety during IADLs. Began by having Pt make her bed including donning clean sheets and moderate weighted blanket onto bed. Demonstrated technique for sitting on rollator for part of task and postioning for rollator when standing. Set-up environment to simulate Pt's home with Pt able to make the bed with CGA to light min A provided for balance and mod verbal  cues safety when ambulating around the bed and rollator use. In therapy gym, Pt then completed simulated laundry tasks with environment set-up to simulate home stacked washer and drier. Education provided on naval architect on rollator and using rollator to transport items from one location to the next with Pt demonstrating teach back as evidence of learning. Pt able to retrieve from top loading washer and transport items in laundry basket using rollator with CGA provided overall and mod verbal cues for safety with rollator use d/t new learning experience with rollator. Pt chose to sit on mat to fold towels for energy conservation demonstrating good learning of technique. Session then progressed toward working on medication management with Pt instructed to organize 5 medications into weekly pill box to simulate organizing her personal medications for the week. Education provided on importance of medication adherence to avoid adverse side effects and on purpose of pill bock organizer with Pt receptive to education. Pt able to organize 4/5 medications with 100% accuracy and correct mistakes with min questioning cues. Pt demonstrating good understanding of pill box use and appropriate problem solving and attention skills to manage medications independently at home. Transported Pt back to room total A in wc for energy conservation d/t fatigue.Pt was left resting in wc with call bell in reach and all needs met.    Therapy Documentation Precautions:  Precautions Precautions: Fall Precaution Comments: Rt hemiparesis Restrictions Weight Bearing Restrictions Per Provider Order: No   Therapy/Group: Individual Therapy  Jordan Collier 11/04/2023, 2:00 PM

## 2023-11-04 NOTE — Progress Notes (Signed)
 Patient ID: Jordan Collier, female   DOB: Apr 25, 1946, 78 y.o.   MRN: [id number] Spoke with Geannie learn admission coordinator at [facility] for the rehab part. Made aware pt plans to come to the rehab then transition back to her independent apartment when discharged from here. Will update her tomorrow after team conference regarding target discharge date.

## 2023-11-04 NOTE — Progress Notes (Signed)
 Occupational Therapy Session Note  Patient Details  Name: Jordan Collier MRN: [id number] Date of Birth: 1946-08-02  Today's Date: 11/04/2023 OT Individual Time: 9166-9099 OT Individual Time Calculation (min): 27 min    Short Term Goals: Week 1:  OT Short Term Goal 1 (Week 1): Pt will complete sit > stand in prep for ADL with CGA using LRAD OT Short Term Goal 2 (Week 1): Pt will complete 2/3 toileting steps with min A for dynamic balance OT Short Term Goal 3 (Week 1): Pt will complete toilet transfer with CGA using LRAD   Skilled Therapeutic Interventions/Progress Updates:    1:1 Pt received in the w/c. Focus on self care retraining at shower level. Pt ambulated into the bathroom with contact guard with RW with cues for stepping through with right LE. Pt able to shower sit to stand with contact guard. Pt did require min A for standing balance while washing periarea and buttocks - pt with strong lean to the right. Pt able to dry and dress sit to stand with contact guard. Pt left sitting up in w/c in prep for next session.   Therapy Documentation Precautions:  Precautions Precautions: Fall Precaution Comments: Rt hemiparesis Restrictions Weight Bearing Restrictions Per Provider Order: No  Pain: Pain Assessment Pain Scale: 0-10 Pain Score: 0-No pain    Therapy/Group: Individual Therapy  Claudene Nest [facility] 11/04/2023, 11:19 AM

## 2023-11-05 DIAGNOSIS — I6381 Other cerebral infarction due to occlusion or stenosis of small artery: Secondary | ICD-10-CM | POA: Diagnosis not present

## 2023-11-05 MED ORDER — VITAMIN D 25 MCG (1000 UNIT) PO TABS
3000.0000 [IU] | ORAL_TABLET | Freq: Every day | ORAL | Status: DC
  Administered 2023-11-06 – 2023-11-12 (×7): 3000 [IU] via ORAL
  Filled 2023-11-05 (×7): qty 3

## 2023-11-05 NOTE — Progress Notes (Addendum)
 Patient ID: Jordan Collier, female   DOB: [birth year], 78 y.o.   MRN: [id number]  Have faxed her disability forms to [facility] fro [place]. Pt aware and once completed will give her the original forms  2:41 PM Have met pt to discuss team conference goals of mod/I if stays until 1/24 and not go to the rehab part. Pt will need to decide if going to rehab at discharge or back to her independent apartment. She seems to feel someone else decides this. Will call Cindi-Admissions at [facility] and see if issue with them and they request back to rehab then transition back to apartment.

## 2023-11-05 NOTE — Progress Notes (Signed)
 PROGRESS NOTE   Subjective/Complaints: No new complaints this morning Asks if she can used her eye drops from home for her dry eyes post- cataract surgery, discussed that she can  ROS: +feels cold at 3am, +right sided weakness, +itchiness left lateral side.  Denies CP/SOB/abdominal pain + L rib wall pain-improved, +dry eyes  Objective:   No results found. Recent Labs    11/03/23 0521  WBC 9.7  HGB 12.7  HCT 37.9  PLT 326    Recent Labs    11/03/23 0521 11/04/23 1706  NA 133*  --   K 4.4  --   CL 101  --   CO2 24  --   GLUCOSE 96 104*  BUN 15  --   CREATININE 0.80  --   CALCIUM 9.2  --      Intake/Output Summary (Last 24 hours) at 11/05/2023 1821 Last data filed at 11/05/2023 1750 Gross per 24 hour  Intake 956 ml  Output --  Net 956 ml        Physical Exam: Vital Signs Blood pressure (!) 117/59, pulse 84, temperature 98.2 F (36.8 C), resp. rate 18, height 5\' 8"  (1.727 m), weight 67.7 kg, SpO2 100%. Gen: no distress, normal appearing, sitting in chair HEENT: oral mucosa pink and moist, NCAT Cardio: Reg rate Chest: normal effort, normal rate of breathing Abd: soft, non-distended Ext: no edema, small pointing area of her proximal toenail great toe Psych: pleasant, normal affect Musculoskeletal:        General: No swelling or tenderness. Normal range of motion.     Cervical back: Normal range of motion.  Skin:    General: Warm and dry Neurological:     Mental Status: She is alert.     Comments: Alert and oriented x 3. Normal insight and awareness. Intact Memory. Normal language with mild dysarthria. Cranial nerve exam unremarkable except for right central VII and right hemifacial sensory loss. MMT: RUE is 4/5 prox to distal. RLE 3+/5 prox to 4/5 distal. LUE and LLE 4+ to 5/5. Sensory exam notable for 1/2 to LT and pain in right arm and leg. Decreased FMC of right side with PD. No frank ataxia.  No abnl  resting tone. DTR's 1+. Improved 1/15 Psychiatric:        Mood and Affect: Very pleasant    Behavior: Behavior normal.    Assessment/Plan: 1. Functional deficits which require 3+ hours per day of interdisciplinary therapy in a comprehensive inpatient rehab setting. Physiatrist is providing close team supervision and 24 hour management of active medical problems listed below. Physiatrist and rehab team continue to assess barriers to discharge/monitor patient progress toward functional and medical goals  Care Tool:  Bathing    Body parts bathed by patient: Right arm, Left arm, Chest, Abdomen, Front perineal area, Right upper leg, Left upper leg, Face, Right lower leg, Left lower leg, Buttocks   Body parts bathed by helper: Buttocks, Right lower leg, Left lower leg     Bathing assist Assist Level: Contact Guard/Touching assist     Upper Body Dressing/Undressing Upper body dressing   What is the patient wearing?: Pull over shirt    Upper body  assist Assist Level: Supervision/Verbal cueing    Lower Body Dressing/Undressing Lower body dressing      What is the patient wearing?: Underwear/pull up, Pants     Lower body assist Assist for lower body dressing: Contact Guard/Touching assist     Toileting Toileting    Toileting assist Assist for toileting: Minimal Assistance - Patient > 75%     Transfers Chair/bed transfer  Transfers assist     Chair/bed transfer assist level: Contact Guard/Touching assist     Locomotion Ambulation   Ambulation assist      Assist level: Moderate Assistance - Patient 50 - 74% Assistive device: No Device Max distance: 69ft   Walk 10 feet activity   Assist     Assist level: Moderate Assistance - Patient - 50 - 74% Assistive device: No Device   Walk 50 feet activity   Assist Walk 50 feet with 2 turns activity did not occur: Safety/medical concerns (RLE weakness)         Walk 150 feet activity   Assist Walk 150  feet activity did not occur: Safety/medical concerns         Walk 10 feet on uneven surface  activity   Assist Walk 10 feet on uneven surfaces activity did not occur: Safety/medical concerns         Wheelchair     Assist Is the patient using a wheelchair?: Yes Type of Wheelchair: Manual    Wheelchair assist level: Total Assistance - Patient < 25% Max wheelchair distance: 27ft    Wheelchair 50 feet with 2 turns activity    Assist        Assist Level: Total Assistance - Patient < 25%   Wheelchair 150 feet activity     Assist      Assist Level: Total Assistance - Patient < 25%   Blood pressure (!) 117/59, pulse 84, temperature 98.2 F (36.8 C), resp. rate 18, height 5\' 8"  (1.727 m), weight 67.7 kg, SpO2 100%.  Medical Problem List and Plan: 1. 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              -patient may  shower             -ELOS/Goals: 12-16 days, mod I goals             -she is having some waxing and waning sensory symptoms particularly in RLE. Discussed characteristics of these, particularly after thalamic stroke. Reassured her that it wasn't the sign of a progressive infarct  Team conference held 1/8  Grounds pass ordered  Continue CIR 2.  Antithrombotics: D/c lovenox  since ambulating >150 feet             -antiplatelet therapy: plavix  and 81mg  asa for 3 weeks then asa alone.  3. Pain Management: Tylenol  prn.  4. Insomnia: well controlled, d/c melatonin 5. Neuropsych/cognition: This patient is capable of making decisions on her own behalf. 6. Skin/Wound Care: Routine pressure relief measures.   7. Hyponatremia: stabilized, d/c labs  Discussed adding pinch of salt to water in morning  8. Blood pressure: well controlled     11/05/2023    1:10 PM 11/05/2023    4:55 AM 11/04/2023    8:15 PM  Vitals with BMI  Systolic 117 122 161  Diastolic 59 65 58  Pulse 84 72 78      9. Hypercholesterolemia: LDL-196. continue Zetia  and Leqvio pending approval. Discussed that she had prior negative  side effects to statin.  10. Hyperglycemia: discussed that CBGs are lwell controlled can d/c CBG checks  11. Low protein: educated patient regarding levels, Ensure ordered q24H prn   12. Vitamin D  insufficiency: D3 ordered, increase to 3,000U daily  13. Constipation: d/c bisacodyl  suppository.  14.  Left rib area pain.  Slight redness noted.  Concern for shingles  -continue Valtrex  1g TID, discussed with pharmacy- appreciate assistance, discussed stop date of Friday  15. Polypharmacy: d/c lovenox  given improved ambulation, d/c prn tylenol  since denies pain  16. Dry eyes: placed order that she may use her home eye drops   LOS: 8 days A FACE TO FACE EVALUATION WAS PERFORMED  Jordan Collier 11/05/2023, 6:21 PM

## 2023-11-05 NOTE — Progress Notes (Signed)
 Occupational Therapy Weekly Progress Note  Patient Details  Name: Jordan Collier MRN: [id number] Date of Birth: 1946/03/13  Beginning of progress report period: October 29, 2023 End of progress report period: November 05, 2023  Today's Date: 11/05/2023 OT Individual Time: 4098-1191 and 4782-9562 OT Individual Time Calculation (min): 30 min and 57 min   Patient has met 3 of 3 short term goals.  Patient is highly motivated and very hard working.  Patient now hopes to discharge back to her independent living apartment with hired services for driving for shopping.    Patient continues to demonstrate the following deficits: abnormal tone, unbalanced muscle activation, and decreased coordination and decreased standing balance, hemiplegia, and decreased balance strategies and therefore will continue to benefit from skilled OT intervention to enhance overall performance with BADL, iADL, and Reduce care partner burden.  Patient progressing toward long term goals..  Continue plan of care.  OT Short Term Goals Week 1:  OT Short Term Goal 1 (Week 1): Pt will complete sit > stand in prep for ADL with CGA using LRAD OT Short Term Goal 1 - Progress (Week 1): Met OT Short Term Goal 2 (Week 1): Pt will complete 2/3 toileting steps with min A for dynamic balance OT Short Term Goal 2 - Progress (Week 1): Met OT Short Term Goal 3 (Week 1): Pt will complete toilet transfer with CGA using LRAD OT Short Term Goal 3 - Progress (Week 1): Met Week 2:  OT Short Term Goal 1 (Week 2): STG=LTG due to LOS  Skilled Therapeutic Interventions/Progress Updates:    AM Session:  Patient received seated in wheelchair eager for shower.  Walked to shower with rollator and close supervision/ cueing.  Patient able to remove some clothing items in standing - dynamic stand balance - and use good judgement when needing to sit down.  Patient showing improved activity tolerance, improve alignment and balance in standing.  Still  needing cues to slow pace, especially in crowded environments.  Left up in wheelchair in room with personal items in reach - awaiting lunch tray.    PM Session:  Patient received returning from bathroom as direct hand off from NT.  Patient walked to therapy gym with verbal cueing regarding posture - chest lifted, shoulders down, encouraging decreased pressure on hands on rollator.  Patient very responsive to cueing.  Right foot dragging with increased speed or fatigue.  Worked on shower adaptations needed for her apartment.  Saw pictures of friend's shower - similar set up, and practiced safe shower transfers.  Worked to determine where grab bars needed, modifying height of hand held shower head, where seat needed, and shelving for toiletries.  Patient pleased with process, and plans to call [facility] to inform of requested changes.   Worked on dynamic stand balance - sit to stand and stand to sit without use of Ue's, stand step laterally and backward.  Walked back to room and patient eager for final therapy session of the day.  Left up in wheelchair with call bell and personal items in reach.     Therapy Documentation Precautions:  Precautions Precautions: Fall Precaution Comments: Rt hemiparesis Restrictions Weight Bearing Restrictions Per Provider Order: No   Am session:   Pain:  Denies pain  PM Session:   Pain:  Denies pain    Therapy/Group: Individual Therapy  Etta Gassett M 11/05/2023, 12:55 PM

## 2023-11-05 NOTE — Plan of Care (Signed)
  Problem: Consults Goal: RH STROKE PATIENT EDUCATION Description: See Patient Education module for education specifics  Outcome: Progressing   Problem: RH SAFETY Goal: RH STG ADHERE TO SAFETY PRECAUTIONS W/ASSISTANCE/DEVICE Description: STG Adhere to Safety Precautions With cues Assistance/Device. Outcome: Progressing   Problem: RH KNOWLEDGE DEFICIT Goal: RH STG INCREASE KNOWLEDGE OF HYPERTENSION Description: Patient will be able to manage HTN using educational resources for medications,  and diet modification independently Outcome: Progressing Goal: RH STG INCREASE KNOWLEGDE OF HYPERLIPIDEMIA Description: Patient will be able to manage HLD using educational resources for medications,  and diet modification independently Outcome: Progressing Goal: RH STG INCREASE KNOWLEDGE OF STROKE PROPHYLAXIS Description: Patient will be able to manage secondary risks using educational resources for medications,  and diet modification independently Outcome: Progressing

## 2023-11-05 NOTE — Progress Notes (Signed)
 Physical Therapy Weekly Progress Note  Patient Details  Name: Jordan Collier MRN: [id number] Date of Birth: 01-Dec-1945  Beginning of progress report period: October 29, 2023 End of progress report period: November 05, 2023  Patient has met 4 of 4 short term goals.  [name] is making steady progress towards LTG of supervision. Monitoring for possibility of upgrading goals to mod I pending progress with patient goals to return to ILF rather than short term SNF. Overall, she's supervision for bed mobility, CGA for stand pivot transfers, and is ambulating >134ft with CGA using the rollator. She is primarily limited by R sided weakness, impaired dynamic standing balance, and decreased activity tolerance. She is highly motivated, works hard daily in therapies, and is determined to return to prior level of function.   Patient continues to demonstrate the following deficits muscle weakness and muscle joint tightness, decreased cardiorespiratoy endurance, unbalanced muscle activation and decreased coordination, and decreased standing balance, decreased postural control, hemiplegia, and decreased balance strategies and therefore will continue to benefit from skilled PT intervention to increase functional independence with mobility.  Patient progressing toward long term goals..  Continue plan of care.  PT Short Term Goals Week 1:  PT Short Term Goal 1 (Week 1): Pt will complete bed mobility with CGA PT Short Term Goal 1 - Progress (Week 1): Met PT Short Term Goal 2 (Week 1): Pt will complete bed<>chair transfers with minA and LRAD PT Short Term Goal 2 - Progress (Week 1): Met PT Short Term Goal 3 (Week 1): Pt will ambulate at least 156ft with minA and LRAD PT Short Term Goal 3 - Progress (Week 1): Met PT Short Term Goal 4 (Week 1): Pt will improve BERG balance score by at least 8 points PT Short Term Goal 4 - Progress (Week 1): Met Week 2:  PT Short Term Goal 1 (Week 2): Pt will complete bed  mobility with supervision PT Short Term Goal 2 (Week 2): Pt will complete bed<>chair transfers with supervision and LRAD PT Short Term Goal 3 (Week 2): Pt will ambulate 26ft with CGA and LRAD PT Short Term Goal 4 (Week 2): Pt will improve BERG balance score by at least 8 points  Therapy Documentation Precautions:  Precautions Precautions: Fall Precaution Comments: Rt hemiparesis Restrictions Weight Bearing Restrictions Per Provider Order: No General:     [name] P Fabrice Dyal  PT, DPT, CSRS  11/05/2023, 7:56 AM

## 2023-11-05 NOTE — Progress Notes (Signed)
 Physical Therapy Session Note  Patient Details  Name: Jordan Collier MRN: [id number] Date of Birth: 11-Aug-1946  Today's Date: 11/05/2023 PT Individual Time: 4098-1191 + 1430-1530 PT Individual Time Calculation (min): 70 min  + 60 min  Short Term Goals: Week 1:  PT Short Term Goal 1 (Week 1): Pt will complete bed mobility with CGA PT Short Term Goal 2 (Week 1): Pt will complete bed<>chair transfers with minA and LRAD PT Short Term Goal 3 (Week 1): Pt will ambulate at least 138ft with minA and LRAD PT Short Term Goal 4 (Week 1): Pt will improve BERG balance score by at least 8 points  Skilled Therapeutic Interventions/Progress Updates:      1st session: Pt sitting in wheelchair to start - has no c/o pain and ready for therapy. Sit<>stand to rollator with CGA - cues for brake locking only.   Ambulated with close supervision and rollator from her room to day room rehab gym, ~126ft - cues for R heel strike and R hip/knee flexion in swing. Intermittent toe drag when she becomes distracted.   Pt assisted onto mat table with supervision assist. Completed BLE stretching for muscle lengthening - hamstring in long sitting and single knee to chest in supine, supine piriformis stretch with foot on ground. 3x30 second stretch's bilaterally.   Pt also completed standing stretching (reports she does a standing stretch routine daily to help with rib mobility and lower back stretching). She completed reaching for toes in standing, trunk twists, overhead reaching, and standing single leg quad stretch with UE support. CGA for all balance stretching.   Supine NMR for strengthening: -2x12 bridges with yellow TB resistance around hips  BERG balance test completed with results listed below. Intermittent seated rest breaks for testing. Pt with a 22 point improvement since date of evaluation!  Patient demonstrates increased fall risk as noted by score of   31/56 on Berg Balance Scale.  (<36= high risk for  falls, close to 100%; 37-45 significant >80%; 46-51 moderate >50%; 52-55 lower >25%)  Stair egress testing using 6" steps -3x4 6" steps with 2 hand rails. CGA with self selecting reciprocal stepping -3x4 6" steps with 1 hand rail. CGA with self selecting reciprocal stepping -3x4 6" steps with - hand rail. minA for step-to pattern for both directions. Difficulty with adequate hip/knee flexion on R to clear step without UE support.  Pt returned to her room, ambulating with supervision assist and rollator. Left sitting upright in wheelchair with all her needs met.  2nd session: Pt lying in bed resting - awakens to voice and agreeable to therapy. Has no c/o pain. Bed mobility completed with supervision. Completes sit<>Stand to rollator with cues for brake management. Ambulates with supervision and rollator from her room to main rehab gym, ~151ft - cues for R foot awareness and continuing to work on R heel strike and hip/knee flexion to improve foot clearance.  Session focused on NMR for standing balance.  -firm ground static standing with feet together eyes open --firm ground static standing with feet apart eyes open -firm ground static standing with feet in semi tandem eyes open -compliant surface feet together eyes open -compliant surface feet apart eyes open -compliant surface feet in semi tandem eyes open -compliant surface feet together with head turns L<>R and up/down *CGA to min guard for standing balance - persistent LOB to the R with head turns and with semi tandem surface. Increased difficulty with compliant > firm surface standing.  -compliant surface feet apart  holding 3# dowel rod 3x10 chest press -compliant surface feet apart holding 3# dowel rod 3x10 shoulder press -compliant surface feet apart holding 3# dowel rod & perturbations to dowel rod --compliant surface tandem forward/backward ambulation with vs without EU support -compliant surface lateral stepping with vs without UE  support *Pt relies heavily on UE support for balance with all these activities.   Pt ambulated back to her room at supervision level using the rollator - cues as mentioned above. Left sitting upright with all her needs met.    Therapy Documentation Precautions:  Precautions Precautions: Fall Precaution Comments: Rt hemiparesis Restrictions Weight Bearing Restrictions Per Provider Order: No General:    Balance: Balance Balance Assessed: Yes Standardized Balance Assessment Standardized Balance Assessment: Berg Balance Test Berg Balance Test Sit to Stand: Able to stand  independently using hands Standing Unsupported: Able to stand safely 2 minutes Sitting with Back Unsupported but Feet Supported on Floor or Stool: Able to sit safely and securely 2 minutes Stand to Sit: Controls descent by using hands Transfers: Needs one person to assist Standing Unsupported with Eyes Closed: Able to stand 10 seconds with supervision Standing Ubsupported with Feet Together: Needs help to attain position but able to stand for 30 seconds with feet together From Standing, Reach Forward with Outstretched Arm: Can reach forward >5 cm safely (2") From Standing Position, Pick up Object from Floor: Able to pick up shoe, needs supervision From Standing Position, Turn to Look Behind Over each Shoulder: Looks behind one side only/other side shows less weight shift Turn 360 Degrees: Needs close supervision or verbal cueing Standing Unsupported, Alternately Place Feet on Step/Stool: Able to complete >2 steps/needs minimal assist Standing Unsupported, One Foot in Front: Able to take small step independently and hold 30 seconds Standing on One Leg: Unable to try or needs assist to prevent fall Total Score: 31    Therapy/Group: Individual Therapy  Adreanna Fickel P Levan Aloia  PT, DPT, CSRS  11/05/2023, 7:55 AM

## 2023-11-06 DIAGNOSIS — I6381 Other cerebral infarction due to occlusion or stenosis of small artery: Secondary | ICD-10-CM | POA: Diagnosis not present

## 2023-11-06 NOTE — Patient Care Conference (Signed)
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 Sula Soda Social Worker Present: Dossie Der, LCSW Nurse Present: Chana Bode, RN;Ginelle Bays Trilby Drummer, RN PT Present: Wynelle Link, PT OT Present: Bretta Bang, OT SLP Present: Jeannie Done, SLP PPS Coordinator present : Edson Snowball, PT     Current Status/Progress Goal Weekly Team Focus  Bowel/Bladder   Patient continenet of bowel and bladder, last bowel movement 1/14.   Patient will remain continent.   Encourage timed toileting to remain continent.    Swallow/Nutrition/ Hydration               ADL's   Min assist BADL   Mod I ADL - badl/iadl   Balance, functional mobility, r hemiparesis    Mobility   supervision bed mobility, CGA stand pivot transfers, CGA ambulation with rollator 182ft, CGA/minA stair navigation   Supervision  higher level NMR for RLE, dynamic standing balance, DC planning, functional iADLs.    Communication                Safety/Cognition/ Behavioral Observations               Pain   Patient does not report pain.   Patient will remain pain free.   Encourage patient to verbalize when in pain.    Skin   Patient skin is intact.   Patients skin will remain intact.  Encourage frequent repositioning so skin will remain intact.      Discharge  Planning:  Pt plans to go to the rehab part of [facility] before returning to her independent apartment. Admissions at [facility] aware of this   Team Discussion: Thalamic stroke. Shingles. Patient wants to go to [facility] and then return to her apartment at [facility].  Will have to see if they will accept her with Shingles.  It so, she can discharge sooner rather than later.  Otherwise, if returning directly back to home then she would still need to continue her rehab here. Balance remains impaired with a BERG of 31/56. Therapy believes can reach MOD I goals. Super motivated.   Patient on target to meet rehab goals: yes, currently supervision and should reach MOD I goals by next week.  Discharge date of 11/14/2023  *See Care Plan and progress notes for long and short-term goals.   Revisions to Treatment Plan:  Added Valtrex.  Time toileting. Monitor labs and VS Teaching Needs: Medications, safety, self care, transfers, skin care, toileting, etc.  Current Barriers to Discharge: Discharge plan  Possible Resolutions to Barriers: Rehab at [facility] VS returning to apartment Order recommended DME if needed     Medical Summary Current Status: thalamic stroke, shingles  Barriers to Discharge: Medical stability  Barriers to Discharge Comments: thalamic stroke, shingles Possible Resolutions to [employer] Focus: continue aspirin, Zetia, Vitamin D,Plavix, continue valtrex  Continued Need for Acute Rehabilitation Level of Care: The patient requires daily medical management by a physician with specialized training in physical medicine and rehabilitation for the following reasons: Direction of a multidisciplinary physical rehabilitation program to maximize functional independence : Yes Medical management of patient stability for increased activity during participation in an intensive rehabilitation regime.: Yes Analysis of laboratory values and/or radiology reports  with any subsequent need for medication adjustment and/or medical intervention. : Yes   I attest that I was present, lead the team conference, and concur with the assessment and plan of the team.   Jearld Adjutant 11/06/2023, 11:25 AM

## 2023-11-06 NOTE — Progress Notes (Signed)
Physical Therapy Session Note  Patient Details  Name: Jordan Collier MRN: [id number] Date of Birth: 1946/09/27  Today's Date: 11/06/2023 PT Individual Time: 0950-1100 + 1415-1529 PT Individual Time Calculation (min): 70 min + 74 min  Short Term Goals: Week 2:  PT Short Term Goal 1 (Week 2): Pt will complete bed mobility with supervision PT Short Term Goal 2 (Week 2): Pt will complete bed<>chair transfers with supervision and LRAD PT Short Term Goal 3 (Week 2): Pt will ambulate 246ft with CGA and LRAD PT Short Term Goal 4 (Week 2): Pt will improve BERG balance score by at least 8 points  Skilled Therapeutic Interventions/Progress Updates:      1st sessioN: Pt sitting in wheelchair to start and agreeable to therapy treatment. Has no c/o pain.  Sit<>stand to rollator with CGA with cues for engaging brakes. Ambulates with supervision and rollator to main rehab gym, ~188ft - cues for R Heel strike and widening her BOS.   Completed her AM routine of stretching in standing position - trunk twists, lat stretches, overhead reaches, and bending down to her toe's to stretch her lower back and hamstrings.  Completed active warm up of repeated sit<>Stands from low mat table height - 2x10 with UE support and 1x10 without UE support.   Chop/lifts using 4.5# med ball in standing position - CGA for balance.  Higher level dynamic standing balance in // bars using large wooden rocker board in both frontal and sagital plane. Min guard and patient with persistent LOB to the R. Able to hold unsupported positions for up to ~5-10 seconds. Applied gentle perturbations to rocker board to upgrade challenge.   Standing plymometric in // bars for RLE NMR and balance/stepping stragies for balance recovery.  -quick forward/backward hopping in staggered stance with BUE support on // bar -quick lateral stepping (modified jumping jack) with BUE support on // bar  NMR gait training with no AD to focus on hip  stability, arm swing, and trunk stability - requires minA for gait with no AD due to R trunk lean and inconsistent step length. Difficulty coordinating forced arm swing so limited that.   Returned to her room and patient requesting to rest in bed. Bed mobility completed independently. Able to remove her tennis shoes without assist. All needs met at end.   2nd session: Pt greeted in room and in agreement to therapy treatment. No c/o pain.   Sit<>stand from rollator with CGA and turns with CGA to face rollator. Ambulates from her room using rollator with supervision assist to outside (standing rest break on elevator with CGA for safety as she manages the threshold). Pt tripping over throw mat at door entrance of hospital needing light minA for balance recovery - educated on fall prevention and being mindful of environment to reduce falls risk. Gait training outdoors on unlevel terrain with CGA and rollator - difficulty clearing her R foot when distracted or completed dual-cog conversation - terrain also impacts this as well. *  Introduced AFO (ottbok walkon) to facilitate improved heel strike and foot clearance in swing phase. Gait trial with rollator and AFO - patient continues to struggle with foot clearance even with AFO - likely coming more from insufficient hip/knee flexion > ankle DF weakness.   NMR for RLE strengthening for hamstring: -Standing hamstring curls with yellow TB resistance around feet and BUE support for balance -Prone hamstring curls with eyllow TB resistance around feet -Prone hamstring curls with 2.5# ankle weight aroud ankle -Prone eccentric hamstring  curls with 2.5# ankle weight around ankle.  -Supine heel slides with 2.5# ankle weight on RLE -Supine bridge (on heels to isolate hamstring > glut) *2x10 for each. PT cueing for muscle activiation, positioning, and sequencing for NMR.  Ambulated back to her room at supervision level using the rollator - concluded session seated  in wheelchair with all her needs met.    Therapy Documentation Precautions:  Precautions Precautions: Fall Precaution Comments: Rt hemiparesis Restrictions Weight Bearing Restrictions Per Provider Order: No General:    Therapy/Group: Individual Therapy  Rivky Clendenning P Evalee Gerard  PT, DPT, CSRS 11/06/2023, 7:48 AM

## 2023-11-06 NOTE — Plan of Care (Signed)
  Problem: Consults Goal: RH STROKE PATIENT EDUCATION Description: See Patient Education module for education specifics  Outcome: Progressing   Problem: RH SAFETY Goal: RH STG ADHERE TO SAFETY PRECAUTIONS W/ASSISTANCE/DEVICE Description: STG Adhere to Safety Precautions With cues Assistance/Device. Outcome: Progressing   Problem: RH KNOWLEDGE DEFICIT Goal: RH STG INCREASE KNOWLEDGE OF HYPERTENSION Description: Patient will be able to manage HTN using educational resources for medications,  and diet modification independently Outcome: Progressing Goal: RH STG INCREASE KNOWLEGDE OF HYPERLIPIDEMIA Description: Patient will be able to manage HLD using educational resources for medications,  and diet modification independently Outcome: Progressing Goal: RH STG INCREASE KNOWLEDGE OF STROKE PROPHYLAXIS Description: Patient will be able to manage secondary risks using educational resources for medications,  and diet modification independently Outcome: Progressing   Problem: RH KNOWLEDGE DEFICIT Goal: RH STG INCREASE KNOWLEGDE OF HYPERLIPIDEMIA Description: Patient will be able to manage HLD using educational resources for medications,  and diet modification independently Outcome: Progressing   Problem: RH KNOWLEDGE DEFICIT Goal: RH STG INCREASE KNOWLEDGE OF STROKE PROPHYLAXIS Description: Patient will be able to manage secondary risks using educational resources for medications,  and diet modification independently Outcome: Progressing

## 2023-11-06 NOTE — Progress Notes (Signed)
Occupational Therapy Session Note  Patient Details  Name: Jordan Collier MRN: [id number] Date of Birth: [birth year]  Today's Date: 11/06/2023 OT Individual Time: 4098-1191 OT Individual Time Calculation (min): 72 min    Short Term Goals: Week 1:  OT Short Term Goal 1 (Week 1): Pt will complete sit > stand in prep for ADL with CGA using LRAD OT Short Term Goal 1 - Progress (Week 1): Met OT Short Term Goal 2 (Week 1): Pt will complete 2/3 toileting steps with min A for dynamic balance OT Short Term Goal 2 - Progress (Week 1): Met OT Short Term Goal 3 (Week 1): Pt will complete toilet transfer with CGA using LRAD OT Short Term Goal 3 - Progress (Week 1): Met Week 2:  OT Short Term Goal 1 (Week 2): STG=LTG due to LOS  Skilled Therapeutic Interventions/Progress Updates:    Patient received seated at sink - very upset thsi am after having "worst night since I have been here."  Patient frustrated by series of events last night where she had a cup with a hole in it, spilled drink in her bed, had delayed response to calls, and was cold - too few blankets on bed. Service recovery employed.  Patient calm and very realistic about her goals and desire for improvement.  Walked to shower with cueing and rollator.  Showered seated with distant supervision.  Continue to work on walking short distances without walker - ie in bathroom.   Transported to gym for continued neuromuscular reeducation addressing RUE functioning/ strengthening through body on arm movements, as well as dynamic kneeling/ standing balance.  Returned to room and left up in wheelchair with call bell and personal items in reach.    Therapy Documentation Precautions:  Precautions Precautions: Fall Precaution Comments: Rt hemiparesis Restrictions Weight Bearing Restrictions Per Provider Order: No  Pain: Mild pain left lateral rib cage     Therapy/Group: Individual Therapy  [name] 11/06/2023, 9:53 AM

## 2023-11-06 NOTE — Progress Notes (Signed)
PROGRESS NOTE   Subjective/Complaints: No new complaints this morning Sleepy this morning, therapy notes reviewed and apparently she had a bad night due to delayed response to calls  ROS: +feels cold at 3am, +right sided weakness, +itchiness left lateral side.  Denies CP/SOB/abdominal pain + L rib wall pain-improved, +dry eyes, poor night due to delayed response to calls  Objective:   No results found. No results for input(s): "WBC", "HGB", "HCT", "PLT" in the last 72 hours.   Recent Labs    11/04/23 1706  GLUCOSE 104*     Intake/Output Summary (Last 24 hours) at 11/06/2023 1132 Last data filed at 11/05/2023 1750 Gross per 24 hour  Intake 480 ml  Output --  Net 480 ml        Physical Exam: Vital Signs Blood pressure (!) 125/59, pulse 64, temperature 97.9 F (36.6 C), temperature source Oral, resp. rate 18, height 5\' 8"  (1.727 m), weight 67.7 kg, SpO2 96%. Gen: no distress, normal appearing, sitting in chair HEENT: oral mucosa pink and moist, NCAT Cardio: Reg rate Chest: normal effort, normal rate of breathing Abd: soft, non-distended Ext: no edema, small pointing area of her proximal toenail great toe Psych: pleasant, normal affect Musculoskeletal:        General: No swelling or tenderness. Normal range of motion.     Cervical back: Normal range of motion.  Skin:    General: Warm and dry Neurological:     Mental Status: She is alert.     Comments: Alert and oriented x 3. Normal insight and awareness. Intact Memory. Normal language with mild dysarthria. Cranial nerve exam unremarkable except for right central VII and right hemifacial sensory loss. MMT: RUE is 4/5 prox to distal. RLE 3+/5 prox to 4/5 distal. LUE and LLE 4+ to 5/5. Sensory exam notable for 1/2 to LT and pain in right arm and leg. Decreased FMC of right side with PD. No frank ataxia.  No abnl resting tone. DTR's 1+. Stable 1/16 Psychiatric:         Mood and Affect: Very pleasant    Behavior: Behavior normal.    Assessment/Plan: 1. Functional deficits which require 3+ hours per day of interdisciplinary therapy in a comprehensive inpatient rehab setting. Physiatrist is providing close team supervision and 24 hour management of active medical problems listed below. Physiatrist and rehab team continue to assess barriers to discharge/monitor patient progress toward functional and medical goals  Care Tool:  Bathing    Body parts bathed by patient: Right arm, Left arm, Chest, Abdomen, Front perineal area, Right upper leg, Left upper leg, Face, Right lower leg, Left lower leg, Buttocks   Body parts bathed by helper: Buttocks, Right lower leg, Left lower leg     Bathing assist Assist Level: Contact Guard/Touching assist     Upper Body Dressing/Undressing Upper body dressing   What is the patient wearing?: Pull over shirt    Upper body assist Assist Level: Supervision/Verbal cueing    Lower Body Dressing/Undressing Lower body dressing      What is the patient wearing?: Underwear/pull up, Pants     Lower body assist Assist for lower body dressing: Contact Guard/Touching assist  Toileting Toileting    Toileting assist Assist for toileting: Minimal Assistance - Patient > 75%     Transfers Chair/bed transfer  Transfers assist     Chair/bed transfer assist level: Contact Guard/Touching assist     Locomotion Ambulation   Ambulation assist      Assist level: Moderate Assistance - Patient 50 - 74% Assistive device: No Device Max distance: 9ft   Walk 10 feet activity   Assist     Assist level: Moderate Assistance - Patient - 50 - 74% Assistive device: No Device   Walk 50 feet activity   Assist Walk 50 feet with 2 turns activity did not occur: Safety/medical concerns (RLE weakness)         Walk 150 feet activity   Assist Walk 150 feet activity did not occur: Safety/medical concerns          Walk 10 feet on uneven surface  activity   Assist Walk 10 feet on uneven surfaces activity did not occur: Safety/medical concerns         Wheelchair     Assist Is the patient using a wheelchair?: Yes Type of Wheelchair: Manual    Wheelchair assist level: Total Assistance - Patient < 25% Max wheelchair distance: 73ft    Wheelchair 50 feet with 2 turns activity    Assist        Assist Level: Total Assistance - Patient < 25%   Wheelchair 150 feet activity     Assist      Assist Level: Total Assistance - Patient < 25%   Blood pressure (!) 125/59, pulse 64, temperature 97.9 F (36.6 C), temperature source Oral, resp. rate 18, height 5\' 8"  (1.727 m), weight 67.7 kg, SpO2 96%.  Medical Problem List and Plan: 1. 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              -patient may  shower             -ELOS/Goals: 12-16 days, mod I goals             -she is having some waxing and waning sensory symptoms particularly in RLE. Discussed characteristics of these, particularly after thalamic stroke. Reassured her that it wasn't the sign of a progressive infarct  Team conference held 1/8  Grounds pass ordered  Continue CIR 2.  Antithrombotics: D/c lovenox since ambulating >150 feet             -antiplatelet therapy: plavix and 81mg  asa for 3 weeks then asa alone.  3. Pain Management: Tylenol prn.  4. Insomnia: well controlled, d/c melatonin 5. Neuropsych/cognition: This patient is capable of making decisions on her own behalf. 6. Skin/Wound Care: Routine pressure relief measures.   7. Hyponatremia: stabilized, d/c labs  Discussed adding pinch of salt to water in morning  8. Blood pressure: well controlled     11/06/2023    5:18 AM 11/05/2023    1:10 PM 11/05/2023    4:55 AM  Vitals with BMI  Systolic 125 117 315  Diastolic 59 59 65  Pulse 64 84 72     9. Hypercholesterolemia: LDL-196.  continue Zetia and Leqvio pending approval. Discussed that she had prior negative side effects to statin.  10. Hyperglycemia: discussed that CBGs are lwell controlled can d/c CBG checks  11. Low protein: educated patient regarding levels, Ensure ordered q24H prn   12. Vitamin D insufficiency: D3 ordered, continue 3,000U daily  13. Constipation:  d/c bisacodyl suppository.  14.  Left rib area pain.  Slight redness noted.  Concern for shingles  -continue Valtrex 1g TID, discussed with pharmacy- appreciate assistance, discussed stop date of Friday  15. Polypharmacy: d/c lovenox given improved ambulation, d/c prn tylenol since denies pain  16. Dry eyes: placed order that she may use her home eye drops   LOS: 9 days A FACE TO FACE EVALUATION WAS PERFORMED  Clint Bolder P Lurlean Kernen 11/06/2023, 11:32 AM

## 2023-11-07 DIAGNOSIS — I6381 Other cerebral infarction due to occlusion or stenosis of small artery: Secondary | ICD-10-CM | POA: Diagnosis not present

## 2023-11-07 NOTE — Progress Notes (Signed)
Occupational Therapy Session Note  Patient Details  Name: Jordan Collier MRN: [id number] Date of Birth: 20-Nov-1945  Today's Date: 11/07/2023 OT Individual Time: 1420-1530 OT Individual Time Calculation (min): 70 min    Short Term Goals: Week 2:  OT Short Term Goal 1 (Week 2): STG=LTG due to LOS  Skilled Therapeutic Interventions/Progress Updates:  Skilled OT intervention completed with focus on ADL retraining, functional endurance, and mobility within a shower context. Pt received seated in w/c, agreeable to session. No pain reported.  Pt requested to shower. Pt completed all sit > stands with/without AD with supervision and supervision/CGA ambulatory transfers with/without rollator during session.  Pt wheeled in w/c to retrieve all items for shower, ambulated > bathroom, doffed clothing in stance with CGA/supervision, with no LOB with intermittent support from rollator for balance. Ambulated > shower stall. Able to bathe all parts with set up A, at the sit > stand level while using grab bar for balance. Ambulated > BSC in bathroom with cues needed to dry off BLE/dress for fall prevention. Able to donn shirt/deo with set up A. Threaded LB clothing and socks/shoes with supervision at the sit > stand level without AD.  Ambulated to gym with 1 instance of poor Rt foot clearance with observed Rt hip weakness/fatigue and pt easily distracted by hall traffic, but no external assist to stabilize balance. Seated EOM pt completed the following for RUE NMR:  9 hole peg test -At first admission, Rt hand- 1 min 10 sec -Towards end of stay, Rt hand- 52 sec  Ring arc -transferring rings from Rt side of ring arc <> Lt side for proximal stability  Ambulated back to room, then pt remained seated in w/c, with chair alarm on/activated, and with all needs in reach at end of session.   Therapy Documentation Precautions:  Precautions Precautions: Fall Precaution Comments: Rt  hemiparesis Restrictions Weight Bearing Restrictions Per Provider Order: No    Therapy/Group: Individual Therapy  Melvyn Novas, MS, OTR/L  11/07/2023, 3:35 PM

## 2023-11-07 NOTE — Progress Notes (Signed)
PROGRESS NOTE   Subjective/Complaints: Discussed the negative experience she had with nursing the previous night She feels well today Discussed that valtrex will end tomorrow  ROS: +feels cold at 3am, +right sided weakness, +itchiness left lateral side.  Denies CP/SOB/abdominal pain + L rib wall pain-improved, +dry eyes, poor night due to delayed response to calls, +intermittent pain from shingles  Objective:   No results found. No results for input(s): "WBC", "HGB", "HCT", "PLT" in the last 72 hours.   Recent Labs    11/04/23 1706  GLUCOSE 104*     Intake/Output Summary (Last 24 hours) at 11/07/2023 1150 Last data filed at 11/07/2023 0742 Gross per 24 hour  Intake 940 ml  Output --  Net 940 ml        Physical Exam: Vital Signs Blood pressure (!) 119/52, pulse 72, temperature 97.8 F (36.6 C), temperature source Oral, resp. rate 16, height 5\' 8"  (1.727 m), weight 67.7 kg, SpO2 97%. Gen: no distress, normal appearing, sitting in chair HEENT: oral mucosa pink and moist, NCAT Cardio: Reg rate Chest: normal effort, normal rate of breathing Abd: soft, non-distended Ext: no edema, small pointing area of her proximal toenail great toe Psych: pleasant, normal affect Musculoskeletal:        General: No swelling or tenderness. Normal range of motion.     Cervical back: Normal range of motion.  Skin:    General: Warm and dry Neurological:     Mental Status: She is alert.     Comments: Alert and oriented x 3. Normal insight and awareness. Intact Memory. Normal language with mild dysarthria. Cranial nerve exam unremarkable except for right central VII and right hemifacial sensory loss. MMT: RUE is 4/5 prox to distal. RLE 3+/5 prox to 4/5 distal. LUE and LLE 4+ to 5/5. Sensory exam notable for 1/2 to LT and pain in right arm and leg. Decreased FMC of right side with PD. No frank ataxia.  No abnl resting tone. DTR's 1+.  Stable 1/17 Psychiatric:        Mood and Affect: Very pleasant    Behavior: Behavior normal.    Assessment/Plan: 1. Functional deficits which require 3+ hours per day of interdisciplinary therapy in a comprehensive inpatient rehab setting. Physiatrist is providing close team supervision and 24 hour management of active medical problems listed below. Physiatrist and rehab team continue to assess barriers to discharge/monitor patient progress toward functional and medical goals  Care Tool:  Bathing    Body parts bathed by patient: Right arm, Left arm, Chest, Abdomen, Front perineal area, Right upper leg, Left upper leg, Face, Right lower leg, Left lower leg, Buttocks   Body parts bathed by helper: Buttocks, Right lower leg, Left lower leg     Bathing assist Assist Level: Contact Guard/Touching assist     Upper Body Dressing/Undressing Upper body dressing   What is the patient wearing?: Pull over shirt    Upper body assist Assist Level: Supervision/Verbal cueing    Lower Body Dressing/Undressing Lower body dressing      What is the patient wearing?: Underwear/pull up, Pants     Lower body assist Assist for lower body dressing: Contact Guard/Touching assist  Toileting Toileting    Toileting assist Assist for toileting: Minimal Assistance - Patient > 75%     Transfers Chair/bed transfer  Transfers assist     Chair/bed transfer assist level: Contact Guard/Touching assist     Locomotion Ambulation   Ambulation assist      Assist level: Supervision/Verbal cueing Assistive device: Rollator Max distance: 200+   Walk 10 feet activity   Assist     Assist level: Supervision/Verbal cueing Assistive device: Rollator   Walk 50 feet activity   Assist Walk 50 feet with 2 turns activity did not occur: Safety/medical concerns (RLE weakness)  Assist level: Supervision/Verbal cueing Assistive device: Rollator    Walk 150 feet activity   Assist Walk  150 feet activity did not occur: Safety/medical concerns  Assist level: Supervision/Verbal cueing Assistive device: Rollator    Walk 10 feet on uneven surface  activity   Assist Walk 10 feet on uneven surfaces activity did not occur: Safety/medical concerns         Wheelchair     Assist Is the patient using a wheelchair?: Yes Type of Wheelchair: Manual    Wheelchair assist level: Total Assistance - Patient < 25% Max wheelchair distance: 56ft    Wheelchair 50 feet with 2 turns activity    Assist        Assist Level: Total Assistance - Patient < 25%   Wheelchair 150 feet activity     Assist      Assist Level: Total Assistance - Patient < 25%   Blood pressure (!) 119/52, pulse 72, temperature 97.8 F (36.6 C), temperature source Oral, resp. rate 16, height 5\' 8"  (1.727 m), weight 67.7 kg, SpO2 97%.  Medical Problem List and Plan: 1. 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              -patient may  shower             -ELOS/Goals: 12-16 days, mod I goals             -she is having some waxing and waning sensory symptoms particularly in RLE. Discussed characteristics of these, particularly after thalamic stroke. Reassured her that it wasn't the sign of a progressive infarct  Team conference held 1/8  Grounds pass ordered  Continue CIR  D/c to [facility] when bed is available  2.  Antithrombotics: D/c lovenox since ambulating >150 feet, encouraged her to move throughout the day for clot prevention             -antiplatelet therapy: plavix and 81mg  asa for 3 weeks then asa alone.   3. Pain Management: Tylenol prn.  4. Insomnia: well controlled, d/c melatonin  5. Neuropsych/cognition: This patient is capable of making decisions on her own behalf. 6. Skin/Wound Care: Routine pressure relief measures.   7. Hyponatremia: stabilized, d/c labs  Discussed adding pinch of salt to  water in morning  8. Blood pressure: well controlled     11/07/2023    3:40 AM 11/06/2023    8:01 PM 11/06/2023    1:39 PM  Vitals with BMI  Systolic 119 128 161  Diastolic 52 58 60  Pulse 72 78      9. Hypercholesterolemia: LDL-196. Continue Zetia and Leqvio pending approval. Discussed that she had prior negative side effects to statin.  10. Hyperglycemia: discussed that CBGs are lwell controlled can d/c CBG checks  11. Low protein: educated patient regarding levels, Ensure ordered  q24H prn   12. Vitamin D insufficiency: D3 ordered, continue 3,000U daily  13. Constipation: d/c bisacodyl suppository.  14.  Left rib area pain.  Slight redness noted.  Concern for shingles  -continue Valtrex 1g TID, discussed with pharmacy- appreciate assistance, discussed stop date of Friday  15. Polypharmacy: d/c lovenox given improved ambulation, d/c prn tylenol since denies pain  16. Dry eyes: placed order that she may use her home eye drops   LOS: 10 days A FACE TO FACE EVALUATION WAS PERFORMED  Clint Bolder P Julieann Drummonds 11/07/2023, 11:50 AM

## 2023-11-07 NOTE — Progress Notes (Addendum)
Patient ID: Jordan Collier, female   DOB: 08-08-1946, 78 y.o.   MRN: [id number]  Message from [facility] wants her to come back to rehab then transition back to her independent apartment. This way the adaptations can be done to her apartment and ready for when she returns.  10:37 Am Message left for [facility] to discuss discharge date. Await return call.  11:57 AM Left another message for Kenard Gower [facility]. Pt aware and eager to find out when leaving.  2:19 PM have spoken with Kenard Gower she is looking a bed availability and transport for pt to get to their facility. Will update once know day leaving.  2:44 PM Will be either Tuesday or Wednesday according to [facility]. Have let pt know of this and will confirm Monday due to coordinating transportation for pt

## 2023-11-07 NOTE — Progress Notes (Signed)
Physical Therapy Session Note  Patient Details  Name: Jordan Collier MRN: [id number] Date of Birth: [birth year]  Today's Date: 11/07/2023 PT Individual Time: 0800-0900 PT Individual Time Calculation (min): 60 min   Short Term Goals: Week 2:  PT Short Term Goal 1 (Week 2): Pt will complete bed mobility with supervision PT Short Term Goal 2 (Week 2): Pt will complete bed<>chair transfers with supervision and LRAD PT Short Term Goal 3 (Week 2): Pt will ambulate 299ft with CGA and LRAD PT Short Term Goal 4 (Week 2): Pt will improve BERG balance score by at least 8 points  Skilled Therapeutic Interventions/Progress Updates:      Pt sitting in wheelchair and ready for therapy - has no c/o pain. Sit<>Stand to rollator with supervision with cues for brake management. Ambulates with rollator and supervision assist to main rehab gym, ~156ft. Cues for R Heel strike and R hip/knee flexion for swing.  Completed her "morning routine" for morning stretches - overhead reaches, overhead reaches with sidebends, toe grabs, and trunk twists. CGA for safety.   RLE NMR for hip flexor strengthening: -modified thomas stretch position with RLE hanging off mat - 2x12 hip flexion to table top unweighted + 3x8 hip flexion to table top with 1.5# ankle ewight -standing hurdle egress with unilateral UE support: 6",  9",  11" hurdle - instructed in stepping forward/backward to facilitate hip flexion and knee flexion. 1.5# ankle weight attached. -standing on airex in // bar without UE support and mirror for visual feedback - completed head turns, head nods, trunk twists.  Returned to her room and patient left sitting in wheelchair; all her needs met with call bell in reach.   Therapy Documentation Precautions:  Precautions Precautions: Fall Precaution Comments: Rt hemiparesis Restrictions Weight Bearing Restrictions Per Provider Order: No General:     Therapy/Group: Individual Therapy  Denice Cardon P Daralyn Bert   PT, DPT, CSRS   11/07/2023, 7:46 AM

## 2023-11-07 NOTE — Plan of Care (Signed)
  Problem: Consults Goal: RH STROKE PATIENT EDUCATION Description: See Patient Education module for education specifics  Outcome: Progressing   Problem: RH SAFETY Goal: RH STG ADHERE TO SAFETY PRECAUTIONS W/ASSISTANCE/DEVICE Description: STG Adhere to Safety Precautions With cues Assistance/Device. Outcome: Progressing   Problem: RH KNOWLEDGE DEFICIT Goal: RH STG INCREASE KNOWLEDGE OF HYPERTENSION Description: Patient will be able to manage HTN using educational resources for medications,  and diet modification independently Outcome: Progressing Goal: RH STG INCREASE KNOWLEGDE OF HYPERLIPIDEMIA Description: Patient will be able to manage HLD using educational resources for medications,  and diet modification independently Outcome: Progressing Goal: RH STG INCREASE KNOWLEDGE OF STROKE PROPHYLAXIS Description: Patient will be able to manage secondary risks using educational resources for medications,  and diet modification independently Outcome: Progressing

## 2023-11-07 NOTE — Plan of Care (Signed)
Goals upgraded to mod I  Problem: RH Balance Goal: LTG Patient will maintain dynamic standing balance (PT) Description: LTG:  Patient will maintain dynamic standing balance with assistance during mobility activities (PT) Flowsheets (Taken 11/07/2023 1415) LTG: Pt will maintain dynamic standing balance during mobility activities with:: Independent with assistive device    Problem: Sit to Stand Goal: LTG:  Patient will perform sit to stand with assistance level (PT) Description: LTG:  Patient will perform sit to stand with assistance level (PT) Flowsheets (Taken 11/07/2023 1415) LTG: PT will perform sit to stand in preparation for functional mobility with assistance level: Independent with assistive device   Problem: RH Bed Mobility Goal: LTG Patient will perform bed mobility with assist (PT) Description: LTG: Patient will perform bed mobility with assistance, with/without cues (PT). Flowsheets (Taken 11/07/2023 1415) LTG: Pt will perform bed mobility with assistance level of: Independent with assistive device    Problem: RH Bed to Chair Transfers Goal: LTG Patient will perform bed/chair transfers w/assist (PT) Description: LTG: Patient will perform bed to chair transfers with assistance (PT). Flowsheets (Taken 11/07/2023 1415) LTG: Pt will perform Bed to Chair Transfers with assistance level: Independent with assistive device    Problem: RH Car Transfers Goal: LTG Patient will perform car transfers with assist (PT) Description: LTG: Patient will perform car transfers with assistance (PT). Flowsheets (Taken 11/07/2023 1415) LTG: Pt will perform car transfers with assist:: Independent with assistive device    Problem: RH Ambulation Goal: LTG Patient will ambulate in controlled environment (PT) Description: LTG: Patient will ambulate in a controlled environment, # of feet with assistance (PT). Flowsheets (Taken 11/07/2023 1415) LTG: Pt will ambulate in controlled environ  assist needed::  Independent with assistive device LTG: Ambulation distance in controlled environment: 141ft Goal: LTG Patient will ambulate in home environment (PT) Description: LTG: Patient will ambulate in home environment, # of feet with assistance (PT). Flowsheets (Taken 11/07/2023 1415) LTG: Pt will ambulate in home environ  assist needed:: Independent with assistive device LTG: Ambulation distance in home environment: 78ft   Problem: RH Stairs Goal: LTG Patient will ambulate up and down stairs w/assist (PT) Description: LTG: Patient will ambulate up and down # of stairs with assistance (PT) Flowsheets (Taken 11/07/2023 1415) LTG: Pt will ambulate up/down stairs assist needed:: Independent with assistive device LTG: Pt will  ambulate up and down number of stairs: at least 4 with 2 hand rails

## 2023-11-07 NOTE — Progress Notes (Addendum)
Physical Therapy Session Note  Patient Details  Name: Cidney Naff MRN: [id number] Date of Birth: [birth year]  Today's Date: 11/07/2023 PT Individual Time: 6578-4696 PT Individual Time Calculation (min): 83 min   Short Term Goals: Week 2:  PT Short Term Goal 1 (Week 2): Pt will complete bed mobility with supervision PT Short Term Goal 2 (Week 2): Pt will complete bed<>chair transfers with supervision and LRAD PT Short Term Goal 3 (Week 2): Pt will ambulate 251ft with CGA and LRAD PT Short Term Goal 4 (Week 2): Pt will improve BERG balance score by at least 8 points  Skilled Therapeutic Interventions/Progress Updates: Pt presents coming out of BR w/ nursing, and agreeable to therapy.  Pt handed off to PT at sink.  Pt amb > 200' to small gym w/ rollator and supervision, cues for R hip/knee flexion w/ swing phase.  Pt performed Nu-step at level 5 x 10' for UE/LE strengthening/NMR followed by 4' w/ LES only at level 4 cueing for RLE coordination/control.  Pt performed total of 965 steps and average of 68 spm.  Pt amb to main gym w/ supervision.  Pt performed standing there ex in // bars for hip flexion, cues for weight shift and glut/hip activation w/ RLE WB.  Pt performed sidestepping w/o UE support.  Pt performed 1st set w/o wt and then B 2# weights to ankles.  Pt amb multiple trials w/o AD and CGA/min A > 100' w/ cues for improved RLE gait cycle, foot clearance.  Pt returned to room for requested use of BR.  Pt required cues for rollator management in confined space of BR, but supervision for toilet transfers and clothing management.  Pt continent of bladder in toilet, charted in Flowsheets.  Pt amb x 120' including turn to return to w/c.  Pt remained sitting w/ all needs in reach.     Therapy Documentation Precautions:  Precautions Precautions: Fall Precaution Comments: Rt hemiparesis Restrictions Weight Bearing Restrictions Per Provider Order: No General:   Vital Signs:   Pain:0/10       Therapy/Group: Individual Therapy  Lucio Edward 11/07/2023, 11:17 AM

## 2023-11-08 ENCOUNTER — Encounter (HOSPITAL_COMMUNITY): Payer: Self-pay | Admitting: Physical Medicine and Rehabilitation

## 2023-11-08 DIAGNOSIS — I6381 Other cerebral infarction due to occlusion or stenosis of small artery: Secondary | ICD-10-CM | POA: Diagnosis not present

## 2023-11-08 NOTE — Progress Notes (Signed)
PROGRESS NOTE   Subjective/Complaints: No new complaints this morning Discussed discharge to SNF at [facility] next week She would like basket for her rollator  ROS: +feels cold at 3am, +right sided weakness- improved, +itchiness left lateral side.  Denies CP/SOB/abdominal pain + L rib wall pain-improved, +dry eyes, poor night due to delayed response to calls, +intermittent pain from shingles  Objective:   No results found. No results for input(s): "WBC", "HGB", "HCT", "PLT" in the last 72 hours.   No results for input(s): "NA", "K", "CL", "CO2", "GLUCOSE", "BUN", "CREATININE", "CALCIUM" in the last 72 hours.    Intake/Output Summary (Last 24 hours) at 11/08/2023 1314 Last data filed at 11/08/2023 1252 Gross per 24 hour  Intake 716 ml  Output --  Net 716 ml        Physical Exam: Vital Signs Blood pressure (!) 129/58, pulse 71, temperature (!) 97.4 F (36.3 C), resp. rate 18, height 5\' 8"  (1.727 m), weight 67.7 kg, SpO2 100%. Gen: no distress, normal appearing, sitting in chair HEENT: oral mucosa pink and moist, NCAT Cardio: Reg rate Chest: normal effort, normal rate of breathing Abd: soft, non-distended Ext: no edema, small pointing area of her proximal toenail great toe Psych: pleasant, normal affect Musculoskeletal:        General: No swelling or tenderness. Normal range of motion.     Cervical back: Normal range of motion.  Skin:    General: Warm and dry Neurological:     Mental Status: She is alert.     Comments: Alert and oriented x 3. Normal insight and awareness. Intact Memory. Normal language with mild dysarthria. Cranial nerve exam unremarkable except for right central VII and right hemifacial sensory loss. MMT: RUE is 4/5 prox to distal. RLE 4/5 throughout. LUE and LLE 4+ to 5/5. Sensory exam notable for 1/2 to LT and pain in right arm and leg. Decreased FMC of right side with PD. No frank ataxia.  No  abnl resting tone. DTR's 1+. Improved 1/18 Psychiatric:        Mood and Affect: Very pleasant    Behavior: Behavior normal.    Assessment/Plan: 1. Functional deficits which require 3+ hours per day of interdisciplinary therapy in a comprehensive inpatient rehab setting. Physiatrist is providing close team supervision and 24 hour management of active medical problems listed below. Physiatrist and rehab team continue to assess barriers to discharge/monitor patient progress toward functional and medical goals  Care Tool:  Bathing    Body parts bathed by patient: Right arm, Left arm, Chest, Abdomen, Front perineal area, Right upper leg, Left upper leg, Face, Right lower leg, Left lower leg, Buttocks   Body parts bathed by helper: Buttocks, Right lower leg, Left lower leg     Bathing assist Assist Level: Contact Guard/Touching assist     Upper Body Dressing/Undressing Upper body dressing   What is the patient wearing?: Pull over shirt    Upper body assist Assist Level: Supervision/Verbal cueing    Lower Body Dressing/Undressing Lower body dressing      What is the patient wearing?: Underwear/pull up, Pants     Lower body assist Assist for lower body dressing: Contact  Guard/Touching assist     Editor, commissioning assist Assist for toileting: Minimal Assistance - Patient > 75%     Transfers Chair/bed transfer  Transfers assist     Chair/bed transfer assist level: Contact Guard/Touching assist     Locomotion Ambulation   Ambulation assist      Assist level: Supervision/Verbal cueing Assistive device: Rollator Max distance: 200+   Walk 10 feet activity   Assist     Assist level: Supervision/Verbal cueing Assistive device: Rollator   Walk 50 feet activity   Assist Walk 50 feet with 2 turns activity did not occur: Safety/medical concerns (RLE weakness)  Assist level: Supervision/Verbal cueing Assistive device: Rollator    Walk 150  feet activity   Assist Walk 150 feet activity did not occur: Safety/medical concerns  Assist level: Supervision/Verbal cueing Assistive device: Rollator    Walk 10 feet on uneven surface  activity   Assist Walk 10 feet on uneven surfaces activity did not occur: Safety/medical concerns         Wheelchair     Assist Is the patient using a wheelchair?: Yes Type of Wheelchair: Manual    Wheelchair assist level: Total Assistance - Patient < 25% Max wheelchair distance: 29ft    Wheelchair 50 feet with 2 turns activity    Assist        Assist Level: Total Assistance - Patient < 25%   Wheelchair 150 feet activity     Assist      Assist Level: Total Assistance - Patient < 25%   Blood pressure (!) 129/58, pulse 71, temperature (!) 97.4 F (36.3 C), resp. rate 18, height 5\' 8"  (1.727 m), weight 67.7 kg, SpO2 100%.  Medical Problem List and Plan: 1. 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              -patient may  shower             -ELOS/Goals: 12-16 days, mod I goals             -she is having some waxing and waning sensory symptoms particularly in RLE. Discussed characteristics of these, particularly after thalamic stroke. Reassured her that it wasn't the sign of a progressive infarct  Team conference held 1/8  Grounds pass ordered  continue CIR  D/c to [facility] when bed is available  2.  Antithrombotics: D/c lovenox since ambulating >150 feet, encouraged her to move throughout the day for clot prevention             -antiplatelet therapy: continue plavix and 81mg  asa for 3 weeks then asa alone.   3. Pain Management: Tylenol prn.  4. Insomnia: well controlled, d/c melatonin  5. Neuropsych/cognition: This patient is capable of making decisions on her own behalf. 6. Skin/Wound Care: Routine pressure relief measures.   7. Hyponatremia: stabilized, d/c labs  Discussed adding  pinch of salt to water in morning  8. Blood pressure: well controlled     11/08/2023   12:57 PM 11/08/2023    5:37 AM 11/07/2023    7:53 PM  Vitals with BMI  Systolic 129 120 161  Diastolic 58 60 65  Pulse 71 64 75     9. Hypercholesterolemia: LDL-196. continue Zetia and Leqvio pending approval. Discussed that she had prior negative side effects to statin.  10. Hyperglycemia: discussed that CBGs are lwell controlled can d/c CBG checks  11. Low protein: educated patient  regarding levels, Ensure ordered q24H prn   12. Vitamin D insufficiency: D3 ordered, continue 3,000U daily  13. Constipation: d/c bisacodyl suppository.  14.  Left rib area pain.  Slight redness noted.  Concern for shingles  -d/c valtrex since completed course  15. Polypharmacy: d/c lovenox given improved ambulation, d/c prn tylenol since denies pain  16. Dry eyes: placed order that she may use her home eye drops   LOS: 11 days A FACE TO FACE EVALUATION WAS PERFORMED  Jordan Collier P Keenon Leitzel 11/08/2023, 1:14 PM

## 2023-11-08 NOTE — Plan of Care (Signed)
  Problem: Consults Goal: RH STROKE PATIENT EDUCATION Description: See Patient Education module for education specifics  Outcome: Progressing   Problem: RH SAFETY Goal: RH STG ADHERE TO SAFETY PRECAUTIONS W/ASSISTANCE/DEVICE Description: STG Adhere to Safety Precautions With cues Assistance/Device. Outcome: Progressing   Problem: RH KNOWLEDGE DEFICIT Goal: RH STG INCREASE KNOWLEDGE OF HYPERTENSION Description: Patient will be able to manage HTN using educational resources for medications,  and diet modification independently Outcome: Progressing Goal: RH STG INCREASE KNOWLEGDE OF HYPERLIPIDEMIA Description: Patient will be able to manage HLD using educational resources for medications,  and diet modification independently Outcome: Progressing Goal: RH STG INCREASE KNOWLEDGE OF STROKE PROPHYLAXIS Description: Patient will be able to manage secondary risks using educational resources for medications,  and diet modification independently Outcome: Progressing

## 2023-11-08 NOTE — Progress Notes (Signed)
Occupational Therapy Session Note  Patient Details  Name: Jordan Collier MRN: [id number] Date of Birth: 01/30/1946  Today's Date: 11/08/2023 OT Individual Time: 6063-0160 OT Individual Time Calculation (min): 60 min    Short Term Goals: Week 1:  OT Short Term Goal 1 (Week 1): Pt will complete sit > stand in prep for ADL with CGA using LRAD OT Short Term Goal 1 - Progress (Week 1): Met OT Short Term Goal 2 (Week 1): Pt will complete 2/3 toileting steps with min A for dynamic balance OT Short Term Goal 2 - Progress (Week 1): Met OT Short Term Goal 3 (Week 1): Pt will complete toilet transfer with CGA using LRAD OT Short Term Goal 3 - Progress (Week 1): Met  Skilled Therapeutic Interventions/Progress Updates:    Patient up in w/c at the time of arrival indicated that she wasn't able to rest well secondary to hospital noises.  The pt had no report of pain at the time of treatment and expressed a desire to shower during this treatment session. Patient able to doff clothing items, a over head shirt, panties, and socks with SBA. The pt was able to ambulate to the restroom using the RW with CGA. The pt was able to lower herself onto the commode using the grab bars. The patient was able to complete hyigene with SBA and was able to ambulate to the shower with close S using the grab bars. The pt was able to  bathe UB/LB with close S , the pt was able to exit the shower using the grab bar. The pt was able to transfer to the 3in1 for donning her underwear with s/u A .  The pt was able to exit the restroom using the w/c and was able to donn her sock with SBA. The pt went on to donn her over head shirt and  pants with s/uA. The pt was able to transport herself to the sink using her feet at w/c LOF with close S for completing a grooming task of brushing her teeth and brushing her her with SBA.    Following the BADL related task, the pt went on to complete UB exercises using a towel for shld flexion,  horizontal abduction, shld rotation, and lg circle 2 sets of 10 with rest breaks as needed. At the end of the session, the pt remained at w/c LOF with her call light and bedside table within reach.  All additional needs were addressed prior to exiting the room.   Therapy Documentation Precautions:  Precautions Precautions: Fall Precaution Comments: Rt hemiparesis Restrictions Weight Bearing Restrictions Per Provider Order: No   Therapy/Group: Individual Therapy  Lavona Mound 11/08/2023, 3:41 PM

## 2023-11-08 NOTE — Progress Notes (Signed)
Physical Therapy Session Note  Patient Details  Name: Jordan Collier MRN: [id number] Date of Birth: [birth year]  Today's Date: 11/08/2023 PT Individual Time: 0800-0842 PT Individual Time Calculation (min): 42 min   Short Term Goals: Week 2:  PT Short Term Goal 1 (Week 2): Pt will complete bed mobility with supervision PT Short Term Goal 2 (Week 2): Pt will complete bed<>chair transfers with supervision and LRAD PT Short Term Goal 3 (Week 2): Pt will ambulate 272ft with CGA and LRAD PT Short Term Goal 4 (Week 2): Pt will improve BERG balance score by at least 8 points  Skilled Therapeutic Interventions/Progress Updates:      Pt sitting in wheelchair, agreeable to PT tx. Has no c.o pain. Reports poor nights rest due to "loud neighbors."  Sit<>Stand to rollator with supervision assist. Ambulates with supervision and rollator ~149ft to main rehab gym, cues for R hip/knee flexion in swing.   Completed her morning routine of daily stretches in standing - overhead reaching, overhead reaching side bends, trunk twists/rotations, toe touches. Completed with supervision assist and no LOB.  Worked on RLE NMR for R hip strengthening: -Thomas stretch for R hip -Thomas stretch position with R hip off edge of mat table, 2x10 hip flexion with 1.5# ankle weight -Supine position hip abd/add with red TB around ankles -Hooklying position hip abd/add with red TB around knees -Hooklying position ball squeezes -Clam with red TB resistance around knees -Sidelying hip abd with 1.5# ankle weight  -Sidelying hamstring curls using powder board and 1.5# ankle weight  Ambulated ~136ft back to her room with supervision using the rollator - added 5# ankle weight to RLE for NMR and continued hip flexor strengthening.  Ended treatment sitting in wheelchair, all her needs met.   Therapy Documentation Precautions:  Precautions Precautions: Fall Precaution Comments: Rt hemiparesis Restrictions Weight Bearing  Restrictions Per Provider Order: No General:      Therapy/Group: Individual Therapy  Orrin Brigham 11/08/2023, 7:41 AM

## 2023-11-09 DIAGNOSIS — I6381 Other cerebral infarction due to occlusion or stenosis of small artery: Secondary | ICD-10-CM | POA: Diagnosis not present

## 2023-11-09 NOTE — Progress Notes (Signed)
PROGRESS NOTE   Subjective/Complaints: No new complaints this morning Disappointed that she does not have therapy today No issues overnight   ROS: +feels cold at 3am, +right sided weakness- improved, +itchiness left lateral side.  Denies CP/SOB/abdominal pain + L rib wall pain-improved, +dry eyes, poor night due to delayed response to calls, +intermittent pain from shingles- improved  Objective:   No results found. No results for input(s): "WBC", "HGB", "HCT", "PLT" in the last 72 hours.   No results for input(s): "NA", "K", "CL", "CO2", "GLUCOSE", "BUN", "CREATININE", "CALCIUM" in the last 72 hours.    Intake/Output Summary (Last 24 hours) at 11/09/2023 1626 Last data filed at 11/09/2023 1250 Gross per 24 hour  Intake 354 ml  Output --  Net 354 ml        Physical Exam: Vital Signs Blood pressure (!) 108/54, pulse 84, temperature (!) 97.5 F (36.4 C), temperature source Oral, resp. rate 18, height 5\' 8"  (1.727 m), weight 67.7 kg, SpO2 100%. Gen: no distress, normal appearing, sitting in chair HEENT: oral mucosa pink and moist, NCAT Cardio: Reg rate Chest: normal effort, normal rate of breathing Abd: soft, non-distended Ext: no edema, small pointing area of her proximal toenail great toe Psych: pleasant, normal affect Musculoskeletal:        General: No swelling or tenderness. Normal range of motion.     Cervical back: Normal range of motion.  Skin:    General: Warm and dry Neurological:     Mental Status: She is alert.     Comments: Alert and oriented x 3. Normal insight and awareness. Intact Memory. Normal language with mild dysarthria. Cranial nerve exam unremarkable except for right central VII and right hemifacial sensory loss. MMT: RUE is 4/5 prox to distal. RLE 4/5 throughout. LUE and LLE 4+ to 5/5. Sensory exam notable for 1/2 to LT and pain in right arm and leg. Decreased FMC of right side with PD. No  frank ataxia.  No abnl resting tone. DTR's 1+. Stable 1/19 Psychiatric:        Mood and Affect: Very pleasant    Behavior: Behavior normal.    Assessment/Plan: 1. Functional deficits which require 3+ hours per day of interdisciplinary therapy in a comprehensive inpatient rehab setting. Physiatrist is providing close team supervision and 24 hour management of active medical problems listed below. Physiatrist and rehab team continue to assess barriers to discharge/monitor patient progress toward functional and medical goals  Care Tool:  Bathing    Body parts bathed by patient: Right arm, Left arm, Chest, Abdomen, Front perineal area, Right upper leg, Left upper leg, Face, Right lower leg, Left lower leg, Buttocks   Body parts bathed by helper: Buttocks, Right lower leg, Left lower leg     Bathing assist Assist Level: Contact Guard/Touching assist     Upper Body Dressing/Undressing Upper body dressing   What is the patient wearing?: Pull over shirt    Upper body assist Assist Level: Supervision/Verbal cueing    Lower Body Dressing/Undressing Lower body dressing      What is the patient wearing?: Underwear/pull up, Pants     Lower body assist Assist for lower body dressing: Contact  Guard/Touching assist     Editor, commissioning assist Assist for toileting: Minimal Assistance - Patient > 75%     Transfers Chair/bed transfer  Transfers assist     Chair/bed transfer assist level: Contact Guard/Touching assist     Locomotion Ambulation   Ambulation assist      Assist level: Supervision/Verbal cueing Assistive device: Rollator Max distance: 200+   Walk 10 feet activity   Assist     Assist level: Supervision/Verbal cueing Assistive device: Rollator   Walk 50 feet activity   Assist Walk 50 feet with 2 turns activity did not occur: Safety/medical concerns (RLE weakness)  Assist level: Supervision/Verbal cueing Assistive device: Rollator     Walk 150 feet activity   Assist Walk 150 feet activity did not occur: Safety/medical concerns  Assist level: Supervision/Verbal cueing Assistive device: Rollator    Walk 10 feet on uneven surface  activity   Assist Walk 10 feet on uneven surfaces activity did not occur: Safety/medical concerns         Wheelchair     Assist Is the patient using a wheelchair?: Yes Type of Wheelchair: Manual    Wheelchair assist level: Total Assistance - Patient < 25% Max wheelchair distance: 41ft    Wheelchair 50 feet with 2 turns activity    Assist        Assist Level: Total Assistance - Patient < 25%   Wheelchair 150 feet activity     Assist      Assist Level: Total Assistance - Patient < 25%   Blood pressure (!) 108/54, pulse 84, temperature (!) 97.5 F (36.4 C), temperature source Oral, resp. rate 18, height 5\' 8"  (1.727 m), weight 67.7 kg, SpO2 100%.  Medical Problem List and Plan: 1. 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              -patient may  shower             -ELOS/Goals: 12-16 days, mod I goals             -she is having some waxing and waning sensory symptoms particularly in RLE. Discussed characteristics of these, particularly after thalamic stroke. Reassured her that it wasn't the sign of a progressive infarct  Team conference held 1/8  Grounds pass ordered  Continue CIR  D/c to [facility] when bed is available  2.  Antithrombotics: D/c lovenox since ambulating >150 feet, encouraged her to move throughout the day for clot prevention             -antiplatelet therapy: continue plavix and 81mg  asa for 3 weeks then asa alone.   3. Pain Management: Tylenol prn.  4. Insomnia: well controlled, d/c melatonin  5. Neuropsych/cognition: This patient is capable of making decisions on her own behalf. 6. Skin/Wound Care: Routine pressure relief measures.   7. Hyponatremia:  stabilized, d/c labs  Discussed adding pinch of salt to water in morning  8. Blood pressure: well controlled     11/09/2023    1:23 PM 11/09/2023    4:24 AM 11/08/2023    7:17 PM  Vitals with BMI  Systolic 108 115 284  Diastolic 54 51 67  Pulse 84 63 65     9. Hypercholesterolemia: LDL-196. continue Zetia and Leqvio pending approval. Discussed that she had prior negative side effects to statin.  10. Hyperglycemia: discussed that CBGs are lwell controlled can d/c CBG checks  11.  Low protein: educated patient regarding levels, Ensure ordered q24H prn   12. Vitamin D insufficiency: D3 ordered, continue 3,000U daily  13. Constipation: d/c bisacodyl suppository.  14.  Left rib area pain.  Slight redness noted.  Concern for shingles  -d/c valtrex since completed course  15. Polypharmacy: d/c lovenox given improved ambulation, d/c prn tylenol since denies pain, d/c enema  16. Dry eyes: placed order that she may use her home eye drops   LOS: 12 days A FACE TO FACE EVALUATION WAS PERFORMED  Drema Pry Liahna Brickner 11/09/2023, 4:26 PM

## 2023-11-09 NOTE — Plan of Care (Signed)
  Problem: Consults Goal: RH STROKE PATIENT EDUCATION Description: See Patient Education module for education specifics  Outcome: Progressing   Problem: RH SAFETY Goal: RH STG ADHERE TO SAFETY PRECAUTIONS W/ASSISTANCE/DEVICE Description: STG Adhere to Safety Precautions With cues Assistance/Device. Outcome: Progressing   Problem: RH KNOWLEDGE DEFICIT Goal: RH STG INCREASE KNOWLEDGE OF HYPERTENSION Description: Patient will be able to manage HTN using educational resources for medications,  and diet modification independently Outcome: Progressing Goal: RH STG INCREASE KNOWLEGDE OF HYPERLIPIDEMIA Description: Patient will be able to manage HLD using educational resources for medications,  and diet modification independently Outcome: Progressing Goal: RH STG INCREASE KNOWLEDGE OF STROKE PROPHYLAXIS Description: Patient will be able to manage secondary risks using educational resources for medications,  and diet modification independently Outcome: Progressing

## 2023-11-10 DIAGNOSIS — I6381 Other cerebral infarction due to occlusion or stenosis of small artery: Secondary | ICD-10-CM | POA: Diagnosis not present

## 2023-11-10 DIAGNOSIS — K5900 Constipation, unspecified: Secondary | ICD-10-CM | POA: Diagnosis not present

## 2023-11-10 DIAGNOSIS — E871 Hypo-osmolality and hyponatremia: Secondary | ICD-10-CM | POA: Diagnosis not present

## 2023-11-10 MED ORDER — ENSURE ENLIVE PO LIQD
237.0000 mL | ORAL | Status: DC
  Administered 2023-11-10 – 2023-11-11 (×2): 237 mL via ORAL

## 2023-11-10 NOTE — Progress Notes (Signed)
Physical Therapy Session Note  Patient Details  Name: Jordan Collier MRN: [id number] Date of Birth: [birth year]  Today's Date: 11/10/2023 PT Individual Time: 1015-1115 PT Individual Time Calculation (min): 60 min   Short Term Goals: Week 2:  PT Short Term Goal 1 (Week 2): Pt will complete bed mobility with supervision PT Short Term Goal 2 (Week 2): Pt will complete bed<>chair transfers with supervision and LRAD PT Short Term Goal 3 (Week 2): Pt will ambulate 259ft with CGA and LRAD PT Short Term Goal 4 (Week 2): Pt will improve BERG balance score by at least 8 points  Skilled Therapeutic Interventions/Progress Updates:       Pt sitting in wheelchair to start - has no c/o pain, eager to start therapy. Upset she didn't get therapy yesterday, but understanding.  Sit<>Stand to rollator with supervision - cues for locking brakes only. Ambulates with supervision and rollator from her room to main rehab gym, ~136ft - cues for increasing R hip/knee flexion in swing to reduce foot dragging.   Pt instructed in using the rollator - she navigated a total of 1,055ft within the 6 minutes - no standing or seated rest break needed. Pt reporting just mild fatigue. Age/sex matched norm = 1,545 ft.   Pt instructed in Timed Up and Go: TUG 19s with rollator  TUG 18s with no AD (CGA for safety) *Times > 15 seconds indicate increased falls risk.  5xSTS 18s from low sitting chair. She must use BUE to push from arm rests, unable to stand without using her arms to push.   Stair training completed using B HR's and 6" steps - able to navigate up/down x12 steps without rest break, reciprocal stepping for both directions without LOB or knee buckling.  Personal Rollator delivered - adjusted to fit - educated on general precautions and safety measures. Ambulation completed ~328ft with her rollator to ensure adequate fit and comfortability - also practiced turning to sit/stand from rollator while ambulating  with CGA for safety - would benefit from further practice.   Ended treatment sitting in wheelchair with all her needs met, call bell within reach.   Therapy Documentation Precautions:  Precautions Precautions: Fall Precaution Comments: Rt hemiparesis Restrictions Weight Bearing Restrictions Per Provider Order: No General:     Therapy/Group: Individual Therapy  Orrin Brigham  11/10/2023, 7:48 AM

## 2023-11-10 NOTE — Progress Notes (Signed)
Occupational Therapy Session Note  Patient Details  Name: Jordan Collier MRN: [id number] Date of Birth: 1946/03/01  Today's Date: 11/10/2023 OT Individual Time: 2841-3244 OT Individual Time Calculation (min): 73 min    Short Term Goals: Week 2:  OT Short Term Goal 1 (Week 2): STG=LTG due to LOS  Skilled Therapeutic Interventions/Progress Updates:  Pt greeted seated in w/c, pt agreeable to OT intervention.      Transfers/bed mobility/functional mobility: pt completed all functional mobility with RW/rollator and supervision.   ADLs:  UB dressing:pt donned [place] shirt with set- up assist  LB dressing: pt donned pants and underwear with set- up assist  Footwear: pt donned socks/shoes via figure 4 with set- up assist.  Bathing: pt completed bathing from sitting/standing with distant supervision.  Transfers: ambulatory transfers with RW and distant supervision  Toileting: pt completed 3/3 toileting tasks with supervision, continent bladder void.   IADLS:pt completed laundry task with pt able to collect towels from lowest drawer, transport towels to laundry and place towels in washer/dryer with rollator and supervision. Pt additionally able to make up bed in apt with no AD and supervision, no LOB. Pt completed higher level balance task on airex cushion with pt able to stand with no UE support to fold towels.    Ended session with pt seated in w/c with all needs within reach.                   Therapy Documentation Precautions:  Precautions Precautions: Fall Precaution Comments: Rt hemiparesis Restrictions Weight Bearing Restrictions Per Provider Order: No  Pain: no pain     Therapy/Group: Individual Therapy  Barron Schmid 11/10/2023, 12:13 PM

## 2023-11-10 NOTE — Progress Notes (Signed)
PROGRESS NOTE   Subjective/Complaints: No new concerns this morning.  She is happy with her care and progress overall.  ROS: +right sided weakness- improved, +itchiness left lateral side.  Denies CP/SOB/abdominal pain/nausea/vomiting  +dry eyes +intermittent pain from shingles- improved  Objective:   No results found. No results for input(s): "WBC", "HGB", "HCT", "PLT" in the last 72 hours.   No results for input(s): "NA", "K", "CL", "CO2", "GLUCOSE", "BUN", "CREATININE", "CALCIUM" in the last 72 hours.    Intake/Output Summary (Last 24 hours) at 11/10/2023 1244 Last data filed at 11/10/2023 0717 Gross per 24 hour  Intake 1192 ml  Output --  Net 1192 ml        Physical Exam: Vital Signs Blood pressure (!) 110/58, pulse 63, temperature 97.9 F (36.6 C), resp. rate 18, height 5\' 8"  (1.727 m), weight 67.7 kg, SpO2 98%. Gen: no distress, normal appearing, sitting in chair HEENT: oral mucosa pink and moist, NCAT Cardio: Reg rate Chest: CTAB, normal effort, normal rate of breathing, on room air Abd: soft, non-distended Ext: no edema Psych: pleasant, normal affect Musculoskeletal:        General: No swelling or tenderness. Normal range of motion.     Cervical back: Normal range of motion.  Skin:    General: Warm and dry Neurological:     Mental Status: She is alert.     Comments: Alert and oriented x 3. Normal insight and awareness. Intact Memory. Normal language with mild dysarthria. Cranial nerve exam unremarkable except for right central VII and right hemifacial sensory loss. MMT: RUE is 4/5 prox to distal. RLE 4/5 throughout. LUE and LLE 4+ to 5/5. Sensory exam notable for 1/2 to LT and pain in right arm and leg. Decreased FMC of right side with PD. No frank ataxia.  No abnl resting tone. DTR's 1+. Stable 1/20 Psychiatric:        Mood and Affect: Very pleasant    Behavior: Behavior normal.     Assessment/Plan: 1. Functional deficits which require 3+ hours per day of interdisciplinary therapy in a comprehensive inpatient rehab setting. Physiatrist is providing close team supervision and 24 hour management of active medical problems listed below. Physiatrist and rehab team continue to assess barriers to discharge/monitor patient progress toward functional and medical goals  Care Tool:  Bathing    Body parts bathed by patient: Right arm, Left arm, Chest, Abdomen, Front perineal area, Right upper leg, Left upper leg, Face, Right lower leg, Left lower leg, Buttocks   Body parts bathed by helper: Buttocks, Right lower leg, Left lower leg     Bathing assist Assist Level: Supervision/Verbal cueing     Upper Body Dressing/Undressing Upper body dressing   What is the patient wearing?: Pull over shirt    Upper body assist Assist Level: Set up assist    Lower Body Dressing/Undressing Lower body dressing      What is the patient wearing?: Underwear/pull up, Pants     Lower body assist Assist for lower body dressing: Set up assist     Toileting Toileting    Toileting assist Assist for toileting: Supervision/Verbal cueing     Transfers Chair/bed transfer  Transfers  assist     Chair/bed transfer assist level: Supervision/Verbal cueing     Locomotion Ambulation   Ambulation assist      Assist level: Supervision/Verbal cueing Assistive device: Rollator Max distance: 200+   Walk 10 feet activity   Assist     Assist level: Supervision/Verbal cueing Assistive device: Rollator   Walk 50 feet activity   Assist Walk 50 feet with 2 turns activity did not occur: Safety/medical concerns (RLE weakness)  Assist level: Supervision/Verbal cueing Assistive device: Rollator    Walk 150 feet activity   Assist Walk 150 feet activity did not occur: Safety/medical concerns  Assist level: Supervision/Verbal cueing Assistive device: Rollator    Walk 10  feet on uneven surface  activity   Assist Walk 10 feet on uneven surfaces activity did not occur: Safety/medical concerns         Wheelchair     Assist Is the patient using a wheelchair?: Yes Type of Wheelchair: Manual    Wheelchair assist level: Total Assistance - Patient < 25% Max wheelchair distance: 76ft    Wheelchair 50 feet with 2 turns activity    Assist        Assist Level: Total Assistance - Patient < 25%   Wheelchair 150 feet activity     Assist      Assist Level: Total Assistance - Patient < 25%   Blood pressure (!) 110/58, pulse 63, temperature 97.9 F (36.6 C), resp. rate 18, height 5\' 8"  (1.727 m), weight 67.7 kg, SpO2 98%.  Medical Problem List and Plan: 1. 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              -patient may  shower             -ELOS/Goals: 12-16 days, mod I goals             -she is having some waxing and waning sensory symptoms particularly in RLE. Discussed characteristics of these, particularly after thalamic stroke. Reassured her that it wasn't the sign of a progressive infarct  Team conference held 1/8  Grounds pass ordered  Continue CIR  D/c to [facility] Wednesday 1/22  2.  Antithrombotics: D/c lovenox since ambulating >150 feet, encouraged her to move throughout the day for clot prevention             -antiplatelet therapy: continue plavix and 81mg  asa for 3 weeks then asa alone.   3. Pain Management: Tylenol prn.  4. Insomnia: well controlled, d/c melatonin  5. Neuropsych/cognition: This patient is capable of making decisions on her own behalf. 6. Skin/Wound Care: Routine pressure relief measures.   7. Hyponatremia: stabilized, d/c labs  Discussed adding pinch of salt to water in morning  -labs tomorrow   8. Blood pressure: well controlled     11/10/2023    4:13 AM 11/09/2023    7:21 PM 11/09/2023    1:23 PM  Vitals with BMI   Systolic 110 126 130  Diastolic 58 59 54  Pulse 63 77 84   -1/20 controlled continue current regimen   9. Hypercholesterolemia: LDL-196. continue Zetia and Leqvio pending approval. Discussed that she had prior negative side effects to statin.  10. Hyperglycemia: discussed that CBGs are lwell controlled can d/c CBG checks  Recheck tomorrow BMP  11. Low protein: educated patient regarding levels, Ensure ordered q24H prn   12. Vitamin D insufficiency: D3 ordered, continue 3,000U daily  13. Constipation: d/c  bisacodyl suppository.  -LBM 1/19, improved  14.  Left rib area pain.  Slight redness noted.  Concern for shingles  -d/c valtrex since completed course  15. Polypharmacy: d/c lovenox given improved ambulation, d/c prn tylenol since denies pain, d/c enema  16. Dry eyes: placed order that she may use her home eye drops   LOS: 13 days A FACE TO FACE EVALUATION WAS PERFORMED  Fanny Dance 11/10/2023, 12:44 PM

## 2023-11-10 NOTE — Progress Notes (Signed)
Patient ID: Jordan Collier, female   DOB: 01/18/1946, 78 y.o.   MRN: [id number] Spoke with [facility] admissions they have a bed on Wednesday and transport is set up for 2:00 pm. Have let staff know and pt is aware of this. Plan for Wedesday

## 2023-11-10 NOTE — Progress Notes (Signed)
Physical Therapy Session Note  Patient Details  Name: Jordan Collier MRN: [id number] Date of Birth: 01/15/1946  Today's Date: 11/10/2023 PT Individual Time: 1303-1400 PT Individual Time Calculation (min): 57 min   Short Term Goals: Week 2:  PT Short Term Goal 1 (Week 2): Pt will complete bed mobility with supervision PT Short Term Goal 2 (Week 2): Pt will complete bed<>chair transfers with supervision and LRAD PT Short Term Goal 3 (Week 2): Pt will ambulate 268ft with CGA and LRAD PT Short Term Goal 4 (Week 2): Pt will improve BERG balance score by at least 8 points  Skilled Therapeutic Interventions/Progress Updates:      Therapy Documentation Precautions:  Precautions Precautions: Fall Precaution Comments: Rt hemiparesis Restrictions Weight Bearing Restrictions Per Provider Order: No General:   Pt received seated in w/c and agreeable to therapy session focusing on gait mechanics and RLE strengthening. Pt denies pain.   Pt ambulated to from room to therapy gym (>284ft) with rollator and SBA.   Gait Training/balance: - 2x50" w/o rollator in front of mirror for visual feedback and CGA, v/c to increase hip/knee flexion for foot clearance and avoid crossing midline on R - 3x20" monster walks with red TB, CGA and hand held assist, v/c to increase abduction with each step on right -3x6 hurdles (6") on R, CGA and v/c to increase cadence - >200" w/o rollator and CGA from gym back to room, pt paused several times to regain balance d/t R foot clearance difficulty, more frequently lost balance while multitasking (encouraged to pause or decrease speed with environmental distractions)  TherEx: - 3x10 seated hip flexor march to LAQ with 2# ankle weight (decreased coordination on R compared to L), SBA - 3x10 standing HS curl with 2.5# ankle weight and BUE support, v/c to decrease speed of exercise, SBA - 2x8 sit<>stands from rollator +airex without UE support (pt reports 8-9/10  difficulty), SBA  NMR: - 3x10 marching in place with L UE support and 2.5# ankle weights, pt occasionally paused to reposition R foot prior to march, SBA   Pt left seated in w/c in room with all needs in reach.    Therapy/Group: Individual Therapy  [name] 11/10/2023, 4:12 PM

## 2023-11-11 DIAGNOSIS — Z8619 Personal history of other infectious and parasitic diseases: Secondary | ICD-10-CM | POA: Insufficient documentation

## 2023-11-11 DIAGNOSIS — E871 Hypo-osmolality and hyponatremia: Secondary | ICD-10-CM | POA: Diagnosis not present

## 2023-11-11 DIAGNOSIS — K5901 Slow transit constipation: Secondary | ICD-10-CM

## 2023-11-11 DIAGNOSIS — I6381 Other cerebral infarction due to occlusion or stenosis of small artery: Secondary | ICD-10-CM | POA: Diagnosis not present

## 2023-11-11 LAB — BASIC METABOLIC PANEL
Anion gap: 6 (ref 5–15)
BUN: 13 mg/dL (ref 8–23)
CO2: 27 mmol/L (ref 22–32)
Calcium: 9.3 mg/dL (ref 8.9–10.3)
Chloride: 102 mmol/L (ref 98–111)
Creatinine, Ser: 0.73 mg/dL (ref 0.44–1.00)
GFR, Estimated: >60 mL/min (ref >60)
Glucose, Bld: 94 mg/dL (ref 70–99)
Potassium: 4.6 mmol/L (ref 3.5–5.1)
Sodium: 135 mmol/L (ref 135–145)

## 2023-11-11 LAB — CBC
HCT: 39.6 % (ref 36.0–46.0)
Hemoglobin: 13.1 g/dL (ref 12.0–15.0)
MCH: 27.9 pg (ref 26.0–34.0)
MCHC: 33.1 g/dL (ref 30.0–36.0)
MCV: 84.4 fL (ref 80.0–100.0)
Platelets: 341 10*3/uL (ref 150–400)
RBC: 4.69 MIL/uL (ref 3.87–5.11)
RDW: 14.8 % (ref 11.5–15.5)
WBC: 9.2 10*3/uL (ref 4.0–10.5)
nRBC: 0 % (ref 0.0–0.2)

## 2023-11-11 MED ORDER — CLOPIDOGREL BISULFATE 75 MG PO TABS: 75.0000 mg | ORAL_TABLET | Freq: Every day | ORAL | Status: AC

## 2023-11-11 MED ORDER — ORAL CARE MOUTH RINSE: 15.0000 mL | OROMUCOSAL | Status: AC | PRN

## 2023-11-11 MED ORDER — WHITE PETROLATUM EX OINT: 1.0000 | TOPICAL_OINTMENT | CUTANEOUS | Status: DC | PRN

## 2023-11-11 NOTE — Progress Notes (Signed)
Physical Therapy Discharge Summary  Patient Details  Name: Jordan Collier MRN: [id number] Date of Birth: [birth year]  Date of Discharge from PT service:November 11, 2023  Patient has met 8 of 8 long term goals due to improved activity tolerance, improved balance, improved postural control, increased strength, ability to compensate for deficits, functional use of  right upper extremity and right lower extremity, improved attention, and improved awareness.  Patient to discharge at an ambulatory level Modified Independent.     Functional Outcome Measures: BERG 46/56 1,028ft with rollator 5xSTS 13.5 seconds  TUG 15.5 seconds with rollator TUG 15 seconds with no AD FGA 15/30   Reasons goals not met: n/a  Recommendation:  Patient will benefit from ongoing skilled PT services in home health setting to continue to advance safe functional mobility, address ongoing impairments in R sided weakness, dynamic standing balance, and minimize fall risk.  Equipment: Rollator  Reasons for discharge: treatment goals met and discharge from hospital  Patient/family agrees with progress made and goals achieved: Yes  PT Discharge Precautions/Restrictions Precautions Precautions: Fall Precaution Comments: Rt hemiparesis Restrictions Weight Bearing Restrictions Per Provider Order: No Pain Interference Pain Interference Pain Effect on Sleep: 0. Does not apply - I have not had any pain or hurting in the past 5 days Pain Interference with Therapy Activities: 0. Does not apply - I have not received rehabilitationtherapy in the past 5 days Pain Interference with Day-to-Day Activities: 1. Rarely or not at all Vision/Perception  Vision - History Ability to See in Adequate Light: 0 Adequate Perception Perception: Within Functional Limits Praxis Praxis: WFL  Cognition Overall Cognitive Status: Within Functional Limits for tasks assessed Arousal/Alertness: Awake/alert Orientation Level: Oriented  X4 Attention: Sustained;Focused;Selective Focused Attention: Appears intact Sustained Attention: Appears intact Selective Attention: Appears intact Memory: Appears intact Awareness: Appears intact Problem Solving: Appears intact Sequencing: Appears intact Decision Making: Appears intact Initiating: Appears intact Safety/Judgment: Appears intact Sensation Sensation Light Touch: Appears Intact Hot/Cold: Appears Intact Proprioception: Impaired by gross assessment Stereognosis: Appears Intact Additional Comments: Mild proprioception deficits on RLE Coordination Gross Motor Movements are Fluid and Coordinated: No Coordination and Movement Description: R hemi Heel Shin Test: slowed on R 2/2 weakness Motor  Motor Motor: Hemiplegia Motor - Skilled Clinical Observations: R hemiparesis  Mobility Bed Mobility Bed Mobility: Supine to Sit;Sit to Supine Supine to Sit: Independent with assistive device Sitting - Scoot to Edge of Bed: Independent with assistive device Sit to Supine: Independent with assistive device Transfers Transfers: Sit to Stand;Stand Pivot Transfers;Stand to Sit Sit to Stand: Independent with assistive device Stand to Sit: Independent with assistive device Stand Pivot Transfers: Independent with assistive device Transfer (Assistive device): Rollator Locomotion  Gait Ambulation: Yes Gait Assistance: Independent with assistive device Gait Distance (Feet): 1030 Feet ( ) Assistive device: Rollator Gait Gait: Yes Gait Pattern: Impaired Gait Pattern: Step-through pattern;Decreased step length - right;Decreased stance time - right;Decreased hip/knee flexion - right;Decreased dorsiflexion - right;Decreased weight shift to right;Narrow base of support;Poor foot clearance - right Stairs / Additional Locomotion Stairs: Yes Stairs Assistance: Supervision/Verbal cueing Stair Management Technique: Two rails;Forwards;Alternating pattern Number of Stairs: 12 Height of  Stairs: 6 Wheelchair Mobility Wheelchair Mobility: No  Trunk/Postural Assessment  Cervical Assessment Cervical Assessment: Within Functional Limits Thoracic Assessment Thoracic Assessment: Exceptions to [facility] (kyphotic (mild)) Lumbar Assessment Lumbar Assessment: Within Functional Limits Postural Control Postural Control: Within Functional Limits  Balance Balance Balance Assessed: Yes Standardized Balance Assessment Standardized Balance Assessment: Berg Balance Test;Functional Gait Assessment Berg Balance Test Sit to Stand:  Able to stand without using hands and stabilize independently Standing Unsupported: Able to stand safely 2 minutes Sitting with Back Unsupported but Feet Supported on Floor or Stool: Able to sit safely and securely 2 minutes Stand to Sit: Sits safely with minimal use of hands Transfers: Able to transfer safely, minor use of hands Standing Unsupported with Eyes Closed: Able to stand 10 seconds safely Standing Ubsupported with Feet Together: Able to place feet together independently and stand for 1 minute with supervision From Standing, Reach Forward with Outstretched Arm: Can reach forward >12 cm safely (5") From Standing Position, Pick up Object from Floor: Able to pick up shoe, needs supervision From Standing Position, Turn to Look Behind Over each Shoulder: Looks behind from both sides and weight shifts well Turn 360 Degrees: Able to turn 360 degrees safely but slowly Standing Unsupported, Alternately Place Feet on Step/Stool: Able to complete >2 steps/needs minimal assist Standing Unsupported, One Foot in Front: Able to plae foot ahead of the other independently and hold 30 seconds Standing on One Leg: Able to lift leg independently and hold 5-10 seconds Total Score: 46 Functional Gait  Assessment Gait assessed : Yes Gait Level Surface: Walks 20 ft in less than 7 sec but greater than 5.5 sec, uses assistive device, slower speed, mild gait deviations, or  deviates 6-10 in outside of the 12 in walkway width. Change in Gait Speed: Able to change speed, demonstrates mild gait deviations, deviates 6-10 in outside of the 12 in walkway width, or no gait deviations, unable to achieve a major change in velocity, or uses a change in velocity, or uses an assistive device. Gait with Horizontal Head Turns: Performs head turns smoothly with slight change in gait velocity (eg, minor disruption to smooth gait path), deviates 6-10 in outside 12 in walkway width, or uses an assistive device. Gait with Vertical Head Turns: Performs task with slight change in gait velocity (eg, minor disruption to smooth gait path), deviates 6 - 10 in outside 12 in walkway width or uses assistive device Gait and Pivot Turn: Pivot turns safely in greater than 3 sec and stops with no loss of balance, or pivot turns safely within 3 sec and stops with mild imbalance, requires small steps to catch balance. Step Over Obstacle: Is able to step over one shoe box (4.5 in total height) but must slow down and adjust steps to clear box safely. May require verbal cueing. Gait with Narrow Base of Support: Ambulates less than 4 steps heel to toe or cannot perform without assistance. Gait with Eyes Closed: Walks 20 ft, slow speed, abnormal gait pattern, evidence for imbalance, deviates 10-15 in outside 12 in walkway width. Requires more than 9 sec to ambulate 20 ft. Ambulating Backwards: Walks 20 ft, slow speed, abnormal gait pattern, evidence for imbalance, deviates 10-15 in outside 12 in walkway width. Steps: Alternating feet, must use rail. Total Score: 15  Extremity Assessment      RLE Assessment RLE Assessment: Exceptions to [facility] RLE Strength RLE Overall Strength: Deficits Right Hip Flexion: 4-/5 Right Hip ABduction: 3+/5 Right Hip ADduction: 4/5 Right Knee Flexion: 4-/5 Right Knee Extension: 4+/5 Right Ankle Dorsiflexion: 4/5 Right Ankle Plantar Flexion: 4/5 LLE Assessment LLE  Assessment: Within Functional Limits   Calvyn Kurtzman P Kalilah Barua  PT, DPT, CSRS  11/11/2023, 8:01 AM

## 2023-11-11 NOTE — Plan of Care (Signed)

## 2023-11-11 NOTE — Progress Notes (Signed)
PROGRESS NOTE   Subjective/Complaints: Pt with intermittent tingling in right leg, seems to come and go. Overall feeling well and prepared to back to SNF then ILF  ROS: Patient denies fever, rash, sore throat, blurred vision, dizziness, nausea, vomiting, diarrhea, cough, shortness of breath or chest pain, joint or back/neck pain, headache, or mood change.   Objective:   No results found. Recent Labs    11/11/23 0528  WBC 9.2  HGB 13.1  HCT 39.6  PLT 341     Recent Labs    11/11/23 0528  NA 135  K 4.6  CL 102  CO2 27  GLUCOSE 94  BUN 13  CREATININE 0.73  CALCIUM 9.3      Intake/Output Summary (Last 24 hours) at 11/11/2023 0949 Last data filed at 11/11/2023 0751 Gross per 24 hour  Intake 880 ml  Output --  Net 880 ml        Physical Exam: Vital Signs Blood pressure 114/60, pulse 75, temperature 98.3 F (36.8 C), resp. rate 16, height 5\' 8"  (1.727 m), weight 67.7 kg, SpO2 94%. Constitutional: No distress . Vital signs reviewed. HEENT: NCAT, EOMI, oral membranes moist Neck: supple Cardiovascular: RRR without murmur. No JVD    Respiratory/Chest: CTA Bilaterally without wheezes or rales. Normal effort    GI/Abdomen: BS +, non-tender, non-distended Ext: no clubbing, cyanosis, or edema Psych: pleasant and cooperative  Musculoskeletal:        General: No swelling or tenderness. Normal range of motion.     Cervical back: Normal range of motion.  Skin:    General: Warm and dry Neurological:     Mental Status: She is alert.     Comments: Alert and oriented x 3. Normal insight and awareness. Intact Memory. Normal language and speech. Cranial nerve exam unremarkable except for right facial sensory loss.. MMT: RUE is 4/5 prox to distal. RLE 4/5 throughout. LUE and LLE 4+ to 5/5. Sensory exam notable for 1/2 to LT and pain in right arm and leg. Improved dexterity RUE.  No frank ataxia.  No abnl resting tone. DTR's  1+. Stable 1/20    Assessment/Plan: 1. Functional deficits which require 3+ hours per day of interdisciplinary therapy in a comprehensive inpatient rehab setting. Physiatrist is providing close team supervision and 24 hour management of active medical problems listed below. Physiatrist and rehab team continue to assess barriers to discharge/monitor patient progress toward functional and medical goals  Care Tool:  Bathing    Body parts bathed by patient: Right arm, Left arm, Chest, Abdomen, Front perineal area, Right upper leg, Left upper leg, Face, Right lower leg, Left lower leg, Buttocks   Body parts bathed by helper: Buttocks, Right lower leg, Left lower leg     Bathing assist Assist Level: Supervision/Verbal cueing     Upper Body Dressing/Undressing Upper body dressing   What is the patient wearing?: Pull over shirt    Upper body assist Assist Level: Set up assist    Lower Body Dressing/Undressing Lower body dressing      What is the patient wearing?: Underwear/pull up, Pants     Lower body assist Assist for lower body dressing: Set up assist  Toileting Toileting    Toileting assist Assist for toileting: Supervision/Verbal cueing     Transfers Chair/bed transfer  Transfers assist     Chair/bed transfer assist level: Supervision/Verbal cueing     Locomotion Ambulation   Ambulation assist      Assist level: Supervision/Verbal cueing Assistive device: Rollator Max distance: 200+   Walk 10 feet activity   Assist     Assist level: Supervision/Verbal cueing Assistive device: Rollator   Walk 50 feet activity   Assist Walk 50 feet with 2 turns activity did not occur: Safety/medical concerns (RLE weakness)  Assist level: Supervision/Verbal cueing Assistive device: Rollator    Walk 150 feet activity   Assist Walk 150 feet activity did not occur: Safety/medical concerns  Assist level: Supervision/Verbal cueing Assistive device:  Rollator    Walk 10 feet on uneven surface  activity   Assist Walk 10 feet on uneven surfaces activity did not occur: Safety/medical concerns         Wheelchair     Assist Is the patient using a wheelchair?: Yes Type of Wheelchair: Manual    Wheelchair assist level: Total Assistance - Patient < 25% Max wheelchair distance: 33ft    Wheelchair 50 feet with 2 turns activity    Assist        Assist Level: Total Assistance - Patient < 25%   Wheelchair 150 feet activity     Assist      Assist Level: Total Assistance - Patient < 25%   Blood pressure 114/60, pulse 75, temperature 98.3 F (36.8 C), resp. rate 16, height 5\' 8"  (1.727 m), weight 67.7 kg, SpO2 94%.  Medical Problem List and Plan: 1. 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              -patient may  shower             -ELOS/Goals: 12-16 days, mod I goals             -she is having some waxing and waning sensory symptoms particularly in RLE. Discussed characteristics of these, particularly after thalamic stroke. Reassured her that it wasn't the sign of a progressive infarct  Team conference held 1/8  Grounds pass ordered  Continue CIR- I reassured her that her sensory sx might wax and wane to an extent  [employer] asking that she transition from their SNF to ILF. DC date 1/22  2.  Antithrombotics: -ambulatory, off lovenox now             -antiplatelet therapy: continue plavix and 81mg  asa for 3 weeks then asa alone.   3. Pain Management: Tylenol prn.  4. Insomnia: well controlled, d/c melatonin  5. Neuropsych/cognition: This patient is capable of making decisions on her own behalf. 6. Skin/Wound Care: Routine pressure relief measures.   7. Hyponatremia: stabilized, d/c labs  Discussed adding pinch of salt to water in morning  -1/21 Sodium 135 today  -labs fully reviewed and wnl  8. Blood pressure: well controlled      11/11/2023    5:26 AM 11/10/2023    8:08 PM 11/10/2023    1:06 PM  Vitals with BMI  Systolic 114 115 409  Diastolic 60 58 88  Pulse 75 81 75   -1/21 controlled continue current regimen   9. Hypercholesterolemia: LDL-196. continue Zetia and Leqvio pending approval. Discussed that she had prior negative side effects to statin.  10. Hyperglycemia: discussed that CBGs are lwell  controlled can d/c CBG checks  Glucose 94 1/21  11. Low protein: educated patient regarding levels, Ensure ordered q24H prn   12. Vitamin D insufficiency: D3 ordered, continue 3,000U daily  13. Constipation: d/c bisacodyl suppository.  -LBM 1/19, improved  14.  Left rib area pain.  Slight redness noted.  Concern for shingles  -off valtrex since completed course \ 16. Dry eyes: placed order that she may use her home eye drops   LOS: 14 days A FACE TO FACE EVALUATION WAS PERFORMED  Ranelle Oyster 11/11/2023, 9:49 AM

## 2023-11-11 NOTE — Progress Notes (Signed)
Occupational Therapy Session Note  Patient Details  Name: Jordan Collier MRN: [id number] Date of Birth: 1946/01/08  Today's Date: 11/11/2023 OT Individual Time: 0820-0900 OT Individual Time Calculation (min): 40 min  and Today's Date: 11/11/2023 OT Missed Time: 20 Minutes Missed Time Reason: Other (comment) (Scheduling Conflict)   Short Term Goals: Week 2:  OT Short Term Goal 1 (Week 2): STG=LTG due to LOS  Skilled Therapeutic Interventions/Progress Updates:     Missed 20 minutes of skilled OT treatment d/t therapist running late. Pt received sitting up in wc presenting to be in good spirits receptive to skilled OT session reporting 0/10 pain- OT offering intermittent rest breaks, repositioning, and therapeutic support to optimize participation in therapy session. Pt requesting to take shower this AM. Focus this session BADL retraining with emphasis on safety awareness and energy conservation in preparation for d/c. Pt able to complete functional mobility and functional shower transfers during session using rollator mod I without LOB noted. Pt doffed clothing while seated on TTB mod I. Pt chose to sit for shower demonstrating good application of EC techniques completing U/LB bathing with mod I and standing briefly only to wash bottom while holding onto grab bar. Following shower, Pt completed U/LB dressing seated in wc mod I using rollator for balance when standing to bring pants to waist. Pt stood at sink using rollator mod I to wash face and apply lotion. Pt presenting with appropriate safety awareness, good insight into deficits, and good application of energy conservation techniques throughout session today. Pt was left resting in wc with call bell in reach and all needs met.    Therapy Documentation Precautions:  Precautions Precautions: Fall Precaution Comments: Rt hemiparesis Restrictions Weight Bearing Restrictions Per Provider Order: No  Therapy/Group: Individual Therapy  Clide Deutscher 11/11/2023, 12:19 PM

## 2023-11-11 NOTE — Progress Notes (Incomplete)
Inpatient Rehabilitation Care Coordinator Discharge Note   Patient Details  Name: Jordan Collier MRN: [id number] Date of Birth: Oct 09, 1946   Discharge location: GOING TO [facility] THEN WILL TRANSITION BACK TO INDEPENDENT LIVIGN APARTMENT  Length of Stay: 15 DAYS  Discharge activity level: MOD/I LEVEL  Home/community participation: ACTIVE  Patient response UV:OZDGUY Literacy - How often do you need to have someone help you when you read instructions, pamphlets, or other written material from your doctor or pharmacy?: Never  Patient response QI:HKVQQV Isolation - How often do you feel lonely or isolated from those around you?: Rarely  Services provided included: MD, RD, PT, OT, SLP, RN, CM, TR, Pharmacy, SW  Financial Services:  Field seismologist Utilized: Marine scientist MEDICARE  Choices offered to/list presented to: PT  Follow-up services arranged:  DME, Other (Comment), Patient/Family has no preference for HH/DME agencies (GOING TO [facility])      DME : [facility]  ROLLATOR  WILL BE PICKED UP BY [facility] AT 2;00 AT ENTRANCE A  Patient response to transportation need: Is the patient able to respond to transportation needs?: Yes In the past 12 months, has lack of transportation kept you from medical appointments or from getting medications?: No In the past 12 months, has lack of transportation kept you from meetings, work, or from getting things needed for daily living?: No   Patient/Family verbalized understanding of follow-up arrangements:  Yes  Individual responsible for coordination of the follow-up plan: SELF [phone]  Confirmed correct DME delivered: Lucy Chris 11/11/2023    Comments (or additional information):[facility] AND PT WANT HER TO GO TO THE REHAB AND THEN TRANSITION BACK TO APARTMENT. THEY WILL ADAPT HER BATHROOM PRIOR TO HER RETURNING TO HER APARTMENT. INFORMED DREW ADMISSIONS AT [facility]  MEDICARE WILL NOT COVER FOR HER TO GO TO THE SNF SHE IS MOD/I LEVEL. [facility] WILL NEED TO FIGURE OUT THE PAYMENT ON THEIR END  Summary of Stay    Date/Time Discharge Planning CSW  11/05/23 0831 Pt plans to go to the rehab part of [facility] before returning to her independent apartment. Admissions at [facility] aware of this RGD  10/29/23 0949 New evaluation-going to [facility] prior to going back to her independent apartment. Son can not assist due to own health issues RGD       Avril Busser, Lemar Livings

## 2023-11-11 NOTE — Progress Notes (Signed)
Physical Therapy Session Note  Patient Details  Name: Jordan Collier MRN: [id number] Date of Birth: [birth year]  Today's Date: 11/11/2023 PT Individual Time: 0945-1100 PT Individual Time Calculation (min): 75 min   Short Term Goals: Week 2:  PT Short Term Goal 1 (Week 2): Pt will complete bed mobility with supervision PT Short Term Goal 2 (Week 2): Pt will complete bed<>chair transfers with supervision and LRAD PT Short Term Goal 3 (Week 2): Pt will ambulate 270ft with CGA and LRAD PT Short Term Goal 4 (Week 2): Pt will improve BERG balance score by at least 8 points  Skilled Therapeutic Interventions/Progress Updates:    Pt recd in w/c, No complaint of pain. Session focused on dynamic gait with and without rollator, with balance and functional transfer challenge. Pt ambulated throughout session with rollator at mod I level. Pt ambulated with rollator day room<>north tower (>800 ft) with rollator with 2 lb ankle weight on RLE for increased challenge. Note poor RLE clearance with intermittent toe drag, which pt is able to correct but worsens with fatigue, during seated rest break, discussed endurance and energy conservation, with emphasis on awareness of fatigue level and not pushing it when fatigued. Pt expressed understanding, but difficulty not pushing herself. Pt then performed 6 rounds of superset, x 10 box squats and x 170 ft gait with 2 lb ankle weight. Decreased height of mat to near height of rollator by last set. Pt then participated in half kneel<> stand, 2 x 3 BIL. Discussed safety and mechanics, CGA-min with 1 small LOB requiring min a. Pt also turned on compliant surface to alternate sides for balance challenge with CGA. Pt then performed figure 8 walking around cones, 2 x 1 min with 1 LOB requiring mod a to recover, no AD. Pt returned to room after session and was made mod I in room per discussion with primary therapist. Pt was instructed to use AD in room for safety. Pt remained  seated in w/c at end of session, with needs in reach.  Therapy Documentation Precautions:  Precautions Precautions: Fall Precaution Comments: Rt hemiparesis Restrictions Weight Bearing Restrictions Per Provider Order: No General:      Therapy/Group: Individual Therapy  Juluis Rainier 11/11/2023, 5:13 PM

## 2023-11-11 NOTE — Progress Notes (Signed)
Physical Therapy Session Note  Patient Details  Name: Jordan Collier MRN: [id number] Date of Birth: May 19, 1946  Today's Date: 11/11/2023 PT Individual Time: 1400-1500 PT Individual Time Calculation (min): 60 min   Short Term Goals: Week 2:  PT Short Term Goal 1 (Week 2): Pt will complete bed mobility with supervision PT Short Term Goal 2 (Week 2): Pt will complete bed<>chair transfers with supervision and LRAD PT Short Term Goal 3 (Week 2): Pt will ambulate 226ft with CGA and LRAD PT Short Term Goal 4 (Week 2): Pt will improve BERG balance score by at least 8 points  Skilled Therapeutic Interventions/Progress Updates:      Pt in room on arrival - agreeable to therapy treatment. Has no c/o pain.  Sit<>stand mod I to rollator. Ambulates mod I with rollator to ortho rehab gym, ~273ft.   Functional outcome measures completed for fall risk assessment: BERG 46/56 5xSTS 13.5 seconds  TUG 15.5 seconds with rollator TUG 15 seconds with no AD FGA 15/30.  See below for details  Patient demonstrates increased fall risk as noted by score of   46/56 on Berg Balance Scale.  (<36= high risk for falls, close to 100%; 37-45 significant >80%; 46-51 moderate >50%; 52-55 lower >25%)  Unlevel gait training on ramp and mulch pit with CGA and no AD - cues for RLE awareness to improve hip/knee flexion to avoid dragging R foot.   Stair training using 6" steps and 2 HR's - able to complete at supervision level without LOB or knee buckling - min cues for general sequencing with ascending with L and descending with R. Completes x12 steps in total.   Returned to her room at end of treatment - left in room with all needs met. Pt inquiring if she could shower independently. Messaged OT via secure chat - spoke with NT recommending supervision for safety.   Therapy Documentation Precautions:  Precautions Precautions: Fall Precaution Comments: Rt hemiparesis Restrictions Weight Bearing Restrictions Per  Provider Order: No General:    Balance Balance Balance Assessed: Yes Standardized Balance Assessment Standardized Balance Assessment: Berg Balance Test;Functional Gait Assessment Berg Balance Test Sit to Stand: Able to stand without using hands and stabilize independently Standing Unsupported: Able to stand safely 2 minutes Sitting with Back Unsupported but Feet Supported on Floor or Stool: Able to sit safely and securely 2 minutes Stand to Sit: Sits safely with minimal use of hands Transfers: Able to transfer safely, minor use of hands Standing Unsupported with Eyes Closed: Able to stand 10 seconds safely Standing Ubsupported with Feet Together: Able to place feet together independently and stand for 1 minute with supervision From Standing, Reach Forward with Outstretched Arm: Can reach forward >12 cm safely (5") From Standing Position, Pick up Object from Floor: Able to pick up shoe, needs supervision From Standing Position, Turn to Look Behind Over each Shoulder: Looks behind from both sides and weight shifts well Turn 360 Degrees: Able to turn 360 degrees safely but slowly Standing Unsupported, Alternately Place Feet on Step/Stool: Able to complete >2 steps/needs minimal assist Standing Unsupported, One Foot in Front: Able to plae foot ahead of the other independently and hold 30 seconds Standing on One Leg: Able to lift leg independently and hold 5-10 seconds Total Score: 46/56   Functional Gait  Assessment Gait assessed : Yes Gait Level Surface: Walks 20 ft in less than 7 sec but greater than 5.5 sec, uses assistive device, slower speed, mild gait deviations, or deviates 6-10 in outside of  the 12 in walkway width. Change in Gait Speed: Able to change speed, demonstrates mild gait deviations, deviates 6-10 in outside of the 12 in walkway width, or no gait deviations, unable to achieve a major change in velocity, or uses a change in velocity, or uses an assistive device. Gait with  Horizontal Head Turns: Performs head turns smoothly with slight change in gait velocity (eg, minor disruption to smooth gait path), deviates 6-10 in outside 12 in walkway width, or uses an assistive device. Gait with Vertical Head Turns: Performs task with slight change in gait velocity (eg, minor disruption to smooth gait path), deviates 6 - 10 in outside 12 in walkway width or uses assistive device Gait and Pivot Turn: Pivot turns safely in greater than 3 sec and stops with no loss of balance, or pivot turns safely within 3 sec and stops with mild imbalance, requires small steps to catch balance. Step Over Obstacle: Is able to step over one shoe box (4.5 in total height) but must slow down and adjust steps to clear box safely. May require verbal cueing. Gait with Narrow Base of Support: Ambulates less than 4 steps heel to toe or cannot perform without assistance. Gait with Eyes Closed: Walks 20 ft, slow speed, abnormal gait pattern, evidence for imbalance, deviates 10-15 in outside 12 in walkway width. Requires more than 9 sec to ambulate 20 ft. Ambulating Backwards: Walks 20 ft, slow speed, abnormal gait pattern, evidence for imbalance, deviates 10-15 in outside 12 in walkway width. Steps: Alternating feet, must use rail. Total Score: 15/30  Therapy/Group: Individual Therapy  Sabriah Hobbins P Jessamyn Watterson PT 11/11/2023, 7:52 AM

## 2023-11-11 NOTE — Discharge Summary (Signed)
Physician Discharge Summary  Patient ID: Roxie Bergner MRN: [id number] DOB/AGE: [birth year] 77 y.o.  Admit date: 10/28/2023 Discharge date: 11/12/2023  Discharge Diagnoses:  Principal Problem:   Thalamic stroke [facility]) Active Problems:   Hyperlipemia   Vitamin D insufficiency   History of shingles   Discharged Condition: stable  Significant Diagnostic Studies:  US THYROID Result Date: 10/25/2023 CLINICAL DATA:  Thyroid nodule seen on CT EXAM: THYROID ULTRASOUND TECHNIQUE: Ultrasound examination of the thyroid gland and adjacent soft tissues was performed. COMPARISON:  CTA head and neck 10/24/2023 FINDINGS: Parenchymal Echotexture: Mildly heterogenous Isthmus: 0.8 cm Right lobe: 3.8 x 2.2 x 2.1 cm Left lobe: 5.3 x 2.3 x 2.3 cm _________________________________________________________ Estimated total number of nodules >/= 1 cm: 3 Number of spongiform nodules >/=  2 cm not described below (TR1): 0 Number of mixed cystic and solid nodules >/= 1.5 cm not described below (TR2): 0 _________________________________________________________ Nodule # 1: Location: Right; inferior Maximum size: 1.3 cm; Other 2 dimensions: 1.2 x 1.0 cm Composition: solid/almost completely solid (2) Echogenicity: hypoechoic (2) Shape: not taller-than-wide (0) Margins: smooth (0) Echogenic foci: none (0) ACR TI-RADS total points: 4. ACR TI-RADS risk category: TR4 (4-6 points). ACR TI-RADS recommendations: *Given size (>/= 1 - 1.4 cm) and appearance, a follow-up ultrasound in 1 year should be considered based on TI-RADS criteria. _________________________________________________________ Nodule # 2: Location: Left; mid Maximum size: 0.7 cm; Other 2 dimensions: 0.5 x 0.7 cm Composition: solid/almost completely solid (2) Echogenicity: hypoechoic (2) Shape: not taller-than-wide (0) Margins: smooth (0) Echogenic foci: large comet-tail artifacts (0) ACR TI-RADS total points: 4. ACR TI-RADS risk category: TR4 (4-6 points). ACR TI-RADS  recommendations: Given size (<0.9 cm) and appearance, this nodule does NOT meet TI-RADS criteria for biopsy or dedicated follow-up. _________________________________________________________ Nodule # 3: Location: Left; inferior Maximum size: 3.0 cm; Other 2 dimensions: 2.8 x 2.4 cm Composition: solid/almost completely solid (2) Echogenicity: isoechoic (1) Shape: not taller-than-wide (0) Margins: smooth (0) Echogenic foci: none (0) ACR TI-RADS total points: 3. ACR TI-RADS risk category: TR3 (3 points). ACR TI-RADS recommendations: **Given size (>/= 2.5 cm) and appearance, fine needle aspiration of this mildly suspicious nodule should be considered based on TI-RADS criteria. This nodule corresponds to the abnormality seen on recent CT angiography. _________________________________________________________ IMPRESSION: 1. Nodule 3 (TI-RADS 3), measuring 3.0 cm, located in the inferior left thyroid lobe, meets criteria for FNA. 2. Nodules 1 meets criteria for imaging follow-up. Annual ultrasound surveillance is recommended until 5 years of stability is documented. The above is in keeping with the ACR TI-RADS recommendations - J Am Coll Radiol 2017;14:587-595. Electronically Signed   By: Acquanetta Belling M.D.   On: 10/25/2023 08:58     Labs:  Basic Metabolic Panel:    Latest Ref Rng & Units 11/11/2023    5:28 AM 11/04/2023    5:06 PM 11/03/2023    5:21 AM  BMP  Glucose 70 - 99 mg/dL 94  295  96   BUN 8 - 23 mg/dL 13   15   Creatinine 2.84 - 1.00 mg/dL 1.32   4.40   Sodium 102 - 145 mmol/L 135   133   Potassium 3.5 - 5.1 mmol/L 4.6   4.4   Chloride 98 - 111 mmol/L 102   101   CO2 22 - 32 mmol/L 27   24   Calcium 8.9 - 10.3 mg/dL 9.3   9.2      CBC:    Latest Ref Rng & Units 11/11/2023  5:28 AM 11/03/2023    5:21 AM 10/30/2023    6:58 AM  CBC  WBC 4.0 - 10.5 K/uL 9.2  9.7  9.8   Hemoglobin 12.0 - 15.0 g/dL 16.1  09.6  04.5   Hematocrit 36.0 - 46.0 % 39.6  37.9  39.7   Platelets 150 - 400 K/uL 341  326   340      CBG: No results for input(s): "GLUCAP" in the last 168 hours.  Brief HPI:   Jordan Collier is a 78 y.o. LH-female with history of hypercholesterolemia, SHP, psoriasis who was admitted to [facility] on 10/24/2023 with acute onset RLE weakness.  CTA head/neck was negative for LVO and incidental left thyroid nodule noted.  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  Thyroid ultrasound done for follow-up and showed 3 mm left thyroid nodule meeting criteria for FNA and 1 meeting criteria for yearly surveillance.  Dr. Xu/neurology felt the stroke was due to small vessel disease and recommended DAPT x 3 weeks followed by aspirin alone.  Patient was noted to have right-sided weakness affecting mobility and ADLs.  She was requiring mod assist overall.  She was independent and working prior to admission.  CIR was recommended to function decline.   Hospital Course: Ruthene Woodhull was admitted to rehab 10/28/2023 for inpatient therapies to consist of PT and OT at least three hours five days a week. Past admission physiatrist, therapy team and rehab RN have worked together to provide customized collaborative inpatient rehab. Her blood pressures were monitored on TID basis and has been controlled.  She has had some waxing and waning of sensory symptoms in RLE and was educated that this was characteristic of the thalamic stroke and showed improved.  She was maintained on DAPT during her stay with follow-up CBC showing H&H and platelets to be stable.  She is to continue on Plavix for 5 more days then stop.   Pain has been controlled with as needed use of Tylenol.  Serial check of BMET showed that hyponatremia has resolved and renal status is stable.  Ensure supplements were added due to low protein stores.  Vitamin D 3000 units was added due to vitamin D insufficiency.  She developed left rib pain with mild  erythema suspicious for shingles and was treated with Valtrex x 7 days per pharmacy input to prevent shingles outbreak. Intake has been good and patient is continent of B/B. She has made good gains during her stay and is independent at discharge. She was discharged to [facility] per protocol for follow up therapy and to assist with transition to her home setting.   Rehab course: During patient's stay in rehab weekly team conferences were held to monitor patient's progress, set goals and discuss barriers to discharge. At admission, patient required mod assist with basic ADL tasks and with mobility. She  has had improvement in activity tolerance, balance, postural control as well as ability to compensate for deficits. She has had improvement in functional use RUE   and RLE as well as improvement in awareness. She is able to complete ADL tasks at modified independent level. She is independent for transfers and is able to ambulate thousand plus feet with use of rollator.  She requires supervision to climb 12 stairs.    Discharge disposition: 03-Skilled Nursing Facility  Diet: Heart Healthy  Special Instructions: Recommend FNA left thyroid nodule #3 and serial yearly  follow up of nodule #1 per recommendations (see report above).  2.  Stop Plavix after 4 days.  Allergies as of 11/12/2023       Reactions   Decadron [dexamethasone] Rash   Molds & Smuts Other (See Comments)   Mucus over production   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 Other (See Comments)   Mucus over production   Penicillins Other (See Comments)   Unknown reaction Childhood allergy   Sulfa Antibiotics Other (See Comments)   Unknown reaction Childhood allergy   Lanolin Swelling   Lip swelling when using lip balm containing lanolin.   Statins Other (See Comments)   Myalgias Vision changes Severe constipation        Medication List     STOP taking these medications    acetaminophen 500 MG tablet Commonly known as:  TYLENOL   ondansetron 4 MG/2ML Soln injection Commonly known as: ZOFRAN   sodium chloride 5 % ophthalmic solution Commonly known as: MURO 128       TAKE these medications    aspirin EC 81 MG tablet Take 1 tablet (81 mg total) by mouth daily. Swallow whole.   clopidogrel 75 MG tablet Commonly known as: PLAVIX Take 1 tablet (75 mg total) by mouth daily for 4 days.   ezetimibe 10 MG tablet Commonly known as: ZETIA Take 1 tablet (10 mg total) by mouth daily.   KLS Aller-Tec 10 MG tablet Generic drug: cetirizine Take 10 mg by mouth daily as needed for allergies (sinus congestion).   mineral oil-hydrophilic petrolatum ointment Apply 1 Application topically as needed (lip irritation).   mouth rinse Liqd solution 15 mLs by Mouth Rinse route as needed (for oral care).   Multivitamin Women 50+ Tabs Take 1 tablet by mouth daily.   senna-docusate 8.6-50 MG tablet Commonly known as: Senokot-S Take 1 tablet by mouth at bedtime as needed for mild constipation or moderate constipation.   valACYclovir 1000 MG tablet Commonly known as: VALTREX Take 2,000 mg by mouth See admin instructions. Take 2 tablets (2000mg ) twice daily for 1 day with onset of cold sores.   VITAMIN B-12 PO Take 0.5 tablets by mouth daily.   VITAMIN D-3 PO Take 1 tablet by mouth daily.   white petrolatum Oint Commonly known as: VASELINE Apply 1 Application topically as needed for dry skin (to nares).        Contact information for follow-up providers     Raulkar, Drema Pry, MD Follow up.   Specialty: Physical Medicine and Rehabilitation Why: office will call you with follow up appointment Contact information: [address] [place] [address] [phone]         [facility] Follow up.   Why: office will call you with follow up appointment Contact information: [address]     [address] [place] [address] [phone]              Contact information for after-discharge care     Destination     [facility] .   Service: Skilled Nursing Contact information: [address] [place] [address] [phone]                     Signed: Jacquelynn Cree 11/12/2023, 1:06 PM

## 2023-11-12 ENCOUNTER — Other Ambulatory Visit (HOSPITAL_COMMUNITY): Payer: Self-pay

## 2023-11-12 ENCOUNTER — Telehealth: Payer: Self-pay

## 2023-11-12 ENCOUNTER — Encounter: Payer: Self-pay | Admitting: Neurology

## 2023-11-12 MED ORDER — EZETIMIBE 10 MG PO TABS
10.0000 mg | ORAL_TABLET | Freq: Every day | ORAL | 0 refills | Status: AC
  Filled 2023-11-12: qty 30, 30d supply, fill #0

## 2023-11-12 MED ORDER — EZETIMIBE 10 MG PO TABS
10.0000 mg | ORAL_TABLET | Freq: Every day | ORAL | 0 refills | Status: DC
  Filled 2023-11-12: qty 30, 30d supply, fill #0

## 2023-11-12 NOTE — Progress Notes (Signed)
Occupational Therapy Discharge Summary  Patient Details  Name: Jordan Collier MRN: [id number] Date of Birth: [birth year]  Date of Discharge from OT service:November 11, 2023   Patient has met 10 of 10 long term goals due to improved activity tolerance, improved balance, postural control, ability to compensate for deficits, functional use of  RIGHT upper and RIGHT lower extremity, and improved coordination.  Patient to discharge at overall Modified Independent level.  Patient transitioning to SNF briefly before returning to ILF to allow modifications to her shower.    Reasons goals not met: NA  Recommendation:  Patient will benefit from ongoing skilled OT services in outpatient setting to continue to advance functional skills in the area of BADL, iADL, and Vocation.  Equipment: Recommended shower seat, grab bar  Reasons for discharge: treatment goals met and discharge from hospital  Patient/family agrees with progress made and goals achieved: Yes  OT Discharge Precautions/Restrictions  Precautions Precautions: Fall Precaution Comments: Rt hemiparesis Restrictions Weight Bearing Restrictions Per Provider Order: No   Pain Pain Assessment Pain Score: 0-No pain ADL ADL Eating: Independent Where Assessed-Eating: Chair Grooming: Independent Where Assessed-Grooming: Sitting at sink, Standing at sink Upper Body Bathing: Modified independent Where Assessed-Upper Body Bathing: Edge of bed Lower Body Bathing: Modified independent Where Assessed-Lower Body Bathing: Sitting at sink, Standing at sink Upper Body Dressing: Independent Where Assessed-Upper Body Dressing: Sitting at sink Lower Body Dressing: Modified independent Where Assessed-Lower Body Dressing: Chair Toileting: Modified independent Where Assessed-Toileting: Neurosurgeon Method: Proofreader: Bedside commode, Other (comment) (RW) Tub/Shower  Transfer: Unable to assess Tub/Shower Transfer Method: Unable to assess Film/video editor: Modified independent Film/video editor Method: Designer, industrial/product: Sales promotion account executive Baseline Vision/History: 1 Wears glasses Patient Visual Report: No change from baseline Vision Assessment?: No apparent visual deficits Eye Alignment: Within Functional Limits Ocular Range of Motion: Within Functional Limits Alignment/Gaze Preference: Within Defined Limits Saccades: Within functional limits Convergence: Within functional limits Visual Fields: No apparent deficits Perception  Perception: Within Functional Limits Praxis Praxis: WFL Cognition Cognition Overall Cognitive Status: Within Functional Limits for tasks assessed Arousal/Alertness: Awake/alert Orientation Level: Person;Place;Situation Person: Oriented Place: Oriented Situation: Oriented Memory: Appears intact Attention: Alternating Focused Attention: Appears intact Sustained Attention: Appears intact Selective Attention: Appears intact Awareness: Appears intact Problem Solving: Appears intact Sequencing: Appears intact Decision Making: Appears intact Initiating: Appears intact Safety/Judgment: Appears intact Brief Interview for Mental Status (BIMS) Repetition of Three Words (First Attempt): 3 Temporal Orientation: Year: Correct Temporal Orientation: Month: Accurate within 5 days Temporal Orientation: Day: Correct Recall: "Sock": Yes, no cue required Recall: "Blue": Yes, no cue required Recall: "Bed": Yes, no cue required BIMS Summary Score: 15 Sensation Sensation Light Touch: Appears Intact Hot/Cold: Appears Intact Proprioception: Appears Intact Stereognosis: Appears Intact Additional Comments: Mild proprioception deficits on RLE Coordination Gross Motor Movements are Fluid and Coordinated: No Fine Motor Movements are Fluid and Coordinated: No Coordination and Movement  Description: R hemi Motor  Motor Motor: Hemiplegia Mobility  Bed Mobility Bed Mobility: Supine to Sit;Sit to Supine Supine to Sit: Independent with assistive device Sit to Supine: Independent with assistive device Transfers Sit to Stand: Independent with assistive device Stand to Sit: Independent with assistive device  Trunk/Postural Assessment  Cervical Assessment Cervical Assessment: Within Functional Limits Thoracic Assessment Thoracic Assessment: Within Functional Limits Lumbar Assessment Lumbar Assessment: Within Functional Limits Postural Control Postural Control: Within Functional Limits  Balance Balance Balance Assessed: Yes Static Sitting Balance Static Sitting - Balance  Support: Feet supported;No upper extremity supported Static Sitting - Level of Assistance: 7: Independent Dynamic Sitting Balance Dynamic Sitting - Balance Support: Feet supported;No upper extremity supported Dynamic Sitting - Level of Assistance: 7: Independent Static Standing Balance Static Standing - Balance Support: Right upper extremity supported;Left upper extremity supported;During functional activity Static Standing - Level of Assistance: 6: Modified independent (Device/Increase time) Dynamic Standing Balance Dynamic Standing - Balance Support: Left upper extremity supported;Right upper extremity supported;During functional activity Dynamic Standing - Level of Assistance: 6: Modified independent (Device/Increase time) Extremity/Trunk Assessment RUE Assessment RUE Assessment: Exceptions to [facility] Active Range of Motion (AROM) Comments: still demonstrates proximal weakness with overhead reach General Strength Comments: 4-/5 RUE Body System: Neuro Brunstrum levels for arm and hand: Arm;Hand Brunstrum level for arm: Stage V Relative Independence from Synergy Brunstrum level for hand: Stage VI Isolated joint movements LUE Assessment LUE Assessment: Within Functional Limits   [name] 11/12/2023, 3:55 PM

## 2023-11-12 NOTE — Progress Notes (Signed)
Inpatient Rehabilitation Discharge Medication Review by a Pharmacist  A complete drug regimen review was completed for this patient to identify any potential clinically significant medication issues.  High Risk Drug Classes Is patient taking? Indication by Medication  Antipsychotic No   Anticoagulant No   Antibiotic No   Opioid No   Antiplatelet Yes Aspirin 81 mg and Clopidogrel x 3 weeks, then Aspirin alone - CVA prophylaxis  Hypoglycemics/insulin No   Vasoactive Medication No   Chemotherapy No   Other Yes Ezetimibe - hyperlipidemia Vitamin D, Vitamin B-12, MVI - supplements  PRN: Cetirizine - allergies Senna-docusate - constipation Valacyclovir - begin course for onset cold sores Aquaphor - lip irritation White petrolatum - dry skin (nares) Mouth rinse - oral care     Type of Medication Issue Identified Description of Issue Recommendation(s)  Drug Interaction(s) (clinically significant)     Duplicate Therapy     Allergy     No Medication Administration End Date     Incorrect Dose     Additional Drug Therapy Needed     Significant med changes from prior encounter (inform family/care partners about these prior to discharge). New Aspirin, Ezetemibe. Clopidogrel x 3 weeks, stop date is in place. Communicate changes with patient/family prior to discharge.  Other       Clinically significant medication issues were identified that warrant physician communication and completion of prescribed/recommended actions by midnight of the next day:  No  Pharmacist comments:  - May begin Leqvio for hyperlipidemia as outpatient, pending approval  Time spent performing this drug regimen review (minutes):  [address]   Dennie Fetters, [place] 11/12/2023 7:53 AM

## 2023-11-12 NOTE — Telephone Encounter (Signed)
Dr. Roda Shutters, patient will be scheduled as soon as possible.  Auth Submission: NO AUTH NEEDED Site of care: Site of care: [facility] Payer: BCBS medicare Medication & CPT/J Code(s) submitted: Leqvio (Inclisiran) [id number] Route of submission (phone, fax, portal): phone Phone # [phone] Fax # Auth type: Buy/Bill PB Units/visits requested: 284mg  x 3 doses Reference number: [id number] Approval from: 11/12/23 to 10/20/24

## 2023-11-12 NOTE — Progress Notes (Signed)
PROGRESS NOTE   Subjective/Complaints: Pt feeling well. Excited to be heading home today. Will to rehab part of facility briefly before going back to ILF. Feels prepared  ROS: Patient denies fever, rash, sore throat, blurred vision, dizziness, nausea, vomiting, diarrhea, cough, shortness of breath or chest pain, joint or back/neck pain, headache, or mood change.   Objective:   No results found. Recent Labs    11/11/23 0528  WBC 9.2  HGB 13.1  HCT 39.6  PLT 341     Recent Labs    11/11/23 0528  NA 135  K 4.6  CL 102  CO2 27  GLUCOSE 94  BUN 13  CREATININE 0.73  CALCIUM 9.3      Intake/Output Summary (Last 24 hours) at 11/12/2023 0847 Last data filed at 11/11/2023 1758 Gross per 24 hour  Intake 480 ml  Output --  Net 480 ml        Physical Exam: Vital Signs Blood pressure (!) 112/57, pulse 60, temperature (!) 97.5 F (36.4 C), resp. rate 17, height 5\' 8"  (1.727 m), weight 67.7 kg, SpO2 98%. Constitutional: No distress . Vital signs reviewed. HEENT: NCAT, EOMI, oral membranes moist Neck: supple Cardiovascular: RRR without murmur. No JVD    Respiratory/Chest: CTA Bilaterally without wheezes or rales. Normal effort    GI/Abdomen: BS +, non-tender, non-distended Ext: no clubbing, cyanosis, or edema Psych: pleasant and cooperative  Musculoskeletal:        General: No swelling or tenderness. Normal range of motion.     Cervical back: Normal range of motion.  Skin:    General: Warm and dry Neurological:     Mental Status: She is alert.     Comments: Alert and oriented x 3. Normal insight and awareness. Intact Memory. Normal language and speech. Cranial nerve exam unremarkable except for right facial sensory loss.. MMT: RUE is 4/5 prox to distal. RLE 4/5 throughout. LUE and LLE 4+ to 5/5. Ongoing sensory loss on right but improved, dexterity better  Assessment/Plan: 1. Functional deficits which require 3+  hours per day of interdisciplinary therapy in a comprehensive inpatient rehab setting. Physiatrist is providing close team supervision and 24 hour management of active medical problems listed below. Physiatrist and rehab team continue to assess barriers to discharge/monitor patient progress toward functional and medical goals  Care Tool:  Bathing    Body parts bathed by patient: Right arm, Left arm, Chest, Abdomen, Front perineal area, Right upper leg, Left upper leg, Face, Right lower leg, Left lower leg, Buttocks   Body parts bathed by helper: Buttocks, Right lower leg, Left lower leg     Bathing assist Assist Level: Supervision/Verbal cueing     Upper Body Dressing/Undressing Upper body dressing   What is the patient wearing?: Pull over shirt    Upper body assist Assist Level: Set up assist    Lower Body Dressing/Undressing Lower body dressing      What is the patient wearing?: Underwear/pull up, Pants     Lower body assist Assist for lower body dressing: Set up assist     Toileting Toileting    Toileting assist Assist for toileting: Supervision/Verbal cueing     Transfers  Chair/bed transfer  Transfers assist     Chair/bed transfer assist level: Independent with assistive device Chair/bed transfer assistive device: Armrests, Geologist, engineering   Ambulation assist      Assist level: Independent with assistive device Assistive device: Rollator Max distance: 200+   Walk 10 feet activity   Assist     Assist level: Independent with assistive device Assistive device: Rollator   Walk 50 feet activity   Assist Walk 50 feet with 2 turns activity did not occur: Safety/medical concerns (RLE weakness)  Assist level: Independent with assistive device Assistive device: Rollator    Walk 150 feet activity   Assist Walk 150 feet activity did not occur: Safety/medical concerns  Assist level: Independent with assistive device Assistive  device: Rollator    Walk 10 feet on uneven surface  activity   Assist Walk 10 feet on uneven surfaces activity did not occur: Safety/medical concerns   Assist level: Independent with assistive device Assistive device: Rollator   Wheelchair     Assist Is the patient using a wheelchair?: No Type of Wheelchair: Manual    Wheelchair assist level: Total Assistance - Patient < 25% Max wheelchair distance: 5ft    Wheelchair 50 feet with 2 turns activity    Assist        Assist Level: Total Assistance - Patient < 25%   Wheelchair 150 feet activity     Assist      Assist Level: Total Assistance - Patient < 25%   Blood pressure (!) 112/57, pulse 60, temperature (!) 97.5 F (36.4 C), resp. rate 17, height 5\' 8"  (1.727 m), weight 67.7 kg, SpO2 98%.  Medical Problem List and Plan: 1. 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              -patient may  shower             -dc today to [facility] where she will transition from their SNF to ILF.     -needs f/u with primary, neuro and Dr. Carlis Abbott  2.  Antithrombotics: -ambulatory, off lovenox now             -antiplatelet therapy: continue plavix and 81mg  asa for 3 weeks then asa alone.   3. Pain Management: Tylenol prn.  4. Insomnia: well controlled, d/c melatonin  5. Neuropsych/cognition: This patient is capable of making decisions on her own behalf. 6. Skin/Wound Care: Routine pressure relief measures.   7. Hyponatremia: improved  -1/21 Sodium 135    -encouraged balanced, healthy diet 8. Blood pressure: well controlled     11/12/2023    4:41 AM 11/11/2023    8:33 PM 11/11/2023    1:32 PM  Vitals with BMI  Systolic 112 115 657  Diastolic 57 64 54  Pulse 60 66 77   -1/22 controlled continue current regimen   9. Hypercholesterolemia: LDL-196. continue Zetia and Leqvio pending approval. Discussed that she had prior negative side effects  to statin.  10. Hyperglycemia: discussed that CBGs are lwell controlled can d/c CBG checks  Glucose 94 1/21  11. Low protein: educated patient regarding levels, Ensure ordered q24H prn   12. Vitamin D insufficiency: D3 ordered, continue 3,000U daily  13. Constipation: d/c bisacodyl suppository.  -LBM 1/19, improved  14.  Left rib area pain.  Slight redness noted.  Concern for shingles  -off valtrex since completed course \ 16. Dry eyes: placed order that she may use  her home eye drops   LOS: 15 days A FACE TO FACE EVALUATION WAS PERFORMED  Ranelle Oyster 11/12/2023, 8:47 AM

## 2023-11-13 ENCOUNTER — Telehealth: Payer: Self-pay

## 2023-11-13 NOTE — Telephone Encounter (Signed)
Transition Care Management Unsuccessful Follow-up Telephone Call  Date of discharge and from where:  11/12/2023 from inpatient rehab unit  Attempts:  1st Attempt  Reason for unsuccessful TCM follow-up call:  Left voice message

## 2023-11-13 NOTE — Telephone Encounter (Signed)
Transition Care Management Unsuccessful Follow-up Telephone Call  Date of discharge and from where:  Patient discharged from [facility] inpatient rehab unit on 11/12/23  Attempts:  2nd Attempt  Reason for unsuccessful TCM follow-up call:  Left voice message

## 2023-11-14 ENCOUNTER — Other Ambulatory Visit (HOSPITAL_BASED_OUTPATIENT_CLINIC_OR_DEPARTMENT_OTHER): Payer: Self-pay

## 2023-11-17 ENCOUNTER — Encounter: Payer: Self-pay | Admitting: Neurology

## 2023-11-24 ENCOUNTER — Encounter: Payer: Medicare Other | Attending: Registered Nurse | Admitting: Registered Nurse

## 2023-11-24 ENCOUNTER — Encounter: Payer: Self-pay | Admitting: Registered Nurse

## 2023-11-24 VITALS — BP 132/73 | HR 72 | Ht 68.0 in | Wt 154.0 lb

## 2023-11-24 DIAGNOSIS — E7849 Other hyperlipidemia: Secondary | ICD-10-CM | POA: Diagnosis present

## 2023-11-24 DIAGNOSIS — I6381 Other cerebral infarction due to occlusion or stenosis of small artery: Secondary | ICD-10-CM | POA: Diagnosis present

## 2023-11-24 NOTE — Progress Notes (Unsigned)
Subjective:    Patient ID: Jordan Collier, female    DOB: Jun 10, 1946, 78 y.o.   MRN: [id number]  HPI: Jordan Collier is a 78 y.o. female who is scheduled for HFU appointment for F/U of her  T Jordan Collier was brought to [facility] via EMS  on 10/24/2023 for acute onset of Right Lower Extremity Weakness.  Dr. Derry Lory Note:  Jordan Collier is a 78 y.o. female with history of hypercholesterolemia, herpes simplex, cataract who presents with sudden onset right-sided weakness.  Patient reports that she was at work, she stepped outside to take a break and sat in her car at 10 AM this morning.  When she tried to get out of her car at 1015, she noted that her right leg was giving her significant trouble.  She thought initially that she sat on it and had a pinched nerve but when the symptoms persisted, she called EMS and was brought in as a code stroke with right-sided weakness.   She denies any prior history of strokes, no family history of strokes, denies any headaches.  Her symptoms were persistent and in fact she had some worsening of her right facial droop while she is within the ED.   She had CT head without contrast was negative for any acute intracranial normalities.  We tried to set patient up and try to get her to walk to assess how disabling her symptoms were.  It was noted the patient was having significant trouble with even sitting up and was leaning to her right and could not stand up or take a step.  Given the disabling nature of her symptoms, she was offered TNKase.  I discussed extensively with her the risks and benefits of TNKase and his mode of administration.  I discussed with patient about 30% chance of improving her symptoms about 3 to 5% risk of bleeding in her brain which could result in worsening of her symptoms or even death.   Patient understands risks and benefits of TNKase and consented to administration of TNKase.  CT Head: WO Contrast:  IMPRESSION: No  hemorrhage or CT evidence of an acute cortical infarct. Aspects is 10. CTA:  IMPRESSION: 1. No intracranial large vessel occlusion. 2. No hemodynamically significant stenosis in the neck. 3. There is a 2.4 x 2.0 cm left thyroid nodule. Recommend further evaluation with a dedicated thyroid ultrasound.   MR: Brain: WO Contrast:  MPRESSION: 1. Acute left thalamocapsular infarct. 2. 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 disease.   Neurology recommended DAPT x 3 weeks followed by aspirin alone.   Jordan Collier was admitted to inpatient rehabilitation on 10/28/2023 and discharged home on 11/12/2023. She is receiving home health therapy with [facility], Jordan Collier would prefer to receive Outpatient therapy, referral placed to [facility], she verbalizes understanding.   Pain Inventory Average Pain 0 Pain Right Now 0 My pain is  No pain  LOCATION OF PAIN  No pain  BOWEL Number of stools per week: 7  Oral laxative use  Hot lemon water, or prune juice Type of laxative None   BLADDER Normal and Pads Leakage with coughing Yes    Mobility walk with assistance use a Collier how many minutes can you walk? 1000 steps in 6 minutes ability to climb steps?  yes do you drive?  no Do you have any goals in this area?  yes  Function employed # of hrs/week Currently on FMLA  I need assistance with the following:  [place] takes care of needed household duties. Do you have any goals in this area?  yes  Neuro/Psych bladder control problems weakness numbness trouble walking spasms  Prior Studies Any changes since last visit?  no  Physicians involved in your care Any changes since last visit?  yes Dr. Tonie [facility]   Family History  Problem Relation Age of Onset   Hyperlipidemia Mother    Diabetes Mother    Cancer Father    Seizures Son    Social History   Socioeconomic History   Marital status: Divorced     Spouse name: Not on file   Number of children: 2   Years of education: Not on file   Highest education level: Not on file  Occupational History   Occupation: Greeter    Employer: [id number]  Tobacco Use   Smoking status: Former    Current packs/day: 0.00    Types: Cigarettes    Quit date: 10/1999    Years since quitting: 24.1   Smokeless tobacco: Never  Vaping Use   Vaping status: Never Used  Substance and Sexual Activity   Alcohol use: Not Currently   Drug use: Not Currently   Sexual activity: Not Currently  Other Topics Concern   Not on file  Social History Narrative   Not on file   Social Drivers of Health   Financial Resource Strain: Not on file  Food Insecurity: Patient Declined (10/24/2023)   Hunger Vital Sign    Worried About Running Out of Food in the Last Year: Patient declined    Ran Out of Food in the Last Year: Patient declined  Transportation Needs: No Transportation Needs (10/24/2023)   PRAPARE - Administrator, Civil Service (Medical): No    Lack of Transportation (Non-Medical): No  Physical Activity: Not on file  Stress: Not on file  Social Connections: Unknown (10/24/2023)   Social Connection and Isolation Panel [NHANES]    Frequency of Communication with Friends and Family: More than three times a week    Frequency of Social Gatherings with Friends and Family: More than three times a week    Attends Religious Services: Patient declined    Database administrator or Organizations: No    Attends Engineer, structural: Never    Marital Status: Divorced   Past Surgical History:  Procedure Laterality Date   TRIGGER FINGER RELEASE Right 01/2023   TUBAL LIGATION N/A 1980   Past Medical History:  Diagnosis Date   Allergy to mold    Hypercholesterolemia    Psoriasis    Trigger finger, right middle finger    Ht 5\' 8"  (1.727 m)   Wt 154 lb (69.9 kg)   BMI 23.42 kg/m   Opioid Risk Score:   Fall Risk Score:  `1  Depression screen PHQ  2/9      No data to display          Review of Systems  Musculoskeletal:  Positive for gait problem.       Spasms  Neurological:  Positive for weakness and numbness.  All other systems reviewed and are negative.      Objective:   Physical Exam Vitals and nursing note reviewed.  Constitutional:      Appearance: Normal appearance.  Cardiovascular:     Rate and Rhythm: Normal rate and regular rhythm.     Pulses: Normal pulses.     Heart sounds: Normal heart sounds.  Pulmonary:  Effort: Pulmonary effort is normal.     Breath sounds: Normal breath sounds.  Musculoskeletal:     Comments: Normal Muscle Bulk and Muscle Testing Reveals:  Upper Extremities: Full ROM and Muscle Strength 4/5  Lower Extremities: Full ROM and Muscle Strength 5/5 Arises from Table slowly using Collier for support Narrow Based  Gait     Skin:    General: Skin is warm and dry.  Neurological:     Mental Status: She is alert and oriented to person, place, and time.  Psychiatric:        Mood and Affect: Mood normal.        Behavior: Behavior normal.         Assessment & Plan:  1.Thalamic [place]: Referral Placed for Neuro Rehabilitation. She has a scheduled appointment with Neurology. Continue medication as prescribed. Continue to Monitor.  2. Hyperlipidemia: Continue current medication regimen. Continue to monitor. .  F/U with Dr Carlis Abbott in 4- 6 weeks

## 2023-11-24 NOTE — Patient Instructions (Signed)
[facility]  :    [address], [place], [place] [address] Hours:  Open ? Closes 5?PM Updated by this business 3 weeks ago Phone: [phone]

## 2023-11-26 ENCOUNTER — Other Ambulatory Visit (HOSPITAL_COMMUNITY): Payer: Self-pay

## 2023-11-26 ENCOUNTER — Other Ambulatory Visit: Payer: Self-pay

## 2023-11-26 ENCOUNTER — Other Ambulatory Visit (HOSPITAL_BASED_OUTPATIENT_CLINIC_OR_DEPARTMENT_OTHER): Payer: Self-pay

## 2023-11-26 MED ORDER — PRAVASTATIN SODIUM 10 MG PO TABS
10.0000 mg | ORAL_TABLET | Freq: Every day | ORAL | 1 refills | Status: DC
  Filled 2023-11-26 (×2): qty 90, 90d supply, fill #0

## 2023-11-26 MED ORDER — EZETIMIBE 10 MG PO TABS
10.0000 mg | ORAL_TABLET | Freq: Every day | ORAL | 1 refills | Status: AC
  Filled 2023-11-26 (×2): qty 90, 90d supply, fill #0
  Filled 2024-06-24: qty 90, 90d supply, fill #1

## 2023-11-27 ENCOUNTER — Other Ambulatory Visit (HOSPITAL_COMMUNITY): Payer: Self-pay

## 2023-11-27 ENCOUNTER — Other Ambulatory Visit: Payer: Self-pay

## 2023-11-27 ENCOUNTER — Other Ambulatory Visit (HOSPITAL_BASED_OUTPATIENT_CLINIC_OR_DEPARTMENT_OTHER): Payer: Self-pay

## 2023-11-28 ENCOUNTER — Other Ambulatory Visit (HOSPITAL_COMMUNITY): Payer: Self-pay

## 2023-11-28 ENCOUNTER — Other Ambulatory Visit: Payer: Self-pay

## 2023-12-01 NOTE — Therapy (Addendum)
OUTPATIENT OCCUPATIONAL THERAPY NEURO EVALUATION  Patient Name: Jordan Collier MRN: [id number] DOB:[birth year], 78 y.o., female Today's Date: 12/02/2023  PCP: Dr. Harrison Mons REFERRING PROVIDER: Jacalyn Lefevre, NP/ Dr. Carlis Abbott  END OF SESSION:  OT End of Session - 12/02/23 1358     Visit Number 1    Number of Visits 25    Date for OT Re-Evaluation 02/24/24    Authorization Type BCBS MCR    Authorization - Visit Number 1    Progress Note Due on Visit 10    OT Start Time 1102    OT Stop Time 1144    OT Time Calculation (min) 42 min    Activity Tolerance Patient tolerated treatment well    Behavior During Therapy WFL for tasks assessed/performed             Past Medical History:  Diagnosis Date   Allergy to mold    Hypercholesterolemia    Psoriasis    Trigger finger, right middle finger    Past Surgical History:  Procedure Laterality Date   TRIGGER FINGER RELEASE Right 01/2023   TUBAL LIGATION N/A 1980   Patient Active Problem List   Diagnosis Date Noted   History of shingles 11/11/2023   Vitamin D insufficiency 11/04/2023   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   Hyperlipemia 10/27/2023   Stroke determined by clinical assessment (HCC) 10/24/2023    ONSET DATE: 10/24/23  REFERRING DIAG:  Diagnosis  I63.81 (ICD-10-CM) - Thalamic stroke (HCC)    THERAPY DIAG:  Muscle weakness (generalized) - Plan: Ot plan of care cert/re-cert  Other abnormalities of gait and mobility - Plan: Ot plan of care cert/re-cert  Other lack of coordination - Plan: Ot plan of care cert/re-cert  Unsteadiness on feet - Plan: Ot plan of care cert/re-cert  Frontal lobe and executive function deficit - Plan: Ot plan of care cert/re-cert  Rationale for Evaluation and Treatment: Rehabilitation  SUBJECTIVE:   SUBJECTIVE STATEMENT: Pt reports she wants to return to work Pt accompanied by: self  PERTINENT HISTORY: Pt is a 78 y/o female presenting 1/3 with sudden onset of R sided weakness.   TNK administered.  MRI (+) Acute left thalamocapsular infarct. PMH; hyperlipidemia       PRECAUTIONS: Fall  WEIGHT BEARING RESTRICTIONS: No  PAIN:no pain at time of eval Are you having pain? Yes: NPRS scale: moderate pain over last week Pain location: RUE Pain description: aching Aggravating factors: malpositioning Relieving factors: repositioning      FALLS: Has patient fallen in last 6 months? No  LIVING ENVIRONMENT: Lives with: lives alone Lives in: House/apartment Stairs: No, has elevator Has following equipment at home:  rollator  PLOF: Independent  PATIENT GOALS: improve function in RUE  OBJECTIVE:  Note: Objective measures were completed at Evaluation unless otherwise noted.  HAND DOMINANCE: LUE  ADLs: Overall ADLs: modified Independent Transfers/ambulation related to ADLs:mod I with rollator  Tub Shower transfers: walk in shower with seat Equipment:  rollator  IADLs:  Meal Prep: has cooked 1 meal however she is able to go dining room/ restaurant at [employer]   MOBILITY STATUS:  mod I with rollator    ACTIVITY TOLERANCE: Activity tolerance: fatigues more quickly   FUNCTIONAL OUTCOME MEASURES: Quick Dash: 36% disability  UPPER EXTREMITY ROM:  WFLS for LUE, grossly WFLs for RUE with exception of internal rotation/ difficulty reaching behind back   Active ROM Right eval Left eval  Shoulder flexion    Shoulder abduction    Shoulder adduction  Shoulder extension    Shoulder internal rotation  grossly 75%  Shoulder external rotation    Elbow flexion    Elbow extension    Wrist flexion    Wrist extension    Wrist ulnar deviation    Wrist radial deviation    Wrist pronation    Wrist supination    (Blank rows = not tested)  UPPER EXTREMITY MMT:     MMT Right eval Left eval  Shoulder flexion 3+/5 4+/5  Shoulder abduction    Shoulder adduction    Shoulder extension    Shoulder internal rotation    Shoulder external rotation     Middle trapezius    Lower trapezius    Elbow flexion 4/5 4+/5  Elbow extension 4/5 4+/5  Wrist flexion    Wrist extension    Wrist ulnar deviation    Wrist radial deviation    Wrist pronation    Wrist supination    (Blank rows = not tested)  HAND FUNCTION: Grip strength: Right: 30 lbs; Left: 45 lbs  COORDINATION: 9 Hole Peg test: Right: 35.17 sec; Left: 28.32 sec, Box/ blocks: RUE: 45 blocks, LUE 55 blocks  SENSATION: WFL    COGNITION: Overall cognitive status:  delayed processing per pt report. Pt spells"WORLD" backwards correctly, pt was able to correctly perfrom clock drawing task   VISION ASSESSMENT: Not tested- denies visual changes from CVA      OBSERVATIONS: 78 y.o female is highly motivated to improve so she can return to work                                                                                                                             TREATMENT DATE: 12/02/23- eval completed , see education        PATIENT EDUCATION: Education details: role of occupational therapy, potential goals, verbal recommendations for fine motor coordination activities(flipping cards, stacking coins, picking up pennies, continued use of putty), beginning recommendations for home safety. Person educated: Patient Education method: Explanation Education comprehension: verbalized understanding  HOME EXERCISE PROGRAM: n/a   GOALS: Goals reviewed with patient? Yes  SHORT TERM GOALS: Target date: 12/29/23  I with inital HEP  Goal status: INITIAL  2.  Pt will demonstrate improved RUE functional use for ADLs as evidenced by increasing box/ blocks score by 4 blocks.  Goal status: INITIAL  3.   Pt. will demonstrate ability to perfrom a physical and cognitive task simultaneously  with 90% or better accuracy in prep for return to work and driving.  Goal status: INITIAL  4.  Pt will increase RUE grip strength by 5 lbs for increased RUE functional use.  Goal  status: INITIAL  5.  Pt will report increased ease with separating paper/ pages with RUE.  Goal status: INITIAL  6.  Pt will bathe her back modified indpendently with RUE.  Goal status: INITIAL  LONG TERM GOALS: Target date: 02/24/24  I with updated HEP.  Goal  status: INITIAL  2.  Pt will demonstrate improved fine motor coordination for ADLs as evidenced by decreasing RUE 9 hole peg test score by 3 secs.  Goal status: INITIAL  3.  Pt will perform simulated work activities modified independently.  Goal status: INITIAL  4.  Pt will report increased ease and accuracy with typing.  Goal status: INITIAL  5.  Pt will demonstrate ability to retrieve 3 lbs from overhead shelf with good control. Baseline:  Goal status: INITIAL   ASSESSMENT:  CLINICAL IMPRESSION: Patient is a 78 y.o. femle who was seen today for occupational therapy evaluation for thalamic CVA. Pt lives at [facility] in independent living apartment. 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 Pt started therapy at [facility] however she felt as though it was not CVA specific and aggressive enough for her  rehabilitative needs. Pt can benefit from skilled occupational therapy to address the performance deficits below, in order to maximize pt's safety and I with daily activities.  PERFORMANCE DEFICITS: in functional skills including ADLs, IADLs, coordination, dexterity, ROM, strength, pain, flexibility, Fine motor control, Gross motor control, mobility, balance, endurance, decreased knowledge of precautions, decreased knowledge of use of DME, and UE functional use, cognitive skills including attention, safety awareness, and thought, and psychosocial skills including coping strategies, environmental adaptation, habits, interpersonal interactions, and routines and behaviors.   IMPAIRMENTS: are limiting patient from ADLs, IADLs, work, play, leisure, and social participation.   CO-MORBIDITIES: may  have co-morbidities  that affects occupational performance. Patient will benefit from skilled OT to address above impairments and improve overall function.  MODIFICATION OR ASSISTANCE TO COMPLETE EVALUATION: No modification of tasks or assist necessary to complete an evaluation.  OT OCCUPATIONAL PROFILE AND HISTORY: Detailed assessment: Review of records and additional review of physical, cognitive, psychosocial history related to current functional performance.  CLINICAL DECISION MAKING: LOW - limited treatment options, no task modification necessary  REHAB POTENTIAL: Good  EVALUATION COMPLEXITY: Low    PLAN:  OT FREQUENCY: 2x/week  OT DURATION: 12 weeks, will likely d/c after 6-8 weeks dependent on progress.  PLANNED INTERVENTIONS: 97168 OT Re-evaluation, 97535 self care/ADL training, 84696 therapeutic exercise, 97530 therapeutic activity, 97112 neuromuscular re-education, 97140 manual therapy, 97116 gait training, 29528 aquatic therapy, 97035 ultrasound, 97018 paraffin, 41324 moist heat, 97010 cryotherapy, 97129 Cognitive training (first 15 min), 40102 Cognitive training(each additional 15 min), passive range of motion, balance training, stair training, functional mobility training, psychosocial skills training, energy conservation, coping strategies training, patient/family education, and DME and/or AE instructions  RECOMMENDED OTHER SERVICES: PT  CONSULTED AND AGREED WITH PLAN OF CARE: Patient  PLAN FOR NEXT SESSION: inital HEP for coordination, cane exercises vs light theraband, pt is highly motivated to improve and to return to work at [employer], OT 12/02/2023, 3:54 PM

## 2023-12-02 ENCOUNTER — Ambulatory Visit: Payer: Medicare Other | Attending: Registered Nurse

## 2023-12-02 ENCOUNTER — Ambulatory Visit: Payer: Medicare Other | Admitting: Occupational Therapy

## 2023-12-02 DIAGNOSIS — I6381 Other cerebral infarction due to occlusion or stenosis of small artery: Secondary | ICD-10-CM | POA: Diagnosis present

## 2023-12-02 DIAGNOSIS — R2689 Other abnormalities of gait and mobility: Secondary | ICD-10-CM | POA: Diagnosis present

## 2023-12-02 DIAGNOSIS — R2681 Unsteadiness on feet: Secondary | ICD-10-CM

## 2023-12-02 DIAGNOSIS — R278 Other lack of coordination: Secondary | ICD-10-CM | POA: Diagnosis present

## 2023-12-02 DIAGNOSIS — M6281 Muscle weakness (generalized): Secondary | ICD-10-CM | POA: Diagnosis present

## 2023-12-02 DIAGNOSIS — R41844 Frontal lobe and executive function deficit: Secondary | ICD-10-CM | POA: Insufficient documentation

## 2023-12-02 NOTE — Therapy (Signed)
 OUTPATIENT PHYSICAL THERAPY NEURO EVALUATION   Patient Name: Jordan Collier MRN: [id number] DOB:01/22/1946, 78 y.o., female Today's Date: 12/02/2023   PCP: Blenda Mounts REFERRING PROVIDER: Jacalyn Lefevre  END OF SESSION:  PT End of Session - 12/02/23 1139     Visit Number 1    Date for PT Re-Evaluation 02/24/24    Authorization Type BCBS    PT Start Time 1145    PT Stop Time 1230    PT Time Calculation (min) 45 min    Activity Tolerance Patient tolerated treatment well             Past Medical History:  Diagnosis Date   Allergy to mold    Hypercholesterolemia    Psoriasis    Trigger finger, right middle finger    Past Surgical History:  Procedure Laterality Date   TRIGGER FINGER RELEASE Right 01/2023   TUBAL LIGATION N/A 1980   Patient Active Problem List   Diagnosis Date Noted   History of shingles 11/11/2023   Vitamin D insufficiency 11/04/2023   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  Rationale for Evaluation and Treatment: Rehabilitation  SUBJECTIVE:                                                                                                                                                                                             SUBJECTIVE STATEMENT: I am okay, a little pooped. My dexterity is off, I don't feel like I have the balance like I used to when I left the hospital.   Pt accompanied by: self  PERTINENT HISTORY: [name] is a 78 y.o. female with history of hypercholesterolemia, herpes simplex, cataracts presenting 1/3 with acute onset of right-sided weakness. Patient called EMS after she had trouble getting out of her car due to her right leg weakness. CODE STROKE was activated by EMS.  Denies any hitroy of strokes,  headaches, does not take blood thinners. CTH negative. Patient was unable to walk on assessment, even leaning to right while trying to simply sit up. Management with thrombolytic therapy was explained to the patient, or patient's representative, as were risks, benefits and alternatives. All questions were answered. Patient, or patient's representative, expressed understanding of the treatment plan and agreed to proceed with thrombolytic treatment.  PAIN:  Are you having pain? No  PRECAUTIONS: Fall  RED FLAGS: None   WEIGHT BEARING RESTRICTIONS: No  FALLS: Has  patient fallen in last 6 months? Yes. Number of falls 1 fell in the [facility] driveway Sept 5th  LIVING ENVIRONMENT: Lives with: lives alone Lives in: House/apartment Stairs:  has both an Engineer, structural or can take the stairs, normally she used to take the stairs  Has following equipment at home: Dan Humphreys - 4 wheeled  PLOF: Independent and Independent with basic ADLs  PATIENT GOALS: to be able to drive, go back to work, and go up and down steps   OBJECTIVE:  Note: Objective measures were completed at Evaluation unless otherwise noted.  DIAGNOSTIC FINDINGS:  FINDINGS: Brain: There is a 1.5 cm acute infarct involving the lateral aspect of the left thalamus and adjacent posterior limb of the internal capsule. A single chronic microhemorrhage is noted in the right frontal white matter. T2 hyperintensities in the cerebral white matter bilaterally are nonspecific but compatible with mild-to-moderate chronic small vessel ischemic disease. There is mild generalized cerebral atrophy.   Vascular: Major intracranial vascular flow voids are preserved.   Skull and upper cervical spine: Unremarkable bone marrow signal.   Sinuses/Orbits: Bilateral cataract extraction. Paranasal sinuses and mastoid air cells are clear.   Other: None.   IMPRESSION: 1. Acute left thalamocapsular infarct. 2. 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  disease  COGNITION: Overall cognitive status: Within functional limits for tasks assessed   SENSATION: WFL  COORDINATION: Decreased coordination   POSTURE: rounded shoulders and forward head  LOWER EXTREMITY ROM:  all WFL    LOWER EXTREMITY MMT:  R hip flexion 3+/5 R knee extension 4-/5    TRANSFERS: Assistive device utilized: Environmental consultant - 4 wheeled  Sit to stand: CGA Stand to sit: SBA  STAIRS: Level of Assistance: CGA Stair Negotiation Technique: Step to Pattern with Single Rail on Right  GAIT: Gait pattern: step to pattern, decreased stance time- Right, decreased stride length, decreased hip/knee flexion- Right, ataxic, wide BOS, and poor foot clearance- Right Distance walked: in clinic distances Assistive device utilized: Environmental consultant - 4 wheeled Level of assistance: SBA   FUNCTIONAL TESTS:  5 times sit to stand: 19.81s Timed up and go (TUG): 16.25s Berg Balance Scale: 45/56                                                                                                                             TREATMENT DATE: 12/02/23- EVAL    PATIENT EDUCATION: Education details: POC, HEP, education about fall risk Person educated: Patient Education method: Explanation Education comprehension: verbalized understanding  HOME EXERCISE PROGRAM: Access Code: [email or web address] URL: [email or web address] Date: 12/02/2023 Prepared by: Cassie Freer  Exercises - Standing Tandem Balance with Counter Support  - 1 x daily - 7 x weekly - 2 sets - 10 reps - 15 hold - Standing Single Leg Stance with Counter Support  - 1 x daily - 7 x weekly - 2 sets - 10 reps - 10 hold - Standing Hip Abduction with Counter Support  - 1 x daily - 7  x weekly - 2 sets - 10 reps - Seated March with Resistance  - 1 x daily - 7 x weekly - 2 sets - 10 reps - Seated Hip Abduction with Resistance  - 1 x daily - 7 x weekly - 2 sets - 10 reps  GOALS: Goals reviewed with patient? Yes  SHORT TERM GOALS: Target  date: 01/13/24  Patient will be independent with initial HEP. Baseline: given 12/02/23 Goal status: INITIAL  2.  Patient will demonstrate decreased fall risk by scoring < 14 sec on TUG. Baseline: 16.25s without walker Goal status: INITIAL    LONG TERM GOALS: Target date: 02/24/24  Patient will be independent with advanced/ongoing HEP to improve outcomes and carryover.  Baseline:  Goal status: INITIAL  2.  Patient will be able to ambulate 500' with normalized gait pattern and good safety to access community.  Baseline: using rollator and some short distances without, decreased hip flexion on R LE Goal status: INITIAL  3.  Patient will be able to step up/down a flight of steps to safely to access apartment  Baseline: was using stairs in apt prior to stroke Goal status: INITIAL   4.  Patient will demonstrate improved functional LE strength as demonstrated by 5xSTS <14s from chair height. Baseline: 19.81s Goal status: INITIAL  5.  Patient will score 53 on Berg Balance test to demonstrate lower risk of falls. (MCID= 8 points) Baseline: 45 Goal status: INITIAL  ASSESSMENT:  CLINICAL IMPRESSION: Patient is a 78 y.o. female who was seen today for physical therapy evaluation and treatment for CVA on 10/24/23. Currently she is ambulating with a rollator and doing some walking without an AD safely around her apartment. She walks with decreased hip flexion and foot clearance on her RLE during swing phase of gait. With functional tests done today she presents as moderate fall risk. Patient will benefit from skilled PT to address her balance, gait, and strength deficits to return to PLOF, be able to drive, and go back to work at [employer].    OBJECTIVE IMPAIRMENTS: Abnormal gait, decreased balance, decreased coordination, decreased endurance, difficulty walking, and decreased strength.   ACTIVITY LIMITATIONS: stairs, transfers, and locomotion level  PARTICIPATION LIMITATIONS:  driving, shopping, community activity, and occupation  PERSONAL FACTORS: Transportation are also affecting patient's functional outcome.   REHAB POTENTIAL: Good  CLINICAL DECISION MAKING: Stable/uncomplicated  EVALUATION COMPLEXITY: Low  PLAN:  PT FREQUENCY: 2x/week  PT DURATION: 12 weeks  PLANNED INTERVENTIONS: 97110-Therapeutic exercises, 97530- Therapeutic activity, 97112- Neuromuscular re-education, 97535- Self Care, 16109- Manual therapy, [phone]- Gait training, Patient/Family education, Balance training, and Stair training  PLAN FOR NEXT SESSION: start gym activities working on LE strength, balance, and functional movements. Gait training, focusing on increasing R hip flexion   Cassie Freer, PT 12/02/2023, 12:37 PM

## 2023-12-05 ENCOUNTER — Ambulatory Visit: Payer: Medicare Other

## 2023-12-10 ENCOUNTER — Ambulatory Visit: Payer: Medicare Other

## 2023-12-15 ENCOUNTER — Telehealth: Payer: Self-pay | Admitting: Adult Health

## 2023-12-15 NOTE — Telephone Encounter (Signed)
Patient called to verify appointment 

## 2023-12-16 ENCOUNTER — Encounter: Payer: Self-pay | Admitting: Physical Therapy

## 2023-12-16 ENCOUNTER — Ambulatory Visit: Payer: Medicare Other | Admitting: Physical Therapy

## 2023-12-16 DIAGNOSIS — I6381 Other cerebral infarction due to occlusion or stenosis of small artery: Secondary | ICD-10-CM | POA: Diagnosis not present

## 2023-12-16 DIAGNOSIS — R2681 Unsteadiness on feet: Secondary | ICD-10-CM

## 2023-12-16 DIAGNOSIS — M6281 Muscle weakness (generalized): Secondary | ICD-10-CM

## 2023-12-16 DIAGNOSIS — R2689 Other abnormalities of gait and mobility: Secondary | ICD-10-CM

## 2023-12-16 DIAGNOSIS — R278 Other lack of coordination: Secondary | ICD-10-CM

## 2023-12-16 NOTE — Therapy (Signed)
 OUTPATIENT PHYSICAL THERAPY NEURO TREATMENT   Patient Name: Jordan Collier MRN: [id number] DOB:30-Apr-1946, 78 y.o., female Today's Date: 12/16/2023   PCP: Blenda Mounts REFERRING PROVIDER: Jacalyn Lefevre  END OF SESSION:  PT End of Session - 12/16/23 1101     Visit Number 2    Date for PT Re-Evaluation 02/24/24    Authorization Type BCBS    PT Start Time 1101    PT Stop Time 1141    PT Time Calculation (min) 40 min    Activity Tolerance Patient tolerated treatment well    Behavior During Therapy WFL for tasks assessed/performed              Past Medical History:  Diagnosis Date   Allergy to mold    Hypercholesterolemia    Psoriasis    Trigger finger, right middle finger    Past Surgical History:  Procedure Laterality Date   TRIGGER FINGER RELEASE Right 01/2023   TUBAL LIGATION N/A 1980   Patient Active Problem List   Diagnosis Date Noted   History of shingles 11/11/2023   Vitamin D insufficiency 11/04/2023   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   Hyperlipemia 10/27/2023   Stroke determined by clinical assessment (HCC) 10/24/2023    ONSET DATE: 10/24/23  REFERRING DIAG:  I63.81 (ICD-10-CM) - Thalamic stroke (HCC)    THERAPY DIAG:  Other abnormalities of gait and mobility  Muscle weakness (generalized)  Other lack of coordination  Unsteadiness on feet  Rationale for Evaluation and Treatment: Rehabilitation  SUBJECTIVE:                                                                                                                                                                                             SUBJECTIVE STATEMENT:  I ditched my rollator, didn't want to get rollator arms or getting really hunched over. But I do use it still when I'm really pooped at night.        EVAL: I am okay, a little pooped. My dexterity is off, I don't feel like I have the balance like I used to when I left the hospital.   Pt accompanied by: self  PERTINENT  HISTORY: [name] is a 78 y.o. female with history of hypercholesterolemia, herpes simplex, cataracts presenting 1/3 with acute onset of right-sided weakness. Patient called EMS after she had trouble getting out of her car due to her right leg weakness. CODE STROKE was activated by EMS.  Denies any hitroy of strokes, headaches, does not take blood thinners. CTH negative. Patient was unable to walk on assessment, even leaning to right while trying to simply sit up.  Management with thrombolytic therapy was explained to the patient, or patient's representative, as were risks, benefits and alternatives. All questions were answered. Patient, or patient's representative, expressed understanding of the treatment plan and agreed to proceed with thrombolytic treatment.  PAIN:  Are you having pain? No 0/10  PRECAUTIONS: Fall  RED FLAGS: None   WEIGHT BEARING RESTRICTIONS: No  FALLS: Has patient fallen in last 6 months? Yes. Number of falls 1 fell in the [facility] driveway Sept 5th  LIVING ENVIRONMENT: Lives with: lives alone Lives in: House/apartment Stairs:  has both an Engineer, structural or can take the stairs, normally she used to take the stairs  Has following equipment at home: Dan Humphreys - 4 wheeled  PLOF: Independent and Independent with basic ADLs  PATIENT GOALS: to be able to drive, go back to work, and go up and down steps   OBJECTIVE:  Note: Objective measures were completed at Evaluation unless otherwise noted.  DIAGNOSTIC FINDINGS:  FINDINGS: Brain: There is a 1.5 cm acute infarct involving the lateral aspect of the left thalamus and adjacent posterior limb of the internal capsule. A single chronic microhemorrhage is noted in the right frontal white matter. T2 hyperintensities in the cerebral white matter bilaterally are nonspecific but compatible with mild-to-moderate chronic small vessel ischemic disease. There is mild generalized cerebral atrophy.   Vascular: Major  intracranial vascular flow voids are preserved.   Skull and upper cervical spine: Unremarkable bone marrow signal.   Sinuses/Orbits: Bilateral cataract extraction. Paranasal sinuses and mastoid air cells are clear.   Other: None.   IMPRESSION: 1. Acute left thalamocapsular infarct. 2. 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 disease  COGNITION: Overall cognitive status: Within functional limits for tasks assessed   SENSATION: WFL  COORDINATION: Decreased coordination   POSTURE: rounded shoulders and forward head  LOWER EXTREMITY ROM:  all WFL    LOWER EXTREMITY MMT:  R hip flexion 3+/5 R knee extension 4-/5    TRANSFERS: Assistive device utilized: Environmental consultant - 4 wheeled  Sit to stand: CGA Stand to sit: SBA  STAIRS: Level of Assistance: CGA Stair Negotiation Technique: Step to Pattern with Single Rail on Right  GAIT: Gait pattern: step to pattern, decreased stance time- Right, decreased stride length, decreased hip/knee flexion- Right, ataxic, wide BOS, and poor foot clearance- Right Distance walked: in clinic distances Assistive device utilized: Environmental consultant - 4 wheeled Level of assistance: SBA   FUNCTIONAL TESTS:  5 times sit to stand: 19.81s Timed up and go (TUG): 16.25s Berg Balance Scale: 45/56                                                                                                                             TREATMENT DATE:   12/16/23  Henreitta Leber + ABD into green TB 2x12  Sidelying clams green TB 2x12 B STS green TB x12 cues to keep tension on band and control stand to sit adequately   Nustep L6x8 minutes all four extremities for  promotion of reciprocal motion, conditioning    Tandem stance blue foam pad 3x30 seconds B Narrow BOS EC 3x30 seconds       12/02/23- EVAL    PATIENT EDUCATION: Education details: POC, HEP, education about fall risk Person educated: Patient Education method: Explanation Education comprehension: verbalized  understanding  HOME EXERCISE PROGRAM: Access Code: [email or web address] URL: [email or web address] Date: 12/02/2023 Prepared by: Cassie Freer  Exercises - Standing Tandem Balance with Counter Support  - 1 x daily - 7 x weekly - 2 sets - 10 reps - 15 hold - Standing Single Leg Stance with Counter Support  - 1 x daily - 7 x weekly - 2 sets - 10 reps - 10 hold - Standing Hip Abduction with Counter Support  - 1 x daily - 7 x weekly - 2 sets - 10 reps - Seated March with Resistance  - 1 x daily - 7 x weekly - 2 sets - 10 reps - Seated Hip Abduction with Resistance  - 1 x daily - 7 x weekly - 2 sets - 10 reps  GOALS: Goals reviewed with patient? Yes  SHORT TERM GOALS: Target date: 01/13/24  Patient will be independent with initial HEP. Baseline: given 12/02/23 Goal status: INITIAL  2.  Patient will demonstrate decreased fall risk by scoring < 14 sec on TUG. Baseline: 16.25s without walker Goal status: INITIAL    LONG TERM GOALS: Target date: 02/24/24  Patient will be independent with advanced/ongoing HEP to improve outcomes and carryover.  Baseline:  Goal status: INITIAL  2.  Patient will be able to ambulate 500' with normalized gait pattern and good safety to access community.  Baseline: using rollator and some short distances without, decreased hip flexion on R LE Goal status: INITIAL  3.  Patient will be able to step up/down a flight of steps to safely to access apartment  Baseline: was using stairs in apt prior to stroke Goal status: INITIAL   4.  Patient will demonstrate improved functional LE strength as demonstrated by 5xSTS <14s from chair height. Baseline: 19.81s Goal status: INITIAL  5.  Patient will score 53 on Berg Balance test to demonstrate lower risk of falls. (MCID= 8 points) Baseline: 45 Goal status: INITIAL  ASSESSMENT:  CLINICAL IMPRESSION:   Pt arrives today doing well, she has been progressing herself away from the rollator and working hard on her  own, really motivated to get back to her job at [employer]. Worked on a mix of functional strengthening, balance, and coordination today. Really did well, will continue to challenge her as able.      EVAL: Patient is a 78 y.o. female who was seen today for physical therapy evaluation and treatment for CVA on 10/24/23. Currently she is ambulating with a rollator and doing some walking without an AD safely around her apartment. She walks with decreased hip flexion and foot clearance on her RLE during swing phase of gait. With functional tests done today she presents as moderate fall risk. Patient will benefit from skilled PT to address her balance, gait, and strength deficits to return to PLOF, be able to drive, and go back to work at [employer].    OBJECTIVE IMPAIRMENTS: Abnormal gait, decreased balance, decreased coordination, decreased endurance, difficulty walking, and decreased strength.   ACTIVITY LIMITATIONS: stairs, transfers, and locomotion level  PARTICIPATION LIMITATIONS: driving, shopping, community activity, and occupation  PERSONAL FACTORS: Transportation are also affecting patient's functional outcome.   REHAB POTENTIAL: Good  CLINICAL DECISION MAKING: Stable/uncomplicated  EVALUATION COMPLEXITY: Low  PLAN:  PT FREQUENCY: 2x/week  PT DURATION: 12 weeks  PLANNED INTERVENTIONS: 97110-Therapeutic exercises, 97530- Therapeutic activity, 97112- Neuromuscular re-education, 97535- Self Care, 16109- Manual therapy, [phone]- Gait training, Patient/Family education, Balance training, and Stair training  PLAN FOR NEXT SESSION: start gym activities working on LE strength, balance, and functional movements. Gait training, focusing on increasing R hip flexion. Work on general conditioning   Nedra Hai, PT, DPT 12/16/23 11:41 AM

## 2023-12-17 NOTE — Progress Notes (Unsigned)
 [facility] [address] [place]. [place] [address] [phone]       HOSPITAL FOLLOW UP NOTE  [name] Date of Birth:  [birth year] Medical Record Number:  [id number]   Reason for Referral:  hospital stroke follow up    SUBJECTIVE:   CHIEF COMPLAINT:  No chief complaint on file.   HPI:   [name] is a 78 y.o. female with history of hypercholesterolemia, herpes simplex, cataracts who presented to ED on 10/24/2023 with acute onset of right-sided weakness.  Stroke workup revealed left PLIC infarct s/p TNK secondary to small vessel disease.  Recommended DAPT for 3 weeks and aspirin alone.  LDL 196, started on Zetia due to history of statin intolerance and pursuing Leqvio.  Incidental finding of thyroid nodule on CTA head/neck, thyroid ultrasound met criteria for 1 year surveillance.  Residual right sided weakness with decreased RLE sensation and discharged to CIR on 1/7 for ongoing therapy needs.  She was [facility] to [facility] on 1/22 for follow-up therapy and to assist with transitioning to home setting.        PERTINENT IMAGING  Code Stroke CT head: No acute abnormality.  CTA head & neck: No LVO MRI: Acute left thalamocapsular infarct. 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 vessel disease 2D Echo: LVEF 65 to 70%  LDL 196 HgbA1c 5.8    ROS:   14 system review of systems performed and negative with exception of ***  PMH:  Past Medical History:  Diagnosis Date   Allergy to mold    Hypercholesterolemia    Psoriasis    Trigger finger, right middle finger     PSH:  Past Surgical History:  Procedure Laterality Date   TRIGGER FINGER RELEASE Right 01/2023   TUBAL LIGATION N/A 1980    Social History:  Social History   Socioeconomic History   Marital status: Divorced    Spouse name: Not on file   Number of children: 2   Years of education: Not on file   Highest education level: Not on file  Occupational History    Occupation: Musician: [employer]  Tobacco Use   Smoking status: Former    Current packs/day: 0.00    Types: Cigarettes    Quit date: 10/1999    Years since quitting: 24.1   Smokeless tobacco: Never  Vaping Use   Vaping status: Never Used  Substance and Sexual Activity   Alcohol use: Not Currently   Drug use: Not Currently   Sexual activity: Not Currently  Other Topics Concern   Not on file  Social History Narrative   Not on file   Social Drivers of Health   Financial Resource Strain: Not on file  Food Insecurity: Patient Declined (10/24/2023)   Hunger Vital Sign    Worried About Running Out of Food in the Last Year: Patient declined    Ran Out of Food in the Last Year: Patient declined  Transportation Needs: No Transportation Needs (10/24/2023)   PRAPARE - Administrator, Civil Service (Medical): No    Lack of Transportation (Non-Medical): No  Physical Activity: Not on file  Stress: Not on file  Social Connections: Unknown (10/24/2023)   Social Connection and Isolation Panel [NHANES]    Frequency of Communication with Friends and Family: More than three times a week    Frequency of Social Gatherings with Friends and Family: More than three times a week    Attends Religious Services: Patient declined  Active Member of Clubs or Organizations: No    Attends Banker Meetings: Never    Marital Status: Divorced  Catering manager Violence: Not At Risk (10/24/2023)   Humiliation, Afraid, Rape, and Kick questionnaire    Fear of Current or Ex-Partner: No    Emotionally Abused: No    Physically Abused: No    Sexually Abused: No    Family History:  Family History  Problem Relation Age of Onset   Hyperlipidemia Mother    Diabetes Mother    Cancer Father    Seizures Son     Medications:   Current Outpatient Medications on File Prior to Visit  Medication Sig Dispense Refill   aspirin EC 81 MG tablet Take 1 tablet (81 mg total) by mouth daily.  Swallow whole.     cetirizine (KLS ALLER-TEC) 10 MG tablet Take 10 mg by mouth daily as needed for allergies (sinus congestion).     Cholecalciferol (VITAMIN D-3 PO) Take 1 tablet by mouth daily.     Cyanocobalamin (VITAMIN B-12 PO) Take 0.5 tablets by mouth daily.     ezetimibe (ZETIA) 10 MG tablet Take 1 tablet (10 mg total) by mouth daily. 30 tablet 0   ezetimibe (ZETIA) 10 MG tablet Take 1 tablet (10 mg total) by mouth daily. 90 tablet 1   mineral oil-hydrophilic petrolatum (AQUAPHOR) ointment Apply 1 Application topically as needed (lip irritation).     Mouthwashes (MOUTH RINSE) LIQD solution 15 mLs by Mouth Rinse route as needed (for oral care).     Multiple Vitamins-Minerals (MULTIVITAMIN WOMEN 50+) TABS Take 1 tablet by mouth daily.     pravastatin (PRAVACHOL) 10 MG tablet Take by mouth.     pravastatin (PRAVACHOL) 10 MG tablet Take 1 tablet (10 mg total) by mouth at bedtime. 90 tablet 1   senna-docusate (SENOKOT-S) 8.6-50 MG tablet Take 1 tablet by mouth at bedtime as needed for mild constipation or moderate constipation.     sodium chloride (MURO 128) 2 % ophthalmic solution 1 drop.     valACYclovir (VALTREX) 1000 MG tablet Take 2,000 mg by mouth See admin instructions. Take 2 tablets (2000mg ) twice daily for 1 day with onset of cold sores.     No current facility-administered medications on file prior to visit.    Allergies:   Allergies  Allergen Reactions   Decadron [Dexamethasone] Rash   Molds & Smuts Other (See Comments)    Mucus over production   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 Other (See Comments)    Mucus over production   Penicillins Other (See Comments)    Unknown reaction Childhood allergy   Sulfa Antibiotics Other (See Comments)    Unknown reaction Childhood allergy   Lanolin Swelling    Lip swelling when using lip balm containing lanolin.   Statins Other (See Comments)    Myalgias Vision changes Severe constipation      OBJECTIVE:  Physical  Exam  There were no vitals filed for this visit. There is no height or weight on file to calculate BMI. No results found.   General: well developed, well nourished, seated, in no evident distress Head: head normocephalic and atraumatic.   Neck: supple with no carotid or supraclavicular bruits Cardiovascular: regular rate and rhythm, no murmurs Musculoskeletal: no deformity Skin:  no rash/petichiae Vascular:  Normal pulses all extremities   Neurologic Exam Mental Status: Awake and fully alert. Oriented to place and time. Recent and remote memory intact. Attention span, concentration and fund of knowledge appropriate.  Mood and affect appropriate.  Cranial Nerves: Fundoscopic exam reveals sharp disc margins. Pupils equal, briskly reactive to light. Extraocular movements full without nystagmus. Visual fields full to confrontation. Hearing intact. Facial sensation intact. Face, tongue, palate moves normally and symmetrically.  Motor: Normal bulk and tone. Normal strength in all tested extremity muscles Sensory.: intact to touch , pinprick , position and vibratory sensation.  Coordination: Rapid alternating movements normal in all extremities. Finger-to-nose and heel-to-shin performed accurately bilaterally. Gait and Station: Arises from chair without difficulty. Stance is normal. Gait demonstrates normal stride length and balance with ***. Tandem walk and heel toe ***.  Reflexes: 1+ and symmetric. Toes downgoing.     NIHSS  *** Modified Rankin  ***      ASSESSMENT: Jordan Collier is a 78 y.o. year old female with left PLIC stroke in 10/2023 s/p TNK secondary to small vessel disease. Vascular risk factors include HLD and advanced age.      PLAN:  L PLIC stroke :  Residual deficit: ***.  Continue aspirin 81mg  daily and {statin:28096} for secondary stroke prevention managed/prescribed by PCP.   Discussed secondary stroke prevention measures and importance of close PCP follow up  for aggressive stroke risk factor management including BP goal<130/90, HLD with LDL goal<70 and DM with A1c.<7 .  Stroke labs 10/2023: LDL 196, A1c 5.8 I have gone over the pathophysiology of stroke, warning signs and symptoms, risk factors and their management in some detail with instructions to go to the closest emergency room for symptoms of concern.     Follow up in *** or call earlier if needed   CC:  [facility] provider: Dr. Pearlean Brownie PCP: Karna Dupes, MD    I spent *** minutes of face-to-face and non-face-to-face time with patient.  This included previsit chart review including review of recent hospitalization, lab review, study review, order entry, electronic health record documentation, patient education regarding recent stroke including etiology, secondary stroke prevention measures and importance of managing stroke risk factors, residual deficits and typical recovery time and answered all other questions to patient satisfaction   Ihor Austin, AGNP-BC  [facility] [address] [place], [place] [address]  Phone [phone] Fax [phone] Note: This document was prepared with digital dictation and possible smart phrase technology. Any transcriptional errors that result from this process are unintentional.

## 2023-12-18 ENCOUNTER — Ambulatory Visit: Payer: Medicare Other | Admitting: Adult Health

## 2023-12-18 ENCOUNTER — Encounter: Payer: Self-pay | Admitting: Adult Health

## 2023-12-18 VITALS — BP 121/63 | HR 69 | Ht 68.0 in | Wt 153.0 lb

## 2023-12-18 DIAGNOSIS — G8191 Hemiplegia, unspecified affecting right dominant side: Secondary | ICD-10-CM | POA: Diagnosis not present

## 2023-12-18 DIAGNOSIS — I6381 Other cerebral infarction due to occlusion or stenosis of small artery: Secondary | ICD-10-CM | POA: Diagnosis not present

## 2023-12-18 NOTE — Therapy (Signed)
 OUTPATIENT OCCUPATIONAL THERAPY NEURO EVALUATION  Patient Name: Jordan Collier MRN: [id number] DOB:[birth year], 78 y.o., female Today's Date: 12/19/2023  PCP: Dr. Harrison Mons REFERRING PROVIDER: Jacalyn Lefevre, NP/ Dr. Carlis Abbott  END OF SESSION:  OT End of Session - 12/19/23 0930     Visit Number 2    Number of Visits 25    Date for OT Re-Evaluation 02/24/24    Authorization Type [employer] MCR    Authorization - Visit Number 2    Progress Note Due on Visit 10    OT Start Time 0931    OT Stop Time 1010    OT Time Calculation (min) 39 min    Activity Tolerance Patient tolerated treatment well    Behavior During Therapy WFL for tasks assessed/performed              Past Medical History:  Diagnosis Date   Allergy to mold    Hypercholesterolemia    Psoriasis    Trigger finger, right middle finger    Past Surgical History:  Procedure Laterality Date   TRIGGER FINGER RELEASE Right 01/2023   TUBAL LIGATION N/A 1980   Patient Active Problem List   Diagnosis Date Noted   History of shingles 11/11/2023   Vitamin D insufficiency 11/04/2023   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   Hyperlipemia 10/27/2023   Stroke determined by clinical assessment (HCC) 10/24/2023    ONSET DATE: 10/24/23  REFERRING DIAG:  Diagnosis  I63.81 (ICD-10-CM) - Thalamic stroke (HCC)    THERAPY DIAG:  Other lack of coordination  Muscle weakness (generalized)  Frontal lobe and executive function deficit  Unsteadiness on feet  Rationale for Evaluation and Treatment: Rehabilitation  SUBJECTIVE:   SUBJECTIVE STATEMENT: Saw neurologist yesterday, she said I needed to gain more strength in my leg before driving  Pt accompanied by: self  PERTINENT HISTORY: Pt is a 78 y/o female presenting 1/3 with sudden onset of R sided weakness.  TNK administered.  MRI (+) Acute left thalamocapsular infarct. PMH; hyperlipidemia       PRECAUTIONS: Fall  WEIGHT BEARING RESTRICTIONS: No  PAIN:  no   FALLS: Has  patient fallen in last 6 months? No  LIVING ENVIRONMENT: Lives with: lives alone Lives in: House/apartment Stairs: No, has elevator Has following equipment at home:  rollator  PLOF: Independent  PATIENT GOALS: improve function in RUE  OBJECTIVE:  Note: Objective measures were completed at Evaluation unless otherwise noted.  HAND DOMINANCE: LUE  ADLs: Overall ADLs: modified Independent Transfers/ambulation related to ADLs:mod I with rollator  Tub Shower transfers: walk in shower with seat Equipment:  rollator  IADLs:  Meal Prep: has cooked 1 meal however she is able to go dining room/ restaurant at [employer]   MOBILITY STATUS:  mod I with rollator    ACTIVITY TOLERANCE: Activity tolerance: fatigues more quickly   FUNCTIONAL OUTCOME MEASURES: Quick Dash: 36% disability  UPPER EXTREMITY ROM:  WFLS for LUE, grossly WFLs for RUE with exception of internal rotation/ difficulty reaching behind back   Active ROM Right eval Left eval  Shoulder flexion    Shoulder abduction    Shoulder adduction    Shoulder extension    Shoulder internal rotation  grossly 75%  Shoulder external rotation    Elbow flexion    Elbow extension    Wrist flexion    Wrist extension    Wrist ulnar deviation    Wrist radial deviation    Wrist pronation    Wrist supination    (Blank rows =  not tested)  UPPER EXTREMITY MMT:     MMT Right eval Left eval  Shoulder flexion 3+/5 4+/5  Shoulder abduction    Shoulder adduction    Shoulder extension    Shoulder internal rotation    Shoulder external rotation    Middle trapezius    Lower trapezius    Elbow flexion 4/5 4+/5  Elbow extension 4/5 4+/5  Wrist flexion    Wrist extension    Wrist ulnar deviation    Wrist radial deviation    Wrist pronation    Wrist supination    (Blank rows = not tested)  HAND FUNCTION: Grip strength: Right: 30 lbs; Left: 45 lbs  COORDINATION: 9 Hole Peg test: Right: 35.17 sec; Left: 28.32 sec,  Box/ blocks: RUE: 45 blocks, LUE 55 blocks  SENSATION: WFL    COGNITION: Overall cognitive status:  delayed processing per pt report. Pt spells"WORLD" backwards correctly, pt was able to correctly perfrom clock drawing task   VISION ASSESSMENT: Not tested- denies visual changes from CVA      OBSERVATIONS: 78 y.o female is highly motivated to improve so she can return to work                                                                                                                             TREATMENT DATE:    12/19/23:  Pt instructed in coordination HEP and yellow theraband HEP--see pt education section.  12/02/23- eval completed , see education      PATIENT EDUCATION: Education details: Coordination HEP,  Yellow theraband HEP.  Reviewed prior putty HEP (verbally)--grip, pinch, and intrinsic + pull with yellow-red putty  Person educated: Patient Education method: Explanation, Demonstration, Verbal cues, and Handouts Education comprehension: verbalized understanding  HOME EXERCISE PROGRAM: Previously was issued putty HEP 12/18/22:  Coordination HEP, Yellow theraband HEP   GOALS: Goals reviewed with patient? Yes  SHORT TERM GOALS: Target date: 12/29/23  I with inital HEP  Goal status: INITIAL  2.  Pt will demonstrate improved RUE functional use for ADLs as evidenced by increasing box/ blocks score by 4 blocks.  Goal status: INITIAL  3.   Pt. will demonstrate ability to perfrom a physical and cognitive task simultaneously  with 90% or better accuracy in prep for return to work and driving.  Goal status: INITIAL  4.  Pt will increase RUE grip strength by 5 lbs for increased RUE functional use.  Goal status: INITIAL  5.  Pt will report increased ease with separating paper/ pages with RUE.  Goal status: INITIAL  6.  Pt will bathe her back modified indpendently with RUE.  Goal status: INITIAL  LONG TERM GOALS: Target date: 02/24/24  I with updated  HEP.  Goal status: INITIAL  2.  Pt will demonstrate improved fine motor coordination for ADLs as evidenced by decreasing RUE 9 hole peg test score by 3 secs.  Goal status: INITIAL  3.  Pt will perform simulated  work activities modified independently.  Goal status: INITIAL  4.  Pt will report increased ease and accuracy with typing.  Goal status: INITIAL  5.  Pt will demonstrate ability to retrieve 3 lbs from overhead shelf with good control. Baseline:  Goal status: INITIAL   ASSESSMENT:  CLINICAL IMPRESSION: Pt is progressing towards goals.  She is motivated and responds well to cueing.  Pt verbalized understanding of HEP.   PERFORMANCE DEFICITS: in functional skills including ADLs, IADLs, coordination, dexterity, ROM, strength, pain, flexibility, Fine motor control, Gross motor control, mobility, balance, endurance, decreased knowledge of precautions, decreased knowledge of use of DME, and UE functional use, cognitive skills including attention, safety awareness, and thought, and psychosocial skills including coping strategies, environmental adaptation, habits, interpersonal interactions, and routines and behaviors.   IMPAIRMENTS: are limiting patient from ADLs, IADLs, work, play, leisure, and social participation.   CO-MORBIDITIES: may have co-morbidities  that affects occupational performance. Patient will benefit from skilled OT to address above impairments and improve overall function.  MODIFICATION OR ASSISTANCE TO COMPLETE EVALUATION: No modification of tasks or assist necessary to complete an evaluation.  OT OCCUPATIONAL PROFILE AND HISTORY: Detailed assessment: Review of records and additional review of physical, cognitive, psychosocial history related to current functional performance.  CLINICAL DECISION MAKING: LOW - limited treatment options, no task modification necessary  REHAB POTENTIAL: Good  EVALUATION COMPLEXITY: Low    PLAN:  OT FREQUENCY:  2x/week  OT DURATION: 12 weeks, will likely d/c after 6-8 weeks dependent on progress.  PLANNED INTERVENTIONS: 97168 OT Re-evaluation, 97535 self care/ADL training, 14782 therapeutic exercise, 97530 therapeutic activity, 97112 neuromuscular re-education, 97140 manual therapy, 97116 gait training, 95621 aquatic therapy, 97035 ultrasound, 97018 paraffin, 30865 moist heat, 97010 cryotherapy, 97129 Cognitive training (first 15 min), 78469 Cognitive training(each additional 15 min), passive range of motion, balance training, stair training, functional mobility training, psychosocial skills training, energy conservation, coping strategies training, patient/family education, and DME and/or AE instructions  RECOMMENDED OTHER SERVICES: PT  CONSULTED AND AGREED WITH PLAN OF CARE: Patient  PLAN FOR NEXT SESSION: review HEP, continue with coordination, divided attention.   pt is highly motivated to improve and to return to work at [employer], OTR/L 12/19/2023, 12:24 PM

## 2023-12-18 NOTE — Patient Instructions (Addendum)
 Continue working with physical therapy for hopeful ongoing recovery  Continue aspirin 81 mg daily  and Zetia and pravastatin  for secondary stroke prevention [facility] about [facility] for Leqvio - you should be contacted by them regarding this  Continue to follow up with PCP regarding blood pressure and cholesterol management  Maintain strict control of hypertension with blood pressure goal below 130/90 and cholesterol with LDL cholesterol (bad cholesterol) goal below 70 mg/dL.   Signs of a Stroke? Follow the BEFAST method:  Balance Watch for a sudden loss of balance, trouble with coordination or vertigo Eyes Is there a sudden loss of vision in one or both eyes? Or double vision?  Face: Ask the person to smile. Does one side of the face droop or is it numb?  Arms: Ask the person to raise both arms. Does one arm drift downward? Is there weakness or numbness of a leg? Speech: Ask the person to repeat a simple phrase. Does the speech sound slurred/strange? Is the person confused ? Time: If you observe any of these signs, call 911.       Thank you for coming to see [place] at [facility]. I hope we have been able to provide you high quality care today.  You may receive a patient satisfaction survey over the next few weeks. We would appreciate your feedback and comments so that we may continue to improve ourselves and the health of our patients.   Stroke Prevention Some medical conditions and lifestyle choices can lead to a higher risk for a stroke. You can help to prevent a stroke by eating healthy foods and exercising. It also helps to not smoke and to manage any health problems you may have. How can this condition affect me? A stroke is an emergency. It should be treated right away. A stroke can lead to brain damage or threaten your life. There is a better chance of surviving and getting better after a stroke if you get medical help right away. What  can increase my risk? The following medical conditions may increase your risk of a stroke: Diseases of the heart and blood vessels (cardiovascular disease). High blood pressure (hypertension). Diabetes. High cholesterol. Sickle cell disease. Problems with blood clotting. Being very overweight. Sleeping problems (obstructivesleep apnea). Other risk factors include: Being older than age 80. A history of blood clots, stroke, or mini-stroke (TIA). Race, ethnic background, or a family history of stroke. Smoking or using tobacco products. Taking birth control pills, especially if you smoke. Heavy alcohol and drug use. Not being active. What actions can I take to prevent this? Manage your health conditions High cholesterol. Eat a healthy diet. If this is not enough to manage your cholesterol, you may need to take medicines. Take medicines as told by your doctor. High blood pressure. Try to keep your blood pressure below 130/80. If your blood pressure cannot be managed through a healthy diet and regular exercise, you may need to take medicines. Take medicines as told by your doctor. Ask your doctor if you should check your blood pressure at home. Have your blood pressure checked every year. Diabetes. Eat a healthy diet and get regular exercise. If your blood sugar (glucose) cannot be managed through diet and exercise, you may need to take medicines. Take medicines as told by your doctor. Talk to your doctor about getting checked for sleeping problems. Signs of a problem can include: Snoring a lot. Feeling very tired. Make sure that you  manage any other conditions you have. Nutrition  Follow instructions from your doctor about what to eat or drink. You may be told to: Eat and drink fewer calories each day. Limit how much salt (sodium) you use to 1,500 milligrams (mg) each day. Use only healthy fats for cooking, such as olive oil, canola oil, and sunflower oil. Eat healthy foods. To  do this: Choose foods that are high in fiber. These include whole grains, and fresh fruits and vegetables. Eat at least 5 servings of fruits and vegetables a day. Try to fill one-half of your plate with fruits and vegetables at each meal. Choose low-fat (lean) proteins. These include low-fat cuts of meat, chicken without skin, fish, tofu, beans, and nuts. Eat low-fat dairy products. Avoid foods that: Are high in salt. Have saturated fat. Have trans fat. Have cholesterol. Are processed or pre-made. Count how many carbohydrates you eat and drink each day. Lifestyle If you drink alcohol: Limit how much you have to: 0-1 drink a day for women who are not pregnant. 0-2 drinks a day for men. Know how much alcohol is in your drink. In the [place] one drink equals one 12 oz bottle of beer ( ), one 5 oz glass of wine ( ), or one 1 oz glass of hard liquor (44mL). Do not smoke or use any products that have nicotine or tobacco. If you need help quitting, ask your doctor. Avoid secondhand smoke. Do not use drugs. Activity  Try to stay at a healthy weight. Get at least 30 minutes of exercise on most days, such as: Fast walking. Biking. Swimming. Medicines Take over-the-counter and prescription medicines only as told by your doctor. Avoid taking birth control pills. Talk to your doctor about the risks of taking birth control pills if: You are over 45 years old. You smoke. You get very bad headaches. You have had a blood clot. Where to find more information American Stroke Association: www.strokeassociation.org Get help right away if: You or a loved one has any signs of a stroke. "BE FAST" is an easy way to remember the warning signs: B - Balance. Dizziness, sudden trouble walking, or loss of balance. E - Eyes. Trouble seeing or a change in how you see. F - Face. Sudden weakness or loss of feeling of the face. The face or eyelid may droop on one side. A - Arms. Weakness or loss of  feeling in an arm. This happens all of a sudden and most often on one side of the body. S - Speech. Sudden trouble speaking, slurred speech, or trouble understanding what people say. T - Time. Time to call emergency services. Write down what time symptoms started. You or a loved one has other signs of a stroke, such as: A sudden, very bad headache with no known cause. Feeling like you may vomit (nausea). Vomiting. A seizure. These symptoms may be an emergency. Get help right away. Call your local emergency services (911 in the [place] Do not wait to see if the symptoms will go away. Do not drive yourself to the hospital. Summary You can help to prevent a stroke by eating healthy, exercising, and not smoking. It also helps to manage any health problems you have. Do not smoke or use any products that contain nicotine or tobacco. Get help right away if you or a loved one has any signs of a stroke. This information is not intended to replace advice given to you by your health care provider. Make sure you discuss any  questions you have with your health care provider. Document Revised: 09/09/2022 Document Reviewed: 09/09/2022 Elsevier Patient Education  2024 Else[employer] Inc.    Cholesterol Content in Foods Cholesterol is a waxy, fat-like substance that helps to carry fat in the blood. The body needs cholesterol in small amounts, but too much cholesterol can cause damage to the arteries and heart. What foods have cholesterol?  Cholesterol is found in animal-based foods, such as meat, seafood, and dairy. Generally, low-fat dairy and lean meats have less cholesterol than full-fat dairy and fatty meats. The milligrams of cholesterol per serving (mg per serving) of common cholesterol-containing foods are listed below. Meats and other proteins Egg -- one large whole egg has 186 mg. Veal shank -- 4 oz (113 g) has 141 mg. Lean ground [place] (93% lean) -- 4 oz (113 g) has 118 mg. Fat-trimmed lamb loin --  4 oz (113 g) has 106 mg. Lean ground beef (90% lean) -- 4 oz (113 g) has 100 mg. Lobster -- 3.5 oz (99 g) has 90 mg. Pork loin chops -- 4 oz (113 g) has 86 mg. Canned salmon -- 3.5 oz (99 g) has 83 mg. Fat-trimmed beef top loin -- 4 oz (113 g) has 78 mg. Frankfurter -- 1 frank (3.5 oz or 99 g) has 77 mg. Crab -- 3.5 oz (99 g) has 71 mg. Roasted chicken without skin, white meat -- 4 oz (113 g) has 66 mg. Light bologna -- 2 oz (57 g) has 45 mg. Deli-cut [place] -- 2 oz (57 g) has 31 mg. Canned tuna -- 3.5 oz (99 g) has 31 mg. Tomasa Blase -- 1 oz (28 g) has 29 mg. Oysters and mussels (raw) -- 3.5 oz (99 g) has 25 mg. Mackerel -- 1 oz (28 g) has 22 mg. Trout -- 1 oz (28 g) has 20 mg. Pork sausage -- 1 link (1 oz or 28 g) has 17 mg. Salmon -- 1 oz (28 g) has 16 mg. Tilapia -- 1 oz (28 g) has 14 mg. Dairy Soft-serve ice cream --  cup (4 oz or 86 g) has 103 mg. Whole-milk yogurt -- 1 cup (8 oz or 245 g) has 29 mg. Cheddar cheese -- 1 oz (28 g) has 28 mg. American cheese -- 1 oz (28 g) has 28 mg. Whole milk -- 1 cup (8 oz or 250 mL) has 23 mg. 2% milk -- 1 cup (8 oz or 250 mL) has 18 mg. Cream cheese -- 1 tablespoon (Tbsp) (14.5 g) has 15 mg. Cottage cheese --  cup (4 oz or 113 g) has 14 mg. Low-fat (1%) milk -- 1 cup (8 oz or 250 mL) has 10 mg. Sour cream -- 1 Tbsp (12 g) has 8.5 mg. Low-fat yogurt -- 1 cup (8 oz or 245 g) has 8 mg. Nonfat Greek yogurt -- 1 cup (8 oz or 228 g) has 7 mg. Half-and-half cream -- 1 Tbsp (15 mL) has 5 mg. Fats and oils Cod liver oil -- 1 tablespoon (Tbsp) (13.6 g) has 82 mg. Butter -- 1 Tbsp (14 g) has 15 mg. Lard -- 1 Tbsp (12.8 g) has 14 mg. Bacon grease -- 1 Tbsp (12.9 g) has 14 mg. Mayonnaise -- 1 Tbsp (13.8 g) has 5-10 mg. Margarine -- 1 Tbsp (14 g) has 3-10 mg. The items listed above may not be a complete list of foods with cholesterol. Exact amounts of cholesterol in these foods may vary depending on specific ingredients and brands. Contact a dietitian  for more information. What foods do not have cholesterol? Most plant-based foods do not have cholesterol unless you combine them with a food that has cholesterol. Foods without cholesterol include: Grains and cereals. Vegetables. Fruits. Vegetable oils, such as olive, canola, and sunflower oil. Legumes, such as peas, beans, and lentils. Nuts and seeds. Egg whites. The items listed above may not be a complete list of foods that do not have cholesterol. Contact a dietitian for more information. Summary The body needs cholesterol in small amounts, but too much cholesterol can cause damage to the arteries and heart. Cholesterol is found in animal-based foods, such as meat, seafood, and dairy. Generally, low-fat dairy and lean meats have less cholesterol than full-fat dairy and fatty meats. This information is not intended to replace advice given to you by your health care provider. Make sure you discuss any questions you have with your health care provider. Document Revised: 02/16/2021 Document Reviewed: 02/16/2021 Elsevier Patient Education  2024 [employer].

## 2023-12-19 ENCOUNTER — Encounter: Payer: Self-pay | Admitting: Occupational Therapy

## 2023-12-19 ENCOUNTER — Ambulatory Visit: Payer: Medicare Other | Admitting: Occupational Therapy

## 2023-12-19 ENCOUNTER — Ambulatory Visit: Payer: Medicare Other

## 2023-12-19 DIAGNOSIS — M6281 Muscle weakness (generalized): Secondary | ICD-10-CM

## 2023-12-19 DIAGNOSIS — I6381 Other cerebral infarction due to occlusion or stenosis of small artery: Secondary | ICD-10-CM | POA: Diagnosis not present

## 2023-12-19 DIAGNOSIS — R278 Other lack of coordination: Secondary | ICD-10-CM

## 2023-12-19 DIAGNOSIS — R41844 Frontal lobe and executive function deficit: Secondary | ICD-10-CM

## 2023-12-19 DIAGNOSIS — R2681 Unsteadiness on feet: Secondary | ICD-10-CM

## 2023-12-19 DIAGNOSIS — R2689 Other abnormalities of gait and mobility: Secondary | ICD-10-CM

## 2023-12-19 NOTE — Therapy (Signed)
 OUTPATIENT PHYSICAL THERAPY NEURO TREATMENT   Patient Name: Jordan Collier MRN: [id number] DOB:02/12/1946, 78 y.o., female Today's Date: 12/22/2023   PCP: Blenda Mounts REFERRING PROVIDER: Jacalyn Lefevre  END OF SESSION:  PT End of Session - 12/22/23 1015     Visit Number 4    Date for PT Re-Evaluation 02/24/24    Authorization Type BCBS    PT Start Time 1015    PT Stop Time 1100    PT Time Calculation (min) 45 min    Activity Tolerance Patient tolerated treatment well    Behavior During Therapy WFL for tasks assessed/performed                Past Medical History:  Diagnosis Date   Allergy to mold    Hypercholesterolemia    Psoriasis    Trigger finger, right middle finger    Past Surgical History:  Procedure Laterality Date   TRIGGER FINGER RELEASE Right 01/2023   TUBAL LIGATION N/A 1980   Patient Active Problem List   Diagnosis Date Noted   History of shingles 11/11/2023   Vitamin D insufficiency 11/04/2023   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   Hyperlipemia 10/27/2023   Stroke determined by clinical assessment (HCC) 10/24/2023    ONSET DATE: 10/24/23  REFERRING DIAG:  I63.81 (ICD-10-CM) - Thalamic stroke (HCC)    THERAPY DIAG:  Muscle weakness (generalized)  Frontal lobe and executive function deficit  Unsteadiness on feet  Other lack of coordination  Other abnormalities of gait and mobility  Rationale for Evaluation and Treatment: Rehabilitation  SUBJECTIVE:                                                                                                                                                                                             SUBJECTIVE STATEMENT:  I am anxious to return to my normal life. I am doing exercises, I am getting tired really easily.     EVAL: I am okay, a little pooped. My dexterity is off, I don't feel like I have the balance like I used to when I left the hospital.   Pt accompanied by: self  PERTINENT  HISTORY: [name] is a 78 y.o. female with history of hypercholesterolemia, herpes simplex, cataracts presenting 1/3 with acute onset of right-sided weakness. Patient called EMS after she had trouble getting out of her car due to her right leg weakness. CODE STROKE was activated by EMS.  Denies any hitroy of strokes, headaches, does not take blood thinners. CTH negative. Patient was unable to walk on assessment, even leaning to right while trying to simply sit up. Management with  thrombolytic therapy was explained to the patient, or patient's representative, as were risks, benefits and alternatives. All questions were answered. Patient, or patient's representative, expressed understanding of the treatment plan and agreed to proceed with thrombolytic treatment.  PAIN:  Are you having pain? No 0/10  PRECAUTIONS: Fall  RED FLAGS: None   WEIGHT BEARING RESTRICTIONS: No  FALLS: Has patient fallen in last 6 months? Yes. Number of falls 1 fell in the [facility] driveway Sept 5th  LIVING ENVIRONMENT: Lives with: lives alone Lives in: House/apartment Stairs:  has both an Engineer, structural or can take the stairs, normally she used to take the stairs  Has following equipment at home: Dan Humphreys - 4 wheeled  PLOF: Independent and Independent with basic ADLs  PATIENT GOALS: to be able to drive, go back to work, and go up and down steps   OBJECTIVE:  Note: Objective measures were completed at Evaluation unless otherwise noted.  DIAGNOSTIC FINDINGS:  FINDINGS: Brain: There is a 1.5 cm acute infarct involving the lateral aspect of the left thalamus and adjacent posterior limb of the internal capsule. A single chronic microhemorrhage is noted in the right frontal white matter. T2 hyperintensities in the cerebral white matter bilaterally are nonspecific but compatible with mild-to-moderate chronic small vessel ischemic disease. There is mild generalized cerebral atrophy.   Vascular: Major  intracranial vascular flow voids are preserved.   Skull and upper cervical spine: Unremarkable bone marrow signal.   Sinuses/Orbits: Bilateral cataract extraction. Paranasal sinuses and mastoid air cells are clear.   Other: None.   IMPRESSION: 1. Acute left thalamocapsular infarct. 2. 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 disease  COGNITION: Overall cognitive status: Within functional limits for tasks assessed   SENSATION: WFL  COORDINATION: Decreased coordination   POSTURE: rounded shoulders and forward head  LOWER EXTREMITY ROM:  all WFL    LOWER EXTREMITY MMT:  R hip flexion 3+/5 R knee extension 4-/5    TRANSFERS: Assistive device utilized: Environmental consultant - 4 wheeled  Sit to stand: CGA Stand to sit: SBA  STAIRS: Level of Assistance: CGA Stair Negotiation Technique: Step to Pattern with Single Rail on Right  GAIT: Gait pattern: step to pattern, decreased stance time- Right, decreased stride length, decreased hip/knee flexion- Right, ataxic, wide BOS, and poor foot clearance- Right Distance walked: in clinic distances Assistive device utilized: Environmental consultant - 4 wheeled Level of assistance: SBA   FUNCTIONAL TESTS:  5 times sit to stand: 19.81s Timed up and go (TUG): 16.25s Berg Balance Scale: 45/56                                                                                                                             TREATMENT DATE:  12/22/23 Bike L2 x42mins   Box taps 6" Shoulder ext 5# 2x10 AR press 5# 2x10 Resisted gait 10# 4 way x4  Leg press 20# 2x10  STS on airex 2x8 Horizontal abd red 2x10    [facility] Adult PT Treatment:  DATE: 12/19/23 Therapeutic Exercise: Seated LAQ 3# x15 B GTB around knees for eccentric STS  Therapeutic Activity: GTB around knees for side stepping - mod/max multimodal cues for forward gaze, erect trunk, hip/knee/ankle alignment, and control against resistance band Standing fingers  tips on sink: balance and strength alt LE 20 x marching, SL hip flex/ext/abd, toe raises, heel raises and mini squats 10 at sink  Side step ups on 6 in step with BUE on rail, eccentric focus Forward step ups on 6 in step with BUE on rail, eccentric focus    Rest breaks provided t/o session to address fatigue   12/16/23  Bridges + ABD into green TB 2x12  Sidelying clams green TB 2x12 B STS green TB x12 cues to keep tension on band and control stand to sit adequately   Nustep L6x8 minutes all four extremities for promotion of reciprocal motion, conditioning    Tandem stance blue foam pad 3x30 seconds B Narrow BOS EC 3x30 seconds       12/02/23- EVAL    PATIENT EDUCATION: Education details: POC, HEP, education about fall risk Person educated: Patient Education method: Explanation Education comprehension: verbalized understanding  HOME EXERCISE PROGRAM: Access Code: [email or web address] URL: [email or web address] Date: 12/02/2023 Prepared by: Cassie Freer  Exercises - Standing Tandem Balance with Counter Support  - 1 x daily - 7 x weekly - 2 sets - 10 reps - 15 hold - Standing Single Leg Stance with Counter Support  - 1 x daily - 7 x weekly - 2 sets - 10 reps - 10 hold - Standing Hip Abduction with Counter Support  - 1 x daily - 7 x weekly - 2 sets - 10 reps - Seated March with Resistance  - 1 x daily - 7 x weekly - 2 sets - 10 reps - Seated Hip Abduction with Resistance  - 1 x daily - 7 x weekly - 2 sets - 10 reps  GOALS: Goals reviewed with patient? Yes  SHORT TERM GOALS: Target date: 01/13/24  Patient will be independent with initial HEP. Baseline: given 12/02/23 Goal status: INITIAL  2.  Patient will demonstrate decreased fall risk by scoring < 14 sec on TUG. Baseline: 16.25s without walker Goal status: INITIAL    LONG TERM GOALS: Target date: 02/24/24  Patient will be independent with advanced/ongoing HEP to improve outcomes and carryover.  Baseline:   Goal status: INITIAL  2.  Patient will be able to ambulate 500' with normalized gait pattern and good safety to access community.  Baseline: using rollator and some short distances without, decreased hip flexion on R LE Goal status: INITIAL  3.  Patient will be able to step up/down a flight of steps to safely to access apartment  Baseline: was using stairs in apt prior to stroke Goal status: INITIAL   4.  Patient will demonstrate improved functional LE strength as demonstrated by 5xSTS <14s from chair height. Baseline: 19.81s Goal status: INITIAL  5.  Patient will score 53 on Berg Balance test to demonstrate lower risk of falls. (MCID= 8 points) Baseline: 45 Goal status: INITIAL  ASSESSMENT:  CLINICAL IMPRESSION: Pt has the most difficulty with STS on airex pad, cues needed to prevent weight shift on to heels in order to maintain balance and to go nose over toes. Worked on some postural strengthening as well as balance and LE strengthening. She is pleasant and motivated to participate in PT, able to perform all exercises and activities today.  As session progressed, pt demonstrated  RLE fatigue with decreased knee extension during initial contact of gait cycle, as well as decreased weight bearing RLE. She required multimodal cuing for proper form during eccentric exercises and side stepping, mainly due to weakness in R lateral hip stabilizers. Pt reports feeling exercises in her back and quads, but not her hips. She will continue to benefit from further skilled PT to progress hip/core stability, LE strength and balance to return to PLOF.     EVAL: Patient is a 78 y.o. female who was seen today for physical therapy evaluation and treatment for CVA on 10/24/23. Currently she is ambulating with a rollator and doing some walking without an AD safely around her apartment. She walks with decreased hip flexion and foot clearance on her RLE during swing phase of gait. With functional tests done  today she presents as moderate fall risk. Patient will benefit from skilled PT to address her balance, gait, and strength deficits to return to PLOF, be able to drive, and go back to work at [employer].    OBJECTIVE IMPAIRMENTS: Abnormal gait, decreased balance, decreased coordination, decreased endurance, difficulty walking, and decreased strength.   ACTIVITY LIMITATIONS: stairs, transfers, and locomotion level  PARTICIPATION LIMITATIONS: driving, shopping, community activity, and occupation  PERSONAL FACTORS: Transportation are also affecting patient's functional outcome.   REHAB POTENTIAL: Good  CLINICAL DECISION MAKING: Stable/uncomplicated  EVALUATION COMPLEXITY: Low  PLAN:  PT FREQUENCY: 2x/week  PT DURATION: 12 weeks  PLANNED INTERVENTIONS: 97110-Therapeutic exercises, 97530- Therapeutic activity, 97112- Neuromuscular re-education, 97535- Self Care, [id number]- Manual therapy, [phone]- Gait training, Patient/Family education, Balance training, and Stair training  PLAN FOR NEXT SESSION: start gym activities working on LE strength, hip/core stab, balance, and functional movements. Gait training, focusing on increasing R hip flexion. Work on general conditioning   Bettey Mare. Teal, PT, DPT 12/22/23 11:07 AM

## 2023-12-19 NOTE — Therapy (Signed)
 OUTPATIENT PHYSICAL THERAPY NEURO TREATMENT   Patient Name: Jordan Collier MRN: [id number] DOB:[birth year], 78 y.o., female Today's Date: 12/19/2023   PCP: Blenda Mounts REFERRING PROVIDER: Jacalyn Lefevre  END OF SESSION:  PT End of Session - 12/19/23 0946     Visit Number 3    Date for PT Re-Evaluation 02/24/24    Authorization Type BCBS    PT Start Time [phone]    PT Stop Time 0930    PT Time Calculation (min) 40 min    Activity Tolerance Patient tolerated treatment well    Behavior During Therapy [facility] for tasks assessed/performed               Past Medical History:  Diagnosis Date   Allergy to mold    Hypercholesterolemia    Psoriasis    Trigger finger, right middle finger    Past Surgical History:  Procedure Laterality Date   TRIGGER FINGER RELEASE Right 01/2023   TUBAL LIGATION N/A 1980   Patient Active Problem List   Diagnosis Date Noted   History of shingles 11/11/2023   Vitamin D insufficiency 11/04/2023   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 I'm having a little trouble managing my right leg, feeling a little pull in the knee at times." She is not using the rollator for the most part because she trips over it. She does use it when going to the bistro, as a shopping cart.       EVAL: I am okay, a little pooped. My dexterity is off, I don't feel like I have the  balance like I used to when I left the hospital.   Pt accompanied by: self  PERTINENT HISTORY: [name] is a 78 y.o. female with history of hypercholesterolemia, herpes simplex, cataracts presenting 1/3 with acute onset of right-sided weakness. Patient called EMS after she had trouble getting out of her car due to her right leg weakness. CODE STROKE was activated by EMS.  Denies any hitroy of strokes, headaches, does not take blood thinners.  CTH negative. Patient was unable to walk on assessment, even leaning to right while trying to simply sit up. Management with thrombolytic therapy was explained to the patient, or patient's representative, as were risks, benefits and alternatives. All questions were answered. Patient, or patient's representative, expressed understanding of the treatment plan and agreed to proceed with thrombolytic treatment.  PAIN:  Are you having pain? No 0/10  PRECAUTIONS: Fall  RED FLAGS: None   WEIGHT BEARING RESTRICTIONS: No  FALLS: Has patient fallen in last 6 months? Yes. Number of falls 1 fell in the [facility] driveway Sept 5th  LIVING ENVIRONMENT: Lives with: lives alone Lives in: House/apartment Stairs:  has both an Engineer, structural or can take the stairs, normally she used to take the stairs  Has following equipment at home: Dan Humphreys - 4 wheeled  PLOF: Independent and Independent with basic ADLs  PATIENT GOALS: to be able to drive, go back to work, and go up and down steps   OBJECTIVE:  Note: Objective measures were completed at Evaluation unless otherwise noted.  DIAGNOSTIC FINDINGS:  FINDINGS: Brain: There is a 1.5 cm acute infarct involving the lateral aspect of the left thalamus and adjacent posterior limb of the internal capsule. A single chronic microhemorrhage is noted in the right frontal white matter. T2 hyperintensities in the cerebral white matter bilaterally are nonspecific but compatible with mild-to-moderate chronic small vessel  ischemic disease. There is mild generalized cerebral atrophy.   Vascular: Major intracranial vascular flow voids are preserved.   Skull and upper cervical spine: Unremarkable bone marrow signal.   Sinuses/Orbits: Bilateral cataract extraction. Paranasal sinuses and mastoid air cells are clear.   Other: None.   IMPRESSION: 1. Acute left thalamocapsular infarct. 2. 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 disease  COGNITION: Overall cognitive status: Within functional limits for tasks assessed   SENSATION: WFL  COORDINATION: Decreased coordination   POSTURE: rounded shoulders and forward head  LOWER EXTREMITY ROM:  all WFL    LOWER EXTREMITY MMT:  R hip flexion 3+/5 R knee extension 4-/5    TRANSFERS: Assistive device utilized: Environmental consultant - 4 wheeled  Sit to stand: CGA Stand to sit: SBA  STAIRS: Level of Assistance: CGA Stair Negotiation Technique: Step to Pattern with Single Rail on Right  GAIT: Gait pattern: step to pattern, decreased stance time- Right, decreased stride length, decreased hip/knee flexion- Right, ataxic, wide BOS, and poor foot clearance- Right Distance walked: in clinic distances Assistive device utilized: Walker - 4 wheeled Level of assistance: SBA   FUNCTIONAL TESTS:  5 times sit to stand: 19.81s Timed up and go (TUG): 16.25s Berg Balance Scale: 45/56                                                                                                                             TREATMENT DATE:   [facility] Adult PT Treatment:  DATE: 12/19/23 Therapeutic Exercise: Seated LAQ 3# x15 B GTB around knees for eccentric STS  Therapeutic Activity: GTB around knees for side stepping - mod/max multimodal cues for forward gaze, erect trunk, hip/knee/ankle alignment, and control against resistance band Standing fingers tips on sink: balance and strength alt LE 20 x marching, SL hip flex/ext/abd, toe raises, heel  raises and mini squats 10 at sink  Side step ups on 6 in step with BUE on rail, eccentric focus Forward step ups on 6 in step with BUE on rail, eccentric focus    Rest breaks provided t/o session to address fatigue   12/16/23  Bridges + ABD into green TB 2x12  Sidelying clams green TB 2x12 B STS green TB x12 cues to keep tension on band and control stand to sit adequately   Nustep L6x8 minutes all four extremities for promotion of reciprocal motion, conditioning    Tandem stance blue foam pad 3x30 seconds B Narrow BOS EC 3x30 seconds       12/02/23- EVAL    PATIENT EDUCATION: Education details: POC, HEP, education about fall risk Person educated: Patient Education method: Explanation Education comprehension: verbalized understanding  HOME EXERCISE PROGRAM: Access Code: [email or web address] URL: [email or web address] Date: 12/02/2023 Prepared by: Cassie Freer  Exercises - Standing Tandem Balance with Counter Support  - 1 x daily - 7 x weekly - 2 sets - 10 reps - 15 hold - Standing Single Leg Stance with Counter Support  - 1 x daily - 7 x weekly - 2 sets - 10 reps - 10 hold - Standing Hip Abduction with Counter Support  - 1 x daily - 7 x weekly - 2 sets - 10 reps - Seated March with Resistance  - 1 x daily - 7 x weekly - 2 sets - 10 reps - Seated Hip Abduction with Resistance  - 1 x daily - 7 x weekly - 2 sets - 10 reps  GOALS: Goals reviewed with patient? Yes  SHORT TERM GOALS: Target date: 01/13/24  Patient will be independent with initial HEP. Baseline: given 12/02/23 Goal status: INITIAL  2.  Patient will demonstrate decreased fall risk by scoring < 14 sec on TUG. Baseline: 16.25s without walker Goal status: INITIAL    LONG TERM GOALS: Target date: 02/24/24  Patient will be independent with advanced/ongoing HEP to improve outcomes and carryover.  Baseline:  Goal status: INITIAL  2.  Patient will be able to ambulate 500' with normalized gait pattern and  good safety to access community.  Baseline: using rollator and some short distances without, decreased hip flexion on R LE Goal status: INITIAL  3.  Patient will be able to step up/down a flight of steps to safely to access apartment  Baseline: was using stairs in apt prior to stroke Goal status: INITIAL   4.  Patient will demonstrate improved functional LE strength as demonstrated by 5xSTS <14s from chair height. Baseline: 19.81s Goal status: INITIAL  5.  Patient will score 53 on Berg Balance test to demonstrate lower risk of falls. (MCID= 8 points) Baseline: 45 Goal status: INITIAL  ASSESSMENT:  CLINICAL IMPRESSION:   Pt presents with report of no longer using rollator, unless she is going to the bistro, where she uses it as a shopping cart. She is pleasant and motivated to participate in PT, able to perform all exercises and activities today. As session progressed, pt demonstrated RLE fatigue with decreased knee extension during initial contact of gait cycle, as well as decreased weight  bearing RLE. She required multimodal cuing for proper form during eccentric exercises and side stepping, mainly due to weakness in R lateral hip stabilizers. Pt reports feeling exercises in her back and quads, but not her hips. She will continue to benefit from further skilled PT to progress hip/core stability, LE strength and balance to return to PLOF.     EVAL: Patient is a 78 y.o. female who was seen today for physical therapy evaluation and treatment for CVA on 10/24/23. Currently she is ambulating with a rollator and doing some walking without an AD safely around her apartment. She walks with decreased hip flexion and foot clearance on her RLE during swing phase of gait. With functional tests done today she presents as moderate fall risk. Patient will benefit from skilled PT to address her balance, gait, and strength deficits to return to PLOF, be able to drive, and go back to work at [employer].    OBJECTIVE IMPAIRMENTS: Abnormal gait, decreased balance, decreased coordination, decreased endurance, difficulty walking, and decreased strength.   ACTIVITY LIMITATIONS: stairs, transfers, and locomotion level  PARTICIPATION LIMITATIONS: driving, shopping, community activity, and occupation  PERSONAL FACTORS: Transportation are also affecting patient's functional outcome.   REHAB POTENTIAL: Good  CLINICAL DECISION MAKING: Stable/uncomplicated  EVALUATION COMPLEXITY: Low  PLAN:  PT FREQUENCY: 2x/week  PT DURATION: 12 weeks  PLANNED INTERVENTIONS: 97110-Therapeutic exercises, 97530- Therapeutic activity, 97112- Neuromuscular re-education, 97535- Self Care, 91478- Manual therapy, [phone]- Gait training, Patient/Family education, Balance training, and Stair training  PLAN FOR NEXT SESSION: start gym activities working on LE strength, hip/core stab, balance, and functional movements. Gait training, focusing on increasing R hip flexion. Work on general conditioning   Bettey Mare. Scarleth Brame, PT, DPT 12/19/23 9:49 AM

## 2023-12-19 NOTE — Patient Instructions (Addendum)
    Coordination Activities  Perform the following activities for 15-20 minutes 1-2 times per day with right hand(s).  Rotate ball in fingertips (clockwise and counter-clockwise). Toss ball between hands. Toss ball in air and catch with the same hand. Flip cards 1 at a time as fast as you can. Deal cards with your thumb (Hold deck in hand and push card off top with thumb). Shuffle cards. Pick up coins and stack. Pick up coins one at a time until you get 5-10 in your hand, then move coins from palm to fingertips to stack one at a time. Continue to squeeze putty, roll out putty and pinch with each finger, pull putty apart.      Strengthening: Resisted Flexion   Attach tube to door.  Hold tubing with one arm at side. Pull forward and up with elbow straight. Move shoulder through pain-free range of motion, no further than shoulder height. Repeat 15 times per set.  Do 1-2 sessions per day.    Strengthening: Resisted Extension   Attach one end to door.  Hold tubing in one hand, arm forward. Pull arm back, elbow straight. Repeat 15 times per set. Do 1-2 sessions per day.   Resisted Horizontal Abduction: Bilateral   Sit or stand, tubing in both hands, palms down and arms out in front. Keeping arms straight, pinch shoulder blades together and stretch arms out. Repeat 15 times per set.  Do 1-2 sessions per day.   Elbow Flexion: Resisted   Hold tubing wrapped around  One hand and and other end secured under foot, curl arm up as far as possible keeping elbow down by your side. Repeat 15 times per set.  Do 1-2 sessions per day.   Triceps Extension (Frontal)    One arm forward at chest height and bent to 90, end of band in hand, other end secured on same side shoulder by other hand, extend arm slowly. Hold 2seconds. Repeat 15 times.  Do 1-2 sessions per day.

## 2023-12-22 ENCOUNTER — Ambulatory Visit: Payer: Medicare Other

## 2023-12-22 ENCOUNTER — Ambulatory Visit: Payer: Medicare Other | Attending: Registered Nurse | Admitting: Occupational Therapy

## 2023-12-22 ENCOUNTER — Encounter: Payer: Self-pay | Admitting: Occupational Therapy

## 2023-12-22 DIAGNOSIS — R41844 Frontal lobe and executive function deficit: Secondary | ICD-10-CM

## 2023-12-22 DIAGNOSIS — R278 Other lack of coordination: Secondary | ICD-10-CM

## 2023-12-22 DIAGNOSIS — R2681 Unsteadiness on feet: Secondary | ICD-10-CM

## 2023-12-22 DIAGNOSIS — R2689 Other abnormalities of gait and mobility: Secondary | ICD-10-CM | POA: Diagnosis present

## 2023-12-22 DIAGNOSIS — M6281 Muscle weakness (generalized): Secondary | ICD-10-CM | POA: Diagnosis present

## 2023-12-22 DIAGNOSIS — I6381 Other cerebral infarction due to occlusion or stenosis of small artery: Secondary | ICD-10-CM | POA: Diagnosis present

## 2023-12-22 NOTE — Telephone Encounter (Signed)
 I have emailed [employer] to help her get financial assistance for her [employer].  I called the patient and left her a VM explaining this.  Will continue to update

## 2023-12-22 NOTE — Therapy (Signed)
 OUTPATIENT OCCUPATIONAL THERAPY NEURO EVALUATION  Patient Name: Jordan Collier MRN: [id number] DOB:Sep 11, 1946, 78 y.o., female Today's Date: 12/22/2023  PCP: Dr. Harrison Mons REFERRING PROVIDER: Jacalyn Lefevre, NP/ Dr. Carlis Abbott  END OF SESSION:  OT End of Session - 12/22/23 0940     Visit Number 3    Number of Visits 25    Date for OT Re-Evaluation 02/24/24    Authorization Type BCBS MCR    Authorization - Visit Number 3    Progress Note Due on Visit 10    OT Start Time 0931    OT Stop Time 1010    OT Time Calculation (min) 39 min               Past Medical History:  Diagnosis Date   Allergy to mold    Hypercholesterolemia    Psoriasis    Trigger finger, right middle finger    Past Surgical History:  Procedure Laterality Date   TRIGGER FINGER RELEASE Right 01/2023   TUBAL LIGATION N/A 1980   Patient Active Problem List   Diagnosis Date Noted   History of shingles 11/11/2023   Vitamin D insufficiency 11/04/2023   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  TNK administered.  MRI (+) Acute left thalamocapsular infarct. PMH; hyperlipidemia       PRECAUTIONS: Fall  WEIGHT BEARING RESTRICTIONS: No  PAIN:  no   FALLS: Has patient fallen in last 6 months? No  LIVING ENVIRONMENT: Lives with: lives alone Lives in: House/apartment Stairs: No, has elevator Has following  equipment at home:  rollator  PLOF: Independent  PATIENT GOALS: improve function in RUE  OBJECTIVE:  Note: Objective measures were completed at Evaluation unless otherwise noted.  HAND DOMINANCE: LUE  ADLs: Overall ADLs: modified Independent Transfers/ambulation related to ADLs:mod I with rollator  Tub Shower transfers: walk in shower with seat Equipment:  rollator  IADLs:  Meal Prep: has cooked 1 meal however she is able to go dining room/ restaurant at [employer]   MOBILITY STATUS:  mod I with rollator    ACTIVITY TOLERANCE: Activity tolerance: fatigues more quickly   FUNCTIONAL OUTCOME MEASURES: Quick Dash: 36% disability  UPPER EXTREMITY ROM:  WFLS for LUE, grossly WFLs for RUE with exception of internal rotation/ difficulty reaching behind back   Active ROM Right eval Left eval  Shoulder flexion    Shoulder abduction    Shoulder adduction    Shoulder extension    Shoulder internal rotation  grossly 75%  Shoulder external rotation    Elbow flexion    Elbow extension    Wrist flexion    Wrist extension    Wrist ulnar deviation    Wrist radial deviation    Wrist pronation    Wrist supination    (Blank rows = not tested)  UPPER EXTREMITY MMT:     MMT Right eval Left eval  Shoulder flexion 3+/5 4+/5  Shoulder abduction  Shoulder adduction    Shoulder extension    Shoulder internal rotation    Shoulder external rotation    Middle trapezius    Lower trapezius    Elbow flexion 4/5 4+/5  Elbow extension 4/5 4+/5  Wrist flexion    Wrist extension    Wrist ulnar deviation    Wrist radial deviation    Wrist pronation    Wrist supination    (Blank rows = not tested)  HAND FUNCTION: Grip strength: Right: 30 lbs; Left: 45 lbs  COORDINATION: 9 Hole Peg test: Right: 35.17 sec; Left: 28.32 sec, Box/ blocks: RUE: 45 blocks, LUE 55 blocks  SENSATION: WFL    COGNITION: Overall cognitive status:  delayed processing per pt report. Pt  spells"WORLD" backwards correctly, pt was able to correctly perfrom clock drawing task   VISION ASSESSMENT: Not tested- denies visual changes from CVA      OBSERVATIONS: 78 y.o female is highly motivated to improve so she can return to work                                                                                                                             TREATMENT DATE: 12/22/23-Reviewed yellow theraband HEP, 10-15 reps each for bilateral UE's mod v.c and demonstration. Ambulating while tossing ball between hands, then perfroming category generation for naming food for each letter of the alphabet, min-mod v.c for generating foods, min v.c for balance  discussion with pt about how divided/ alternating attention would impact driving and therapist reccomeds addional work in this area prior to driving as pt demonstrated instability at time and required increased v.c for category generation Copying small peg design with RUE for coordination with a visual component, pt was able to hold several pegs in her hand to manipulate while placing in the peg board, min difficulty.   12/19/23:  Pt instructed in coordination HEP and yellow theraband HEP--see pt education section.  12/02/23- eval completed , see education      PATIENT EDUCATION: Education details:yellow theraband review Person educated: Patient Education method: Explanation, Demonstration, Verbal cues,  Education comprehension: verbalized understanding, returned demonstration  HOME EXERCISE PROGRAM: Previously was issued putty HEP 12/18/22:  Coordination HEP, Yellow theraband HEP   GOALS: Goals reviewed with patient? Yes  SHORT TERM GOALS: Target date: 12/29/23  I with inital HEP  Goal status: ongoing, 12/22/23  2.  Pt will demonstrate improved RUE functional use for ADLs as evidenced by increasing box/ blocks score by 4 blocks.  Goal status: ongoing, 12/22/23  3.   Pt. will demonstrate ability to perfrom a physical  and cognitive task simultaneously  with 90% or better accuracy in prep for return to work and driving.  Goal status: onogin   4.  Pt will increase RUE grip strength by 5 lbs for increased RUE functional use.  Goal status: INITIAL  5.  Pt will report increased ease with separating paper/ pages with RUE.  Goal status: INITIAL  6.  Pt will bathe her back modified indpendently with RUE.  Goal status: INITIAL  LONG TERM GOALS: Target date: 02/24/24  I with updated HEP.  Goal status: INITIAL  2.  Pt will demonstrate improved fine motor coordination for ADLs as evidenced by decreasing RUE 9 hole peg test score by 3 secs.  Goal status: INITIAL  3.  Pt will perform simulated work activities modified independently.  Goal status: INITIAL  4.  Pt will report increased ease and accuracy with typing.  Goal status: INITIAL  5.  Pt will demonstrate ability to retrieve 3 lbs from overhead shelf with good control. Baseline:  Goal status: INITIAL   ASSESSMENT:  CLINICAL IMPRESSION: Pt is progressing towards goals.  Pt demonstrates understanding of theraband HEP. She can benefit from addition work an divided/ alternating attention.  PERFORMANCE DEFICITS: in functional skills including ADLs, IADLs, coordination, dexterity, ROM, strength, pain, flexibility, Fine motor control, Gross motor control, mobility, balance, endurance, decreased knowledge of precautions, decreased knowledge of use of DME, and UE functional use, cognitive skills including attention, safety awareness, and thought, and psychosocial skills including coping strategies, environmental adaptation, habits, interpersonal interactions, and routines and behaviors.   IMPAIRMENTS: are limiting patient from ADLs, IADLs, work, play, leisure, and social participation.   CO-MORBIDITIES: may have co-morbidities  that affects occupational performance. Patient will benefit from skilled OT to address above impairments and improve overall  function.  MODIFICATION OR ASSISTANCE TO COMPLETE EVALUATION: No modification of tasks or assist necessary to complete an evaluation.  OT OCCUPATIONAL PROFILE AND HISTORY: Detailed assessment: Review of records and additional review of physical, cognitive, psychosocial history related to current functional performance.  CLINICAL DECISION MAKING: LOW - limited treatment options, no task modification necessary  REHAB POTENTIAL: Good  EVALUATION COMPLEXITY: Low    PLAN:  OT FREQUENCY: 2x/week  OT DURATION: 12 weeks, will likely d/c after 6-8 weeks dependent on progress.  PLANNED INTERVENTIONS: 97168 OT Re-evaluation, 97535 self care/ADL training, 16109 therapeutic exercise, 97530 therapeutic activity, 97112 neuromuscular re-education, 97140 manual therapy, 97116 gait training, 60454 aquatic therapy, 97035 ultrasound, 97018 paraffin, 09811 moist heat, 97010 cryotherapy, 97129 Cognitive training (first 15 min), 91478 Cognitive training(each additional 15 min), passive range of motion, balance training, stair training, functional mobility training, psychosocial skills training, energy conservation, coping strategies training, patient/family education, and DME and/or AE instructions  RECOMMENDED OTHER SERVICES: PT  CONSULTED AND AGREED WITH PLAN OF CARE: Patient  PLAN FOR NEXT SESSION:  continue with coordination, divided attention., UE control   pt is highly motivated to improve and to return to work at [employer], OTR/L 12/22/2023, 1:07 PM

## 2023-12-22 NOTE — Telephone Encounter (Signed)
 Patient has been enrolled in the [employer].  I called and gave her this information. She will call us back in a couple weeks to schedule her Leqvio injection if she wants to proceed after speaking to her doctor.

## 2023-12-23 NOTE — Therapy (Signed)
 OUTPATIENT PHYSICAL THERAPY NEURO TREATMENT   Patient Name: Jordan Collier MRN: [id number] DOB:1946-04-15, 78 y.o., female Today's Date: 12/24/2023   PCP: Jordan Collier REFERRING PROVIDER: Jacalyn Collier  END OF SESSION:  PT End of Session - 12/24/23 1400     Visit Number 5    Date for PT Re-Evaluation 02/24/24    Authorization Type BCBS    PT Start Time 1400    PT Stop Time 1445    PT Time Calculation (min) 45 min    Activity Tolerance Patient tolerated treatment well    Behavior During Therapy WFL for tasks assessed/performed                 Past Medical History:  Diagnosis Date   Allergy to mold    Hypercholesterolemia    Psoriasis    Trigger finger, right middle finger    Past Surgical History:  Procedure Laterality Date   TRIGGER FINGER RELEASE Right 01/2023   TUBAL LIGATION N/A 1980   Patient Active Problem List   Diagnosis Date Noted   History of shingles 11/11/2023   Vitamin D insufficiency 11/04/2023   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   Hyperlipemia 10/27/2023   Stroke determined by clinical assessment (HCC) 10/24/2023    ONSET DATE: 10/24/23  REFERRING DIAG:  I63.81 (ICD-10-CM) - Thalamic stroke (HCC)    THERAPY DIAG:  Other abnormalities of gait and mobility  Muscle weakness (generalized)  Unsteadiness on feet  Other lack of coordination  Rationale for Evaluation and Treatment: Rehabilitation  SUBJECTIVE:                                                                                                                                                                                             SUBJECTIVE STATEMENT: I am tired. Had a busy day so far.    EVAL: I am okay, a little pooped. My dexterity is off, I don't feel like I have the balance like I used to when I left the hospital.   Pt accompanied by: self  PERTINENT HISTORY: Jordan Collier is a 78 y.o. female with history of hypercholesterolemia, herpes simplex,  cataracts presenting 1/3 with acute onset of right-sided weakness. Patient called EMS after she had trouble getting out of her car due to her right leg weakness. CODE STROKE was activated by EMS.  Denies any hitroy of strokes, headaches, does not take blood thinners. CTH negative. Patient was unable to walk on assessment, even leaning to right while trying to simply sit up. Management with thrombolytic therapy was explained to the patient, or patient's representative, as were risks, benefits and alternatives. All questions  were answered. Patient, or patient's representative, expressed understanding of the treatment plan and agreed to proceed with thrombolytic treatment.  PAIN:  Are you having pain? No 0/10  PRECAUTIONS: Fall  RED FLAGS: None   WEIGHT BEARING RESTRICTIONS: No  FALLS: Has patient fallen in last 6 months? Yes. Number of falls 1 fell in the [facility] driveway Sept 5th  LIVING ENVIRONMENT: Lives with: lives alone Lives in: House/apartment Stairs:  has both an Engineer, structural or can take the stairs, normally she used to take the stairs  Has following equipment at home: Dan Humphreys - 4 wheeled  PLOF: Independent and Independent with basic ADLs  PATIENT GOALS: to be able to drive, go back to work, and go up and down steps   OBJECTIVE:  Note: Objective measures were completed at Evaluation unless otherwise noted.  DIAGNOSTIC FINDINGS:  FINDINGS: Brain: There is a 1.5 cm acute infarct involving the lateral aspect of the left thalamus and adjacent posterior limb of the internal capsule. A single chronic microhemorrhage is noted in the right frontal white matter. T2 hyperintensities in the cerebral white matter bilaterally are nonspecific but compatible with mild-to-moderate chronic small vessel ischemic disease. There is mild generalized cerebral atrophy.   Vascular: Major intracranial vascular flow voids are preserved.   Skull and upper cervical spine: Unremarkable bone marrow  signal.   Sinuses/Orbits: Bilateral cataract extraction. Paranasal sinuses and mastoid air cells are clear.   Other: None.   IMPRESSION: 1. Acute left thalamocapsular infarct. 2. 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 disease  COGNITION: Overall cognitive status: Within functional limits for tasks assessed   SENSATION: WFL  COORDINATION: Decreased coordination   POSTURE: rounded shoulders and forward head  LOWER EXTREMITY ROM:  all WFL    LOWER EXTREMITY MMT:  R hip flexion 3+/5 R knee extension 4-/5    TRANSFERS: Assistive device utilized: Environmental consultant - 4 wheeled  Sit to stand: CGA Stand to sit: SBA  STAIRS: Level of Assistance: CGA Stair Negotiation Technique: Step to Pattern with Single Rail on Right  GAIT: Gait pattern: step to pattern, decreased stance time- Right, decreased stride length, decreased hip/knee flexion- Right, ataxic, wide BOS, and poor foot clearance- Right Distance walked: in clinic distances Assistive device utilized: Environmental consultant - 4 wheeled Level of assistance: SBA   FUNCTIONAL TESTS:  5 times sit to stand: 19.81s Timed up and go (TUG): 16.25s Berg Balance Scale: 45/56                                                                                                                             TREATMENT DATE:  12/24/23 Recheck TUG  Calf raises 2x10  Calf stretch 30s on slant  NuStep L5 x25mins  Leg ext 5# 2x10 HS curls 20# 2x10 Lateral band walks Monster walks  Step ups 6"  Walking on beam   12/22/23 Bike L2 x19mins   Box taps 6" Shoulder ext 5# 2x10 AR press 5# 2x10 Resisted gait 10# 4  way x4  Leg press 20# 2x10  STS on airex 2x8 Horizontal abd red 2x10    [facility] Adult PT Treatment:                                                DATE: 12/19/23 Therapeutic Exercise: Seated LAQ 3# x15 B GTB around knees for eccentric STS  Therapeutic Activity: GTB around knees for side stepping - mod/max multimodal cues for forward gaze, erect  trunk, hip/knee/ankle alignment, and control against resistance band Standing fingers tips on sink: balance and strength alt LE 20 x marching, SL hip flex/ext/abd, toe raises, heel raises and mini squats 10 at sink  Side step ups on 6 in step with BUE on rail, eccentric focus Forward step ups on 6 in step with BUE on rail, eccentric focus    Rest breaks provided t/o session to address fatigue   12/16/23  Bridges + ABD into green TB 2x12  Sidelying clams green TB 2x12 B STS green TB x12 cues to keep tension on band and control stand to sit adequately   Nustep L6x8 minutes all four extremities for promotion of reciprocal motion, conditioning    Tandem stance blue foam pad 3x30 seconds B Narrow BOS EC 3x30 seconds       12/02/23- EVAL    PATIENT EDUCATION: Education details: POC, HEP, education about fall risk Person educated: Patient Education method: Explanation Education comprehension: verbalized understanding  HOME EXERCISE PROGRAM: Access Code: [email or web address] URL: [email or web address] Date: 12/02/2023 Prepared by: Cassie Freer  Exercises - Standing Tandem Balance with Counter Support  - 1 x daily - 7 x weekly - 2 sets - 10 reps - 15 hold - Standing Single Leg Stance with Counter Support  - 1 x daily - 7 x weekly - 2 sets - 10 reps - 10 hold - Standing Hip Abduction with Counter Support  - 1 x daily - 7 x weekly - 2 sets - 10 reps - Seated March with Resistance  - 1 x daily - 7 x weekly - 2 sets - 10 reps - Seated Hip Abduction with Resistance  - 1 x daily - 7 x weekly - 2 sets - 10 reps  GOALS: Goals reviewed with patient? Yes  SHORT TERM GOALS: Target date: 01/13/24  Patient will be independent with initial HEP. Baseline: given 12/02/23 Goal status: MET 12/24/23  2.  Patient will demonstrate decreased fall risk by scoring < 14 sec on TUG. Baseline: 16.25s without walker Goal status: MET 10.67s    LONG TERM GOALS: Target date: 02/24/24  Patient will  be independent with advanced/ongoing HEP to improve outcomes and carryover.  Baseline:  Goal status: INITIAL  2.  Patient will be able to ambulate 500' with normalized gait pattern and good safety to access community.  Baseline: using rollator and some short distances without, decreased hip flexion on R LE Goal status: INITIAL  3.  Patient will be able to step up/down a flight of steps to safely to access apartment  Baseline: was using stairs in apt prior to stroke Goal status: INITIAL   4.  Patient will demonstrate improved functional LE strength as demonstrated by 5xSTS <14s from chair height. Baseline: 19.81s Goal status: INITIAL  5.  Patient will score 53 on Berg Balance test to demonstrate lower risk of falls. (MCID= 8 points) Baseline: 45 Goal  status: INITIAL  ASSESSMENT:  CLINICAL IMPRESSION: Patient is doing well, is compliant with her HEP. She states she feels loose today so we focused mostly on strengthening especially for lower extremities. She does well with the strengthening but has more difficulty with balance on the beam. Will try to do more balance interventions next visit.   As session progressed, pt demonstrated RLE fatigue with decreased knee extension during initial contact of gait cycle, as well as decreased weight bearing RLE. She required multimodal cuing for proper form during eccentric exercises and side stepping, mainly due to weakness in R lateral hip stabilizers. Pt reports feeling exercises in her back and quads, but not her hips. She will continue to benefit from further skilled PT to progress hip/core stability, LE strength and balance to return to PLOF.     EVAL: Patient is a 78 y.o. female who was seen today for physical therapy evaluation and treatment for CVA on 10/24/23. Currently she is ambulating with a rollator and doing some walking without an AD safely around her apartment. She walks with decreased hip flexion and foot clearance on her RLE during  swing phase of gait. With functional tests done today she presents as moderate fall risk. Patient will benefit from skilled PT to address her balance, gait, and strength deficits to return to PLOF, be able to drive, and go back to work at [employer].    OBJECTIVE IMPAIRMENTS: Abnormal gait, decreased balance, decreased coordination, decreased endurance, difficulty walking, and decreased strength.   ACTIVITY LIMITATIONS: stairs, transfers, and locomotion level  PARTICIPATION LIMITATIONS: driving, shopping, community activity, and occupation  PERSONAL FACTORS: Transportation are also affecting patient's functional outcome.   REHAB POTENTIAL: Good  CLINICAL DECISION MAKING: Stable/uncomplicated  EVALUATION COMPLEXITY: Low  PLAN:  PT FREQUENCY: 2x/week  PT DURATION: 12 weeks  PLANNED INTERVENTIONS: 97110-Therapeutic exercises, 97530- Therapeutic activity, 97112- Neuromuscular re-education, 97535- Self Care, 29562- Manual therapy, [phone]- Gait training, Patient/Family education, Balance training, and Stair training  PLAN FOR NEXT SESSION: start gym activities working on LE strength, hip/core stab, balance, and functional movements. Gait training, focusing on increasing R hip flexion. Work on general conditioning   Cassie Freer, PT, DPT 12/24/23 2:45 PM

## 2023-12-24 ENCOUNTER — Encounter: Payer: Self-pay | Admitting: Occupational Therapy

## 2023-12-24 ENCOUNTER — Ambulatory Visit: Payer: Medicare Other

## 2023-12-24 ENCOUNTER — Ambulatory Visit: Payer: Medicare Other | Admitting: Occupational Therapy

## 2023-12-24 DIAGNOSIS — R2681 Unsteadiness on feet: Secondary | ICD-10-CM

## 2023-12-24 DIAGNOSIS — M6281 Muscle weakness (generalized): Secondary | ICD-10-CM

## 2023-12-24 DIAGNOSIS — R2689 Other abnormalities of gait and mobility: Secondary | ICD-10-CM

## 2023-12-24 DIAGNOSIS — R278 Other lack of coordination: Secondary | ICD-10-CM

## 2023-12-24 DIAGNOSIS — R41844 Frontal lobe and executive function deficit: Secondary | ICD-10-CM

## 2023-12-24 NOTE — Therapy (Signed)
 OUTPATIENT OCCUPATIONAL THERAPY NEURO EVALUATION  Patient Name: Jordan Collier MRN: [id number] DOB:[birth year], 78 y.o., female Today's Date: 12/24/2023  PCP: Dr. Harrison Mons REFERRING PROVIDER: Jacalyn Lefevre, NP/ Dr. Carlis Abbott  END OF SESSION:  OT End of Session - 12/24/23 1325     Visit Number 4    Number of Visits 25    Date for OT Re-Evaluation 02/24/24    Authorization Type BCBS MCR    Authorization - Visit Number 4    Progress Note Due on Visit 10    OT Start Time 1321    OT Stop Time 1400    OT Time Calculation (min) 39 min    Activity Tolerance Patient tolerated treatment well    Behavior During Therapy WFL for tasks assessed/performed               Past Medical History:  Diagnosis Date   Allergy to mold    Hypercholesterolemia    Psoriasis    Trigger finger, right middle finger    Past Surgical History:  Procedure Laterality Date   TRIGGER FINGER RELEASE Right 01/2023   TUBAL LIGATION N/A 1980   Patient Active Problem List   Diagnosis Date Noted   History of shingles 11/11/2023   Vitamin D insufficiency 11/04/2023   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   Hyperlipemia 10/27/2023   Stroke determined by clinical assessment (HCC) 10/24/2023    ONSET DATE: 10/24/23  REFERRING DIAG:  Diagnosis  I63.81 (ICD-10-CM) - Thalamic stroke (HCC)    THERAPY DIAG:  Muscle weakness (generalized)  Frontal lobe and executive function deficit  Unsteadiness on feet  Other lack of coordination  Other abnormalities of gait and mobility  Rationale for Evaluation and Treatment: Rehabilitation  SUBJECTIVE:   SUBJECTIVE STATEMENT: Pt reports she exercises all day yesterday and that she is feeling a little overwhelmed  Pt accompanied by: self  PERTINENT HISTORY: Pt is a 78 y/o female presenting 1/3 with sudden onset of R sided weakness.  TNK administered.  MRI (+) Acute left thalamocapsular infarct. PMH; hyperlipidemia       PRECAUTIONS: Fall  WEIGHT BEARING  RESTRICTIONS: No  PAIN:  no   FALLS: Has patient fallen in last 6 months? No  LIVING ENVIRONMENT: Lives with: lives alone Lives in: House/apartment Stairs: No, has elevator Has following equipment at home:  rollator  PLOF: Independent  PATIENT GOALS: improve function in RUE  OBJECTIVE:  Note: Objective measures were completed at Evaluation unless otherwise noted.  HAND DOMINANCE: LUE  ADLs: Overall ADLs: modified Independent Transfers/ambulation related to ADLs:mod I with rollator  Tub Shower transfers: walk in shower with seat Equipment:  rollator  IADLs:  Meal Prep: has cooked 1 meal however she is able to go dining room/ restaurant at [employer]   MOBILITY STATUS:  mod I with rollator    ACTIVITY TOLERANCE: Activity tolerance: fatigues more quickly   FUNCTIONAL OUTCOME MEASURES: Quick Dash: 36% disability  UPPER EXTREMITY ROM:  WFLS for LUE, grossly WFLs for RUE with exception of internal rotation/ difficulty reaching behind back   Active ROM Right eval Left eval  Shoulder flexion    Shoulder abduction    Shoulder adduction    Shoulder extension    Shoulder internal rotation  grossly 75%  Shoulder external rotation    Elbow flexion    Elbow extension    Wrist flexion    Wrist extension    Wrist ulnar deviation    Wrist radial deviation    Wrist pronation  Wrist supination    (Blank rows = not tested)  UPPER EXTREMITY MMT:     MMT Right eval Left eval  Shoulder flexion 3+/5 4+/5  Shoulder abduction    Shoulder adduction    Shoulder extension    Shoulder internal rotation    Shoulder external rotation    Middle trapezius    Lower trapezius    Elbow flexion 4/5 4+/5  Elbow extension 4/5 4+/5  Wrist flexion    Wrist extension    Wrist ulnar deviation    Wrist radial deviation    Wrist pronation    Wrist supination    (Blank rows = not tested)  HAND FUNCTION: Grip strength: Right: 30 lbs; Left: 45 lbs  COORDINATION: 9 Hole  Peg test: Right: 35.17 sec; Left: 28.32 sec, Box/ blocks: RUE: 45 blocks, LUE 55 blocks  SENSATION: WFL    COGNITION: Overall cognitive status:  delayed processing per pt report. Pt spells"WORLD" backwards correctly, pt was able to correctly perfrom clock drawing task   VISION ASSESSMENT: Not tested- denies visual changes from CVA      OBSERVATIONS: 78 y.o female is highly motivated to improve so she can return to work                                                                                                                             TREATMENT DATE: 12/24/23- UBE x 6 mins level 3 for conditioning. Therapist upgraded putty to red and reveiwed HEP. Placing and removing grooved pegs from pegboard withh RUE for improved fine motor coordiantion, min v.c  Today her grip is 40 lbs for left and right UE's 3# Weighted cane for shoulder flexion and biceps curls 15 reps each min v.c  Pt was provided with an exercise flow sheet and therapist made recommendations regarding frequency so that pt is not overwhelmed. Pt verbalized understanding.  12/22/23-Reviewed yellow theraband HEP, 10-15 reps each for bilateral UE's mod v.c and demonstration. Ambulating while tossing ball between hands, then perfroming category generation for naming food for each letter of the alphabet, min-mod v.c for generating foods, min v.c for balance  discussion with pt about how divided/ alternating attention would impact driving and therapist recommends additional work in this area prior to driving as pt demonstrated instability at times and required increased v.c for category generation Copying small peg design with RUE for coordination with a visual component, pt was able to hold several pegs in her hand to manipulate while placing in the peg board, min difficulty.   12/19/23:  Pt instructed in coordination HEP and yellow theraband HEP--see pt education section.  12/02/23- eval completed , see  education      PATIENT EDUCATION: Education details:exercise fleow sheet Person educated: Patient Education method: Explanation, Demonstration, Verbal cues, handout Education comprehension: verbalized understanding, returned demonstration  HOME EXERCISE PROGRAM: Previously was issued putty HEP 12/18/22:  Coordination HEP, Yellow theraband HEP   GOALS: Goals reviewed with patient? Yes  SHORT TERM GOALS: Target date: 12/29/23  I with inital HEP  Goal status: met, 12/24/23  2.  Pt will demonstrate improved RUE functional use for ADLs as evidenced by increasing box/ blocks score by 4 blocks.  Goal status: ongoing, 12/22/23  3.   Pt. will demonstrate ability to perfrom a physical and cognitive task simultaneously  with 90% or better accuracy in prep for return to work and driving.  Goal status: onogin   4.  Pt will increase RUE grip strength by 5 lbs for increased RUE functional use.  Goal status: met, 12/24/23  5.  Pt will report increased ease with separating paper/ pages with RUE.  Goal status: ongoing  6.  Pt will bathe her back modified indpendently with RUE.  Goal status: met, per pt report, pt demonstrates ability to perform 12/24/23  LONG TERM GOALS: Target date: 02/24/24  I with updated HEP.  Goal status: INITIAL  2.  Pt will demonstrate improved fine motor coordination for ADLs as evidenced by decreasing RUE 9 hole peg test score by 3 secs.  Goal status: INITIAL  3.  Pt will perform simulated work activities modified independently.  Goal status: INITIAL  4.  Pt will report increased ease and accuracy with typing.  Goal status: INITIAL  5.  Pt will demonstrate ability to retrieve 3 lbs from overhead shelf with good control. Baseline:  Goal status: INITIAL   ASSESSMENT:  CLINICAL IMPRESSION: Pt is progressing towards goals with improving grip strength. Pt demonstrates understanding of exercise flow sheet.  PERFORMANCE DEFICITS: in functional skills  including ADLs, IADLs, coordination, dexterity, ROM, strength, pain, flexibility, Fine motor control, Gross motor control, mobility, balance, endurance, decreased knowledge of precautions, decreased knowledge of use of DME, and UE functional use, cognitive skills including attention, safety awareness, and thought, and psychosocial skills including coping strategies, environmental adaptation, habits, interpersonal interactions, and routines and behaviors.   IMPAIRMENTS: are limiting patient from ADLs, IADLs, work, play, leisure, and social participation.   CO-MORBIDITIES: may have co-morbidities  that affects occupational performance. Patient will benefit from skilled OT to address above impairments and improve overall function.  MODIFICATION OR ASSISTANCE TO COMPLETE EVALUATION: No modification of tasks or assist necessary to complete an evaluation.  OT OCCUPATIONAL PROFILE AND HISTORY: Detailed assessment: Review of records and additional review of physical, cognitive, psychosocial history related to current functional performance.  CLINICAL DECISION MAKING: LOW - limited treatment options, no task modification necessary  REHAB POTENTIAL: Good  EVALUATION COMPLEXITY: Low    PLAN:  OT FREQUENCY: 2x/week  OT DURATION: 12 weeks, will likely d/c after 6-8 weeks dependent on progress.  PLANNED INTERVENTIONS: 97168 OT Re-evaluation, 97535 self care/ADL training, 10272 therapeutic exercise, 97530 therapeutic activity, 97112 neuromuscular re-education, 97140 manual therapy, 97116 gait training, 53664 aquatic therapy, 97035 ultrasound, 97018 paraffin, 40347 moist heat, 97010 cryotherapy, 97129 Cognitive training (first 15 min), 42595 Cognitive training(each additional 15 min), passive range of motion, balance training, stair training, functional mobility training, psychosocial skills training, energy conservation, coping strategies training, patient/family education, and DME and/or AE  instructions  RECOMMENDED OTHER SERVICES: PT  CONSULTED AND AGREED WITH PLAN OF CARE: Patient  PLAN FOR NEXT SESSION:  review/ upgrade band exercises, dynimic functional reaching or peg design on vertical surface.   pt is highly motivated to improve and to return to work at [employer], OTR/L 12/24/2023, 1:26 PM

## 2023-12-24 NOTE — Patient Instructions (Addendum)
(  Exercise) Monday Tuesday Wednesday Thursday Friday Saturday Sunday  UE theraband exercises -OT every other day          LB exercises- PT  every othe day            red putty  3x week          coordination activities 3x week          nu-step   daily           yoga class

## 2023-12-26 NOTE — Progress Notes (Signed)
 I agree with the above plan

## 2023-12-29 ENCOUNTER — Ambulatory Visit: Payer: Medicare Other

## 2023-12-29 ENCOUNTER — Ambulatory Visit: Payer: Medicare Other | Admitting: Occupational Therapy

## 2023-12-29 DIAGNOSIS — R278 Other lack of coordination: Secondary | ICD-10-CM

## 2023-12-29 DIAGNOSIS — M6281 Muscle weakness (generalized): Secondary | ICD-10-CM

## 2023-12-29 DIAGNOSIS — R2681 Unsteadiness on feet: Secondary | ICD-10-CM

## 2023-12-29 DIAGNOSIS — R2689 Other abnormalities of gait and mobility: Secondary | ICD-10-CM

## 2023-12-29 NOTE — Therapy (Signed)
 OUTPATIENT PHYSICAL THERAPY NEURO TREATMENT   Patient Name: Jordan Collier MRN: [id number] DOB:22-May-1946, 78 y.o., female Today's Date: 12/29/2023   PCP: Jordan Collier REFERRING PROVIDER: Jacalyn Collier  END OF SESSION:  PT End of Session - 12/29/23 1059     Visit Number 6    Date for PT Re-Evaluation 02/24/24    Authorization Type BCBS    PT Start Time 1100    PT Stop Time 1145    PT Time Calculation (min) 45 min    Activity Tolerance Patient tolerated treatment well    Behavior During Therapy WFL for tasks assessed/performed                  Past Medical History:  Diagnosis Date   Allergy to mold    Hypercholesterolemia    Psoriasis    Trigger finger, right middle finger    Past Surgical History:  Procedure Laterality Date   TRIGGER FINGER RELEASE Right 01/2023   TUBAL LIGATION N/A 1980   Patient Active Problem List   Diagnosis Date Noted   History of shingles 11/11/2023   Vitamin D insufficiency 11/04/2023   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   Hyperlipemia 10/27/2023   Stroke determined by clinical assessment (HCC) 10/24/2023    ONSET DATE: 10/24/23  REFERRING DIAG:  I63.81 (ICD-10-CM) - Thalamic stroke (HCC)    THERAPY DIAG:  Other abnormalities of gait and mobility  Muscle weakness (generalized)  Unsteadiness on feet  Other lack of coordination  Rationale for Evaluation and Treatment: Rehabilitation  SUBJECTIVE:                                                                                                                                                                                             SUBJECTIVE STATEMENT: Nothing new. I have been experimenting with my feet and toes using different materials to increase sensory input into my feet.   EVAL: I am okay, a little pooped. My dexterity is off, I don't feel like I have the balance like I used to when I left the hospital.   Pt accompanied by: self  PERTINENT HISTORY: [name] is a 78 y.o. female with history of hypercholesterolemia, herpes simplex, cataracts presenting 1/3 with acute onset of right-sided weakness. Patient called EMS after she had trouble getting out of her car due to her right leg weakness. CODE STROKE was activated by EMS.  Denies any hitroy of strokes, headaches, does not take blood thinners. CTH negative. Patient was unable to walk on assessment, even leaning to right while trying to simply sit up. Management with thrombolytic therapy was explained to the  patient, or patient's representative, as were risks, benefits and alternatives. All questions were answered. Patient, or patient's representative, expressed understanding of the treatment plan and agreed to proceed with thrombolytic treatment.  PAIN:  Are you having pain? No 0/10  PRECAUTIONS: Fall  RED FLAGS: None   WEIGHT BEARING RESTRICTIONS: No  FALLS: Has patient fallen in last 6 months? Yes. Number of falls 1 fell in the [facility] driveway Sept 5th  LIVING ENVIRONMENT: Lives with: lives alone Lives in: House/apartment Stairs:  has both an Engineer, structural or can take the stairs, normally she used to take the stairs  Has following equipment at home: Dan Humphreys - 4 wheeled  PLOF: Independent and Independent with basic ADLs  PATIENT GOALS: to be able to drive, go back to work, and go up and down steps   OBJECTIVE:  Note: Objective measures were completed at Evaluation unless otherwise noted.  DIAGNOSTIC FINDINGS:  FINDINGS: Brain: There is a 1.5 cm acute infarct involving the lateral aspect of the left thalamus and adjacent posterior limb of the internal capsule. A single chronic microhemorrhage is noted in the right frontal white matter. T2 hyperintensities in the cerebral white matter bilaterally are nonspecific but compatible with mild-to-moderate chronic small vessel ischemic disease. There is mild generalized cerebral atrophy.   Vascular: Major intracranial  vascular flow voids are preserved.   Skull and upper cervical spine: Unremarkable bone marrow signal.   Sinuses/Orbits: Bilateral cataract extraction. Paranasal sinuses and mastoid air cells are clear.   Other: None.   IMPRESSION: 1. Acute left thalamocapsular infarct. 2. 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 disease  COGNITION: Overall cognitive status: Within functional limits for tasks assessed   SENSATION: WFL  COORDINATION: Decreased coordination   POSTURE: rounded shoulders and forward head  LOWER EXTREMITY ROM:  all WFL    LOWER EXTREMITY MMT:  R hip flexion 3+/5 R knee extension 4-/5    TRANSFERS: Assistive device utilized: Environmental consultant - 4 wheeled  Sit to stand: CGA Stand to sit: SBA  STAIRS: Level of Assistance: CGA Stair Negotiation Technique: Step to Pattern with Single Rail on Right  GAIT: Gait pattern: step to pattern, decreased stance time- Right, decreased stride length, decreased hip/knee flexion- Right, ataxic, wide BOS, and poor foot clearance- Right Distance walked: in clinic distances Assistive device utilized: Environmental consultant - 4 wheeled Level of assistance: SBA   FUNCTIONAL TESTS:  5 times sit to stand: 19.81s Timed up and go (TUG): 16.25s Berg Balance Scale: 45/56                                                                                                                             TREATMENT DATE:  12/29/23 NuStep L5x71mins STS on airex 2x10 On airex feet together eyes open and closed  Side step on airex  On airex box taps 6" then step up  Tandem walking  SLS 15s in front of sink   12/24/23 Recheck TUG  Calf raises 2x10  Calf  stretch 30s on slant  NuStep L5 x70mins  Leg ext 5# 2x10 HS curls 20# 2x10 Lateral band walks Monster walks  Step ups 6"  Walking on beam   12/22/23 Bike L2 x67mins   Box taps 6" Shoulder ext 5# 2x10 AR press 5# 2x10 Resisted gait 10# 4 way x4  Leg press 20# 2x10  STS on airex 2x8 Horizontal abd red  2x10    [facility] Adult PT Treatment:                                                DATE: 12/19/23 Therapeutic Exercise: Seated LAQ 3# x15 B GTB around knees for eccentric STS  Therapeutic Activity: GTB around knees for side stepping - mod/max multimodal cues for forward gaze, erect trunk, hip/knee/ankle alignment, and control against resistance band Standing fingers tips on sink: balance and strength alt LE 20 x marching, SL hip flex/ext/abd, toe raises, heel raises and mini squats 10 at sink  Side step ups on 6 in step with BUE on rail, eccentric focus Forward step ups on 6 in step with BUE on rail, eccentric focus    Rest breaks provided t/o session to address fatigue   12/16/23  Bridges + ABD into green TB 2x12  Sidelying clams green TB 2x12 B STS green TB x12 cues to keep tension on band and control stand to sit adequately   Nustep L6x8 minutes all four extremities for promotion of reciprocal motion, conditioning    Tandem stance blue foam pad 3x30 seconds B Narrow BOS EC 3x30 seconds       12/02/23- EVAL    PATIENT EDUCATION: Education details: POC, HEP, education about fall risk Person educated: Patient Education method: Explanation Education comprehension: verbalized understanding  HOME EXERCISE PROGRAM: Access Code: [email or web address] URL: [email or web address] Date: 12/02/2023 Prepared by: Cassie Freer  Exercises - Standing Tandem Balance with Counter Support  - 1 x daily - 7 x weekly - 2 sets - 10 reps - 15 hold - Standing Single Leg Stance with Counter Support  - 1 x daily - 7 x weekly - 2 sets - 10 reps - 10 hold - Standing Hip Abduction with Counter Support  - 1 x daily - 7 x weekly - 2 sets - 10 reps - Seated March with Resistance  - 1 x daily - 7 x weekly - 2 sets - 10 reps - Seated Hip Abduction with Resistance  - 1 x daily - 7 x weekly - 2 sets - 10 reps  GOALS: Goals reviewed with patient? Yes  SHORT TERM GOALS: Target date: 01/13/24  Patient  will be independent with initial HEP. Baseline: given 12/02/23 Goal status: MET 12/24/23  2.  Patient will demonstrate decreased fall risk by scoring < 14 sec on TUG. Baseline: 16.25s without walker Goal status: MET 10.67s    LONG TERM GOALS: Target date: 02/24/24  Patient will be independent with advanced/ongoing HEP to improve outcomes and carryover.  Baseline:  Goal status: INITIAL  2.  Patient will be able to ambulate 500' with normalized gait pattern and good safety to access community.  Baseline: using rollator and some short distances without, decreased hip flexion on R LE Goal status: INITIAL  3.  Patient will be able to step up/down a flight of steps to safely to access apartment  Baseline: was using stairs in apt  prior to stroke Goal status: INITIAL   4.  Patient will demonstrate improved functional LE strength as demonstrated by 5xSTS <14s from chair height. Baseline: 19.81s Goal status: INITIAL  5.  Patient will score 53 on Berg Balance test to demonstrate lower risk of falls. (MCID= 8 points) Baseline: 45 Goal status: INITIAL  ASSESSMENT:  CLINICAL IMPRESSION: Patient is doing well, today we focused more on balance interventions. She has increased sway with eyes closed on airex pad. With side steps on to airex, she feels that she has to over do with the RLE to clear her foot and prevent tripping. minA needed with all interventions on airex. Min-modA needed with tandem walking, she was able to take maybe 3-4 consecutive steps before losing balance. With SLS she is only able to hold about 3-5s but when we moved to do it in front of the sink she was able to hold 15s without UE use. Reports she thinks it is a fear of falling.    As session progressed, pt demonstrated RLE fatigue with decreased knee extension during initial contact of gait cycle, as well as decreased weight bearing RLE. She required multimodal cuing for proper form during eccentric exercises and side  stepping, mainly due to weakness in R lateral hip stabilizers. Pt reports feeling exercises in her back and quads, but not her hips. She will continue to benefit from further skilled PT to progress hip/core stability, LE strength and balance to return to PLOF.     EVAL: Patient is a 78 y.o. female who was seen today for physical therapy evaluation and treatment for CVA on 10/24/23. Currently she is ambulating with a rollator and doing some walking without an AD safely around her apartment. She walks with decreased hip flexion and foot clearance on her RLE during swing phase of gait. With functional tests done today she presents as moderate fall risk. Patient will benefit from skilled PT to address her balance, gait, and strength deficits to return to PLOF, be able to drive, and go back to work at [employer].    OBJECTIVE IMPAIRMENTS: Abnormal gait, decreased balance, decreased coordination, decreased endurance, difficulty walking, and decreased strength.   ACTIVITY LIMITATIONS: stairs, transfers, and locomotion level  PARTICIPATION LIMITATIONS: driving, shopping, community activity, and occupation  PERSONAL FACTORS: Transportation are also affecting patient's functional outcome.   REHAB POTENTIAL: Good  CLINICAL DECISION MAKING: Stable/uncomplicated  EVALUATION COMPLEXITY: Low  PLAN:  PT FREQUENCY: 2x/week  PT DURATION: 12 weeks  PLANNED INTERVENTIONS: 97110-Therapeutic exercises, 97530- Therapeutic activity, 97112- Neuromuscular re-education, 97535- Self Care, 16109- Manual therapy, [phone]- Gait training, Patient/Family education, Balance training, and Stair training  PLAN FOR NEXT SESSION: start gym activities working on LE strength, hip/core stab, balance, and functional movements. Gait training, focusing on increasing R hip flexion. Work on general conditioning   Cassie Freer, PT, DPT 12/29/23 11:47 AM

## 2023-12-30 ENCOUNTER — Encounter: Payer: Self-pay | Admitting: Physical Medicine and Rehabilitation

## 2023-12-30 ENCOUNTER — Encounter
Payer: Medicare Other | Attending: Physical Medicine and Rehabilitation | Admitting: Physical Medicine and Rehabilitation

## 2023-12-30 VITALS — BP 124/73 | HR 69 | Ht 68.0 in | Wt 153.0 lb

## 2023-12-30 DIAGNOSIS — I639 Cerebral infarction, unspecified: Secondary | ICD-10-CM | POA: Diagnosis not present

## 2023-12-30 DIAGNOSIS — E785 Hyperlipidemia, unspecified: Secondary | ICD-10-CM | POA: Diagnosis present

## 2023-12-30 DIAGNOSIS — R29898 Other symptoms and signs involving the musculoskeletal system: Secondary | ICD-10-CM | POA: Diagnosis not present

## 2023-12-30 NOTE — Patient Instructions (Signed)
 Can start returning to local driving

## 2023-12-30 NOTE — Progress Notes (Addendum)
 Subjective:    Patient ID: Jordan Collier, female    DOB: Feb 05, 1946, 78 y.o.   MRN: [id number]  HPI:   1) CVA Jordan Collier is a 78 y.o. female who is scheduled for HFU appointment for F/U of her  T Jordan Collier was brought to [facility] via EMS  on 10/24/2023 for acute onset of Right Lower Extremity Weakness.  Dr. Derry Lory Note:  Jordan Collier is a 78 y.o. female with history of hypercholesterolemia, herpes simplex, cataract who presents with sudden onset right-sided weakness.  Patient reports that she was at work, she stepped outside to take a break and sat in her car at 10 AM this morning.  When she tried to get out of her car at 1015, she noted that her right leg was giving her significant trouble.  She thought initially that she sat on it and had a pinched nerve but when the symptoms persisted, she called EMS and was brought in as a code stroke with right-sided weakness.   She denies any prior history of strokes, no family history of strokes, denies any headaches.  Her symptoms were persistent and in fact she had some worsening of her right facial droop while she is within the ED.   She had CT head without contrast was negative for any acute intracranial normalities.  We tried to set patient up and try to get her to walk to assess how disabling her symptoms were.  It was noted the patient was having significant trouble with even sitting up and was leaning to her right and could not stand up or take a step.  Given the disabling nature of her symptoms, she was offered TNKase.  I discussed extensively with her the risks and benefits of TNKase and his mode of administration.  I discussed with patient about 30% chance of improving her symptoms about 3 to 5% risk of bleeding in her brain which could result in worsening of her symptoms or even death.   Patient understands risks and benefits of TNKase and consented to administration of TNKase.  CT Head: WO Contrast:   IMPRESSION: No hemorrhage or CT evidence of an acute cortical infarct. Aspects is 10. CTA:  IMPRESSION: 1. No intracranial large vessel occlusion. 2. No hemodynamically significant stenosis in the neck. 3. There is a 2.4 x 2.0 cm left thyroid nodule. Recommend further evaluation with a dedicated thyroid ultrasound.   MR: Brain: WO Contrast:  MPRESSION: 1. Acute left thalamocapsular infarct. 2. 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 disease.   Neurology recommended DAPT x 3 weeks followed by aspirin alone.   -she is working with Jordan Collier on PT -she is working with Jordan Collier as her OT -she breaks up her exercises -her goal is to be able to drive and go back to work -she works in Clinical biochemist at [employer]  2) Hip abductor weakness -Jordan Collier told her her hip needs to be strengthened -she does clamshell weakness  Pain Inventory Average Pain 0 Pain Right Now 0 My pain is  No pain  LOCATION OF PAIN  No pain  BOWEL Number of stools per week: 7  Oral laxative use  Hot lemon water, or prune juice Type of laxative None   BLADDER Normal and Pads Leakage with coughing Yes    Mobility walk without assistance how many minutes can you walk? 10-15 ability to climb steps?  yes do you drive?  no Do you have any goals in this area?  yes  Function employed # of hrs/week Currently on FMLA  I need assistance with the following:  [place] takes care of needed household duties. Do you have any goals in this area?  yes  Neuro/Psych bladder control problems weakness numbness trouble walking spasms  Prior Studies Any changes since last visit?  no  Physicians involved in your care Any changes since last visit?  yes Dr. Ton[facility]   Family History  Problem Relation Age of Onset   Hyperlipidemia Mother    Diabetes Mother    Cancer Father    Seizures Son    Social History   Socioeconomic History   Marital status:  Divorced    Spouse name: Not on file   Number of children: 2   Years of education: Not on file   Highest education level: Not on file  Occupational History   Occupation: Greeter    Employer: [id number]  Tobacco Use   Smoking status: Former    Current packs/day: 0.00    Types: Cigarettes    Quit date: 10/1999    Years since quitting: 24.2   Smokeless tobacco: Never  Vaping Use   Vaping status: Never Used  Substance and Sexual Activity   Alcohol use: Not Currently   Drug use: Not Currently   Sexual activity: Not Currently  Other Topics Concern   Not on file  Social History Narrative   Not on file   Social Drivers of Health   Financial Resource Strain: Not on file  Food Insecurity: Patient Declined (10/24/2023)   Hunger Vital Sign    Worried About Running Out of Food in the Last Year: Patient declined    Ran Out of Food in the Last Year: Patient declined  Transportation Needs: No Transportation Needs (10/24/2023)   PRAPARE - Administrator, Civil Service (Medical): No    Lack of Transportation (Non-Medical): No  Physical Activity: Not on file  Stress: Not on file  Social Connections: Unknown (10/24/2023)   Social Connection and Isolation Panel [NHANES]    Frequency of Communication with Friends and Family: More than three times a week    Frequency of Social Gatherings with Friends and Family: More than three times a week    Attends Religious Services: Patient declined    Database administrator or Organizations: No    Attends Engineer, structural: Never    Marital Status: Divorced   Past Surgical History:  Procedure Laterality Date   TRIGGER FINGER RELEASE Right 01/2023   TUBAL LIGATION N/A 1980   Past Medical History:  Diagnosis Date   Allergy to mold    Hypercholesterolemia    Psoriasis    Trigger finger, right middle finger    There were no vitals taken for this visit.  Opioid Risk Score:   Fall Risk Score:  `1  Depression screen Jordan Collier  2/9     11/24/2023    1:55 PM  Depression screen PHQ 2/9  Decreased Interest 0  Down, Depressed, Hopeless 0  PHQ - 2 Score 0  Altered sleeping 0  Tired, decreased energy 0  Change in appetite 0  Feeling bad or failure about yourself  0  Trouble concentrating 0  Moving slowly or fidgety/restless 0  Suicidal thoughts 0  PHQ-9 Score 0    Review of Systems  Musculoskeletal:  Positive for gait problem.       Spasms  Neurological:  Positive for weakness and numbness.  All other  systems reviewed and are negative.      Objective:   Physical Exam Vitals and nursing note reviewed.  Constitutional:      Appearance: Normal appearance.  Cardiovascular:     Rate and Rhythm: Normal rate and regular rhythm.     Pulses: Normal pulses.     Heart sounds: Normal heart sounds.  Pulmonary:     Effort: Pulmonary effort is normal.     Breath sounds: Normal breath sounds.  Musculoskeletal:     Comments: Normal Muscle Bulk and Muscle Testing Reveals:  Upper Extremities: Full ROM and Muscle Strength 4/5  Lower Extremities: Full ROM and Muscle Strength 5/5 Arises from Table slowly using walker for support Narrow Based  Gait     Skin:    General: Skin is warm and dry.  Neurological:     Mental Status: She is alert and oriented to person, place, and time.  Psychiatric:        Mood and Affect: Mood normal.        Behavior: Behavior normal.        Assessment & Plan:  1.Thalamic [place]: Referral Placed for Neuro Rehabilitation. She has a scheduled appointment with Neurology. Continue medication as prescribed. Continue to Monitor.   -continue PT and OT  -discussed her experience with therapy  -recommended avoiding processed foods, and eating fruits and vegetables   -continue B12, vitamin D  -discussed return to driving protocol  2. Hyperlipidemia: Continue current medication regimen. Continue to monitor. Discussed that Levqio may be a better option than statin  3) Hyperkalemia:   -discussed that she has a repeat blood draw -recommended to stop protein shakes  4) Fatigue: -recommended D3/K2   F/U with Dr Carlis Abbott in 4- 6 weeks

## 2023-12-31 ENCOUNTER — Encounter: Payer: Self-pay | Admitting: Occupational Therapy

## 2023-12-31 ENCOUNTER — Ambulatory Visit: Payer: Medicare Other

## 2023-12-31 ENCOUNTER — Ambulatory Visit: Payer: Medicare Other | Admitting: Occupational Therapy

## 2023-12-31 DIAGNOSIS — R2689 Other abnormalities of gait and mobility: Secondary | ICD-10-CM

## 2023-12-31 DIAGNOSIS — R41844 Frontal lobe and executive function deficit: Secondary | ICD-10-CM

## 2023-12-31 DIAGNOSIS — R278 Other lack of coordination: Secondary | ICD-10-CM

## 2023-12-31 DIAGNOSIS — R2681 Unsteadiness on feet: Secondary | ICD-10-CM

## 2023-12-31 DIAGNOSIS — I6381 Other cerebral infarction due to occlusion or stenosis of small artery: Secondary | ICD-10-CM

## 2023-12-31 DIAGNOSIS — M6281 Muscle weakness (generalized): Secondary | ICD-10-CM | POA: Diagnosis not present

## 2023-12-31 NOTE — Therapy (Signed)
 OUTPATIENT PHYSICAL THERAPY NEURO TREATMENT   Patient Name: Jordan Collier MRN: [id number] DOB:1946-08-20, 78 y.o., female Today's Date: 12/31/2023   PCP: Blenda Mounts REFERRING PROVIDER: Jacalyn Lefevre  END OF SESSION:  PT End of Session - 12/31/23 1049     Visit Number 7    Date for PT Re-Evaluation 02/24/24    Authorization Type BCBS    PT Start Time 1100    PT Stop Time 1145    PT Time Calculation (min) 45 min    Activity Tolerance Patient tolerated treatment well    Behavior During Therapy WFL for tasks assessed/performed                   Past Medical History:  Diagnosis Date   Allergy to mold    Hypercholesterolemia    Psoriasis    Trigger finger, right middle finger    Past Surgical History:  Procedure Laterality Date   TRIGGER FINGER RELEASE Right 01/2023   TUBAL LIGATION N/A 1980   Patient Active Problem List   Diagnosis Date Noted   History of shingles 11/11/2023   Vitamin D insufficiency 11/04/2023   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   Hyperlipemia 10/27/2023   Stroke determined by clinical assessment (HCC) 10/24/2023    ONSET DATE: 10/24/23  REFERRING DIAG:  I63.81 (ICD-10-CM) - Thalamic stroke (HCC)    THERAPY DIAG:  Muscle weakness (generalized)  Other lack of coordination  Unsteadiness on feet  Other abnormalities of gait and mobility  Rationale for Evaluation and Treatment: Rehabilitation  SUBJECTIVE:                                                                                                                                                                                             SUBJECTIVE STATEMENT: I am feeling good. Nothing new. Sore after last time but it was good.    EVAL: I am okay, a little pooped. My dexterity is off, I don't feel like I have the balance like I used to when I left the hospital.   Pt accompanied by: self  PERTINENT HISTORY: Jordan Collier is a 78 y.o. female with history of  hypercholesterolemia, herpes simplex, cataracts presenting 1/3 with acute onset of right-sided weakness. Patient called EMS after she had trouble getting out of her car due to her right leg weakness. CODE STROKE was activated by EMS.  Denies any hitroy of strokes, headaches, does not take blood thinners. CTH negative. Patient was unable to walk on assessment, even leaning to right while trying to simply sit up. Management with thrombolytic therapy was explained to the patient, or patient's representative, as  were risks, benefits and alternatives. All questions were answered. Patient, or patient's representative, expressed understanding of the treatment plan and agreed to proceed with thrombolytic treatment.  PAIN:  Are you having pain? No 0/10  PRECAUTIONS: Fall  RED FLAGS: None   WEIGHT BEARING RESTRICTIONS: No  FALLS: Has patient fallen in last 6 months? Yes. Number of falls 1 fell in the [facility] driveway Sept 5th  LIVING ENVIRONMENT: Lives with: lives alone Lives in: House/apartment Stairs:  has both an Engineer, structural or can take the stairs, normally she used to take the stairs  Has following equipment at home: Dan Humphreys - 4 wheeled  PLOF: Independent and Independent with basic ADLs  PATIENT GOALS: to be able to drive, go back to work, and go up and down steps   OBJECTIVE:  Note: Objective measures were completed at Evaluation unless otherwise noted.  DIAGNOSTIC FINDINGS:  FINDINGS: Brain: There is a 1.5 cm acute infarct involving the lateral aspect of the left thalamus and adjacent posterior limb of the internal capsule. A single chronic microhemorrhage is noted in the right frontal white matter. T2 hyperintensities in the cerebral white matter bilaterally are nonspecific but compatible with mild-to-moderate chronic small vessel ischemic disease. There is mild generalized cerebral atrophy.   Vascular: Major intracranial vascular flow voids are preserved.   Skull and upper  cervical spine: Unremarkable bone marrow signal.   Sinuses/Orbits: Bilateral cataract extraction. Paranasal sinuses and mastoid air cells are clear.   Other: None.   IMPRESSION: 1. Acute left thalamocapsular infarct. 2. 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 disease  COGNITION: Overall cognitive status: Within functional limits for tasks assessed   SENSATION: WFL  COORDINATION: Decreased coordination   POSTURE: rounded shoulders and forward head  LOWER EXTREMITY ROM:  all WFL    LOWER EXTREMITY MMT:  R hip flexion 3+/5 R knee extension 4-/5    TRANSFERS: Assistive device utilized: Environmental consultant - 4 wheeled  Sit to stand: CGA Stand to sit: SBA  STAIRS: Level of Assistance: CGA Stair Negotiation Technique: Step to Pattern with Single Rail on Right  GAIT: Gait pattern: step to pattern, decreased stance time- Right, decreased stride length, decreased hip/knee flexion- Right, ataxic, wide BOS, and poor foot clearance- Right Distance walked: in clinic distances Assistive device utilized: Environmental consultant - 4 wheeled Level of assistance: SBA   FUNCTIONAL TESTS:  5 times sit to stand: 19.81s Timed up and go (TUG): 16.25s Berg Balance Scale: 45/56                                                                                                                             TREATMENT DATE:  12/31/23 Walking outdoors around [employer] in the grass  Resisted gait 20# forwards, 10# side steps x4 Shoulder ext 5# 2x10 Seated row 15# 2x10 Bike L4 x53mins  On airex box tap 6" CGA Step up from airex on to 6" mod-minA  STS on airex with band around knees 2x8  12/29/23 NuStep L5x54mins STS on airex 2x10 On airex feet together eyes open and closed  Side step on airex  On airex box taps 6" then step up  Tandem walking  SLS 15s in front of sink   12/24/23 Recheck TUG  Calf raises 2x10  Calf stretch 30s on slant  NuStep L5 x94mins  Leg ext 5# 2x10 HS curls 20# 2x10 Lateral  band walks Monster walks  Step ups 6"  Walking on beam   12/22/23 Bike L2 x43mins   Box taps 6" Shoulder ext 5# 2x10 AR press 5# 2x10 Resisted gait 10# 4 way x4  Leg press 20# 2x10  STS on airex 2x8 Horizontal abd red 2x10    [facility] Adult PT Treatment:                                                DATE: 12/19/23 Therapeutic Exercise: Seated LAQ 3# x15 B GTB around knees for eccentric STS  Therapeutic Activity: GTB around knees for side stepping - mod/max multimodal cues for forward gaze, erect trunk, hip/knee/ankle alignment, and control against resistance band Standing fingers tips on sink: balance and strength alt LE 20 x marching, SL hip flex/ext/abd, toe raises, heel raises and mini squats 10 at sink  Side step ups on 6 in step with BUE on rail, eccentric focus Forward step ups on 6 in step with BUE on rail, eccentric focus    Rest breaks provided t/o session to address fatigue   12/16/23  Bridges + ABD into green TB 2x12  Sidelying clams green TB 2x12 B STS green TB x12 cues to keep tension on band and control stand to sit adequately   Nustep L6x8 minutes all four extremities for promotion of reciprocal motion, conditioning    Tandem stance blue foam pad 3x30 seconds B Narrow BOS EC 3x30 seconds       12/02/23- EVAL    PATIENT EDUCATION: Education details: POC, HEP, education about fall risk Person educated: Patient Education method: Explanation Education comprehension: verbalized understanding  HOME EXERCISE PROGRAM: Access Code: [email or web address] URL: [email or web address] Date: 12/02/2023 Prepared by: Cassie Freer  Exercises - Standing Tandem Balance with Counter Support  - 1 x daily - 7 x weekly - 2 sets - 10 reps - 15 hold - Standing Single Leg Stance with Counter Support  - 1 x daily - 7 x weekly - 2 sets - 10 reps - 10 hold - Standing Hip Abduction with Counter Support  - 1 x daily - 7 x weekly - 2 sets - 10 reps - Seated March with  Resistance  - 1 x daily - 7 x weekly - 2 sets - 10 reps - Seated Hip Abduction with Resistance  - 1 x daily - 7 x weekly - 2 sets - 10 reps  GOALS: Goals reviewed with patient? Yes  SHORT TERM GOALS: Target date: 01/13/24  Patient will be independent with initial HEP. Baseline: given 12/02/23 Goal status: MET 12/24/23  2.  Patient will demonstrate decreased fall risk by scoring < 14 sec on TUG. Baseline: 16.25s without walker Goal status: MET 10.67s    LONG TERM GOALS: Target date: 02/24/24  Patient will be independent with advanced/ongoing HEP to improve outcomes and carryover.  Baseline:  Goal status: INITIAL  2.  Patient will be able to ambulate 500' with normalized gait pattern  and good safety to access community.  Baseline: using rollator and some short distances without, decreased hip flexion on R LE Goal status: IN PROGRESS 12/31/23 no AD, but still a bit unsteady, some veering and R foot dragging   3.  Patient will be able to step up/down a flight of steps to safely to access apartment  Baseline: was using stairs in apt prior to stroke Goal status: INITIAL   4.  Patient will demonstrate improved functional LE strength as demonstrated by 5xSTS <14s from chair height. Baseline: 19.81s Goal status: INITIAL  5.  Patient will score 53 on Berg Balance test to demonstrate lower risk of falls. (MCID= 8 points) Baseline: 45 Goal status: INITIAL  ASSESSMENT:  CLINICAL IMPRESSION: We walked outdoors today to work on some community reintegration. She does veer to the left a bit when walking on uneven surfaces/grass. Has some foot dragging on the R side, that is more noticeable with fatigue. Min-modA needed with step ups from airex on to 6", some LOB when stepping off backwards. If she goes slow she is able to have less balance deficits. Increased knee valgus stress with STS, used theraband around knees to provide tactile cue.    As session progressed, pt demonstrated RLE fatigue  with decreased knee extension during initial contact of gait cycle, as well as decreased weight bearing RLE. She required multimodal cuing for proper form during eccentric exercises and side stepping, mainly due to weakness in R lateral hip stabilizers. Pt reports feeling exercises in her back and quads, but not her hips. She will continue to benefit from further skilled PT to progress hip/core stability, LE strength and balance to return to PLOF.     EVAL: Patient is a 78 y.o. female who was seen today for physical therapy evaluation and treatment for CVA on 10/24/23. Currently she is ambulating with a rollator and doing some walking without an AD safely around her apartment. She walks with decreased hip flexion and foot clearance on her RLE during swing phase of gait. With functional tests done today she presents as moderate fall risk. Patient will benefit from skilled PT to address her balance, gait, and strength deficits to return to PLOF, be able to drive, and go back to work at [employer].    OBJECTIVE IMPAIRMENTS: Abnormal gait, decreased balance, decreased coordination, decreased endurance, difficulty walking, and decreased strength.   ACTIVITY LIMITATIONS: stairs, transfers, and locomotion level  PARTICIPATION LIMITATIONS: driving, shopping, community activity, and occupation  PERSONAL FACTORS: Transportation are also affecting patient's functional outcome.   REHAB POTENTIAL: Good  CLINICAL DECISION MAKING: Stable/uncomplicated  EVALUATION COMPLEXITY: Low  PLAN:  PT FREQUENCY: 2x/week  PT DURATION: 12 weeks  PLANNED INTERVENTIONS: 97110-Therapeutic exercises, 97530- Therapeutic activity, 97112- Neuromuscular re-education, 97535- Self Care, 78469- Manual therapy, [phone]- Gait training, Patient/Family education, Balance training, and Stair training  PLAN FOR NEXT SESSION: start gym activities working on LE strength, hip/core stab, balance, and functional movements. Gait  training, focusing on increasing R hip flexion. Work on general conditioning   Cassie Freer, PT, DPT 12/31/23 11:42 AM

## 2023-12-31 NOTE — Therapy (Signed)
 OUTPATIENT OCCUPATIONAL THERAPY NEURO EVALUATION  Patient Name: Jordan Collier MRN: [id number] DOB:February 08, 1946, 78 y.o., female Today's Date: 12/31/2023  PCP: Dr. Harrison Mons REFERRING PROVIDER: Jacalyn Lefevre, NP/ Dr. Carlis Abbott  END OF SESSION:  OT End of Session - 12/31/23 0947     Visit Number 5    Number of Visits 25    Authorization Type BCBS MCR    Authorization - Visit Number 5    Progress Note Due on Visit 10    OT Start Time 1015    OT Stop Time 1058    OT Time Calculation (min) 43 min               Past Medical History:  Diagnosis Date   Allergy to mold    Hypercholesterolemia    Psoriasis    Trigger finger, right middle finger    Past Surgical History:  Procedure Laterality Date   TRIGGER FINGER RELEASE Right 01/2023   TUBAL LIGATION N/A 1980   Patient Active Problem List   Diagnosis Date Noted   History of shingles 11/11/2023   Vitamin D insufficiency 11/04/2023   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   Hyperlipemia 10/27/2023   Stroke determined by clinical assessment (HCC) 10/24/2023    ONSET DATE: 10/24/23  REFERRING DIAG:  Diagnosis  I63.81 (ICD-10-CM) - Thalamic stroke (HCC)    THERAPY DIAG:  Muscle weakness (generalized)  Other lack of coordination  Frontal lobe and executive function deficit  Thalamic stroke (HCC)  Other abnormalities of gait and mobility  Unsteadiness on feet  Rationale for Evaluation and Treatment: Rehabilitation  SUBJECTIVE:   SUBJECTIVE STATEMENT: Pt reports she is splitting up the exercises and it is going better  Pt accompanied by: self  PERTINENT HISTORY: Pt is a 78 y/o female presenting 1/3 with sudden onset of R sided weakness.  TNK administered.  MRI (+) Acute left thalamocapsular infarct. PMH; hyperlipidemia       PRECAUTIONS: Fall  WEIGHT BEARING RESTRICTIONS: No  PAIN:  no   FALLS: Has patient fallen in last 6 months? No  LIVING ENVIRONMENT: Lives with: lives alone Lives in:  House/apartment Stairs: No, has elevator Has following equipment at home:  rollator  PLOF: Independent  PATIENT GOALS: improve function in RUE  OBJECTIVE:  Note: Objective measures were completed at Evaluation unless otherwise noted.  HAND DOMINANCE: LUE  ADLs: Overall ADLs: modified Independent Transfers/ambulation related to ADLs:mod I with rollator  Tub Shower transfers: walk in shower with seat Equipment:  rollator  IADLs:  Meal Prep: has cooked 1 meal however she is able to go dining room/ restaurant at [employer]   MOBILITY STATUS:  mod I with rollator    ACTIVITY TOLERANCE: Activity tolerance: fatigues more quickly   FUNCTIONAL OUTCOME MEASURES: Quick Dash: 36% disability  UPPER EXTREMITY ROM:  WFLS for LUE, grossly WFLs for RUE with exception of internal rotation/ difficulty reaching behind back   Active ROM Right eval Left eval  Shoulder flexion    Shoulder abduction    Shoulder adduction    Shoulder extension    Shoulder internal rotation  grossly 75%  Shoulder external rotation    Elbow flexion    Elbow extension    Wrist flexion    Wrist extension    Wrist ulnar deviation    Wrist radial deviation    Wrist pronation    Wrist supination    (Blank rows = not tested)  UPPER EXTREMITY MMT:     MMT Right eval Left eval  Shoulder  flexion 3+/5 4+/5  Shoulder abduction    Shoulder adduction    Shoulder extension    Shoulder internal rotation    Shoulder external rotation    Middle trapezius    Lower trapezius    Elbow flexion 4/5 4+/5  Elbow extension 4/5 4+/5  Wrist flexion    Wrist extension    Wrist ulnar deviation    Wrist radial deviation    Wrist pronation    Wrist supination    (Blank rows = not tested)  HAND FUNCTION: Grip strength: Right: 30 lbs; Left: 45 lbs  COORDINATION: 9 Hole Peg test: Right: 35.17 sec; Left: 28.32 sec, Box/ blocks: RUE: 45 blocks, LUE 55 blocks  SENSATION: WFL    COGNITION: Overall  cognitive status:  delayed processing per pt report. Pt spells"WORLD" backwards correctly, pt was able to correctly perfrom clock drawing task   VISION ASSESSMENT: Not tested- denies visual changes from CVA      OBSERVATIONS: 78 y.o female is highly motivated to improve so she can return to work                                                                                                                             TREATMENT DATE: 12/31/23 UBE x 6 mins for conditioning Upgraded HEP to red band 15 reps each, for shoulder abduction, biceps curls, triceps ext, shoulder flexion and shoulder extension, min v.c and demonstration to initate, then pt returned demonstration Copying small peg design on vertical surface for functional reaching just at eye level and fine motor coordination with a cognitive component, min-mod drops, Pt copied design with several errors, pt corrected with min v.c  12/24/23- UBE x 6 mins level 3 for conditioning. Therapist upgraded putty to red and reveiwed HEP. Placing and removing grooved pegs from pegboard withh RUE for improved fine motor coordiantion, min v.c  Today her grip is 40 lbs for left and right UE's 3# Weighted cane for shoulder flexion and biceps curls 15 reps each min v.c  Pt was provided with an exercise flow sheet and therapist made recommendations regarding frequency so that pt is not overwhelmed. Pt verbalized understanding.  12/22/23-Reviewed yellow theraband HEP, 10-15 reps each for bilateral UE's mod v.c and demonstration. Ambulating while tossing ball between hands, then performing category generation for naming food for each letter of the alphabet, min-mod v.c for generating foods, min v.c for balance  discussion with pt about how divided/ alternating attention would impact driving and therapist recommends additional work in this area prior to driving as pt demonstrated instability at times and required increased v.c for category generation Copying  small peg design with RUE for coordination with a visual component, pt was able to hold several pegs in her hand to manipulate while placing in the peg board, min difficulty.   12/19/23:  Pt instructed in coordination HEP and yellow theraband HEP--see pt education section.  12/02/23- eval completed , see education  PATIENT EDUCATION: Education details:exercise flow sheet Person educated: Patient Education method: Explanation, Demonstration, Verbal cues, handout Education comprehension: verbalized understanding, returned demonstration  HOME EXERCISE PROGRAM: Previously was issued putty HEP 12/18/22:  Coordination HEP, Yellow theraband HEP   GOALS: Goals reviewed with patient? Yes  SHORT TERM GOALS: Target date: 12/29/23  I with inital HEP  Goal status: met, 12/24/23  2.  Pt will demonstrate improved RUE functional use for ADLs as evidenced by increasing box/ blocks score by 4 blocks.  Goal status: ongoing, 12/22/23  3.   Pt. will demonstrate ability to perfrom a physical and cognitive task simultaneously  with 90% or better accuracy in prep for return to work and driving.  Goal status: onogin   4.  Pt will increase RUE grip strength by 5 lbs for increased RUE functional use.  Goal status: met, 12/24/23  5.  Pt will report increased ease with separating paper/ pages with RUE.  Goal status: ongoing  6.  Pt will bathe her back modified indpendently with RUE.  Goal status: met, per pt report, pt demonstrates ability to perform 12/24/23  LONG TERM GOALS: Target date: 02/24/24  I with updated HEP.  Goal status: INITIAL  2.  Pt will demonstrate improved fine motor coordination for ADLs as evidenced by decreasing RUE 9 hole peg test score by 3 secs.  Goal status: INITIAL  3.  Pt will perform simulated work activities modified independently.  Goal status: INITIAL  4.  Pt will report increased ease and accuracy with typing.  Goal status: INITIAL  5.  Pt will  demonstrate ability to retrieve 3 lbs from overhead shelf with good control. Baseline:  Goal status: INITIAL   ASSESSMENT:  CLINICAL IMPRESSION: Pt is progressing towards goals.she demonstrates improved strength and coordiantion.  PERFORMANCE DEFICITS: in functional skills including ADLs, IADLs, coordination, dexterity, ROM, strength, pain, flexibility, Fine motor control, Gross motor control, mobility, balance, endurance, decreased knowledge of precautions, decreased knowledge of use of DME, and UE functional use, cognitive skills including attention, safety awareness, and thought, and psychosocial skills including coping strategies, environmental adaptation, habits, interpersonal interactions, and routines and behaviors.   IMPAIRMENTS: are limiting patient from ADLs, IADLs, work, play, leisure, and social participation.   CO-MORBIDITIES: may have co-morbidities  that affects occupational performance. Patient will benefit from skilled OT to address above impairments and improve overall function.  MODIFICATION OR ASSISTANCE TO COMPLETE EVALUATION: No modification of tasks or assist necessary to complete an evaluation.  OT OCCUPATIONAL PROFILE AND HISTORY: Detailed assessment: Review of records and additional review of physical, cognitive, psychosocial history related to current functional performance.  CLINICAL DECISION MAKING: LOW - limited treatment options, no task modification necessary  REHAB POTENTIAL: Good  EVALUATION COMPLEXITY: Low    PLAN:  OT FREQUENCY: 2x/week  OT DURATION: 12 weeks, will likely d/c after 6-8 weeks dependent on progress.  PLANNED INTERVENTIONS: 97168 OT Re-evaluation, 97535 self care/ADL training, 16109 therapeutic exercise, 97530 therapeutic activity, 97112 neuromuscular re-education, 97140 manual therapy, 97116 gait training, 60454 aquatic therapy, 97035 ultrasound, 97018 paraffin, 09811 moist heat, 97010 cryotherapy, 97129 Cognitive training (first 15  min), 91478 Cognitive training(each additional 15 min), passive range of motion, balance training, stair training, functional mobility training, psychosocial skills training, energy conservation, coping strategies training, patient/family education, and DME and/or AE instructions  RECOMMENDED OTHER SERVICES: PT  CONSULTED AND AGREED WITH PLAN OF CARE: Patient  PLAN FOR NEXT SESSION:  continue to address strength and endurance   pt is highly motivated to  improve and to return to work at [employer], OTR/L 12/31/2023, 10:26 AM

## 2024-01-02 ENCOUNTER — Other Ambulatory Visit (HOSPITAL_COMMUNITY): Payer: Self-pay

## 2024-01-02 ENCOUNTER — Other Ambulatory Visit (HOSPITAL_BASED_OUTPATIENT_CLINIC_OR_DEPARTMENT_OTHER): Payer: Self-pay

## 2024-01-02 ENCOUNTER — Other Ambulatory Visit: Payer: Self-pay

## 2024-01-02 MED ORDER — PRAVASTATIN SODIUM 20 MG PO TABS
20.0000 mg | ORAL_TABLET | Freq: Every day | ORAL | 1 refills | Status: AC
  Filled 2024-01-02 (×2): qty 90, 90d supply, fill #0

## 2024-01-05 ENCOUNTER — Ambulatory Visit: Payer: Medicare Other | Admitting: Physical Therapy

## 2024-01-05 ENCOUNTER — Ambulatory Visit: Payer: Medicare Other | Admitting: Occupational Therapy

## 2024-01-05 ENCOUNTER — Encounter: Payer: Self-pay | Admitting: Physical Therapy

## 2024-01-05 DIAGNOSIS — M6281 Muscle weakness (generalized): Secondary | ICD-10-CM | POA: Diagnosis not present

## 2024-01-05 DIAGNOSIS — R2681 Unsteadiness on feet: Secondary | ICD-10-CM

## 2024-01-05 DIAGNOSIS — R278 Other lack of coordination: Secondary | ICD-10-CM

## 2024-01-05 NOTE — Therapy (Signed)
 OUTPATIENT PHYSICAL THERAPY NEURO TREATMENT   Patient Name: Shondra Capps MRN: [id number] DOB:Dec 02, 1945, 78 y.o., female Today's Date: 01/05/2024   PCP: Blenda Mounts REFERRING PROVIDER: Jacalyn Lefevre  END OF SESSION:  PT End of Session - 01/05/24 1320     Visit Number 8    Date for PT Re-Evaluation 02/24/24    Authorization Type BCBS    PT Start Time 1313    PT Stop Time 1402    PT Time Calculation (min) 49 min    Activity Tolerance Patient tolerated treatment well    Behavior During Therapy WFL for tasks assessed/performed                    Past Medical History:  Diagnosis Date   Allergy to mold    Hypercholesterolemia    Psoriasis    Trigger finger, right middle finger    Past Surgical History:  Procedure Laterality Date   TRIGGER FINGER RELEASE Right 01/2023   TUBAL LIGATION N/A 1980   Patient Active Problem List   Diagnosis Date Noted   History of shingles 11/11/2023   Vitamin D insufficiency 11/04/2023   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   Hyperlipemia 10/27/2023   Stroke determined by clinical assessment (HCC) 10/24/2023    ONSET DATE: 10/24/23  REFERRING DIAG:  I63.81 (ICD-10-CM) - Thalamic stroke (HCC)    THERAPY DIAG:  Muscle weakness (generalized)  Unsteadiness on feet  Other lack of coordination  Rationale for Evaluation and Treatment: Rehabilitation  SUBJECTIVE:                                                                                                                                                                                             SUBJECTIVE STATEMENT:  I had two events since last time, the ball I was using for OT was under the couch and I had to crawl around on the floor to get it and knee was sore. I was having a lot of trouble getting through HEP from OT and PT, started eating a better breakfast and exercise is now easier, this tells me I need to watch my diet. Balance is hard for me, the outside stuff  Raynelle Fanning did last time was good. Would rate myself 75% out of 100 with balance being most important to me.      EVAL: I am okay, a little pooped. My dexterity is off, I don't feel like I have the balance like I used to when I left the hospital.   Pt accompanied by: self  PERTINENT HISTORY: Ms. Margarett Viti is a 78 y.o. female with history of hypercholesterolemia, herpes  simplex, cataracts presenting 1/3 with acute onset of right-sided weakness. Patient called EMS after she had trouble getting out of her car due to her right leg weakness. CODE STROKE was activated by EMS.  Denies any hitroy of strokes, headaches, does not take blood thinners. CTH negative. Patient was unable to walk on assessment, even leaning to right while trying to simply sit up. Management with thrombolytic therapy was explained to the patient, or patient's representative, as were risks, benefits and alternatives. All questions were answered. Patient, or patient's representative, expressed understanding of the treatment plan and agreed to proceed with thrombolytic treatment.  PAIN:  Are you having pain? No 0/10 now, right knee is a little sore from crawling but nothing major/not registering on NPRS   PRECAUTIONS: Fall  RED FLAGS: None   WEIGHT BEARING RESTRICTIONS: No  FALLS: Has patient fallen in last 6 months? Yes. Number of falls 1 fell in the [facility] driveway Sept 5th  LIVING ENVIRONMENT: Lives with: lives alone Lives in: House/apartment Stairs:  has both an Engineer, structural or can take the stairs, normally she used to take the stairs  Has following equipment at home: Dan Humphreys - 4 wheeled  PLOF: Independent and Independent with basic ADLs  PATIENT GOALS: to be able to drive, go back to work, and go up and down steps   OBJECTIVE:  Note: Objective measures were completed at Evaluation unless otherwise noted.  DIAGNOSTIC FINDINGS:  FINDINGS: Brain: There is a 1.5 cm acute infarct involving the lateral aspect  of the left thalamus and adjacent posterior limb of the internal capsule. A single chronic microhemorrhage is noted in the right frontal white matter. T2 hyperintensities in the cerebral white matter bilaterally are nonspecific but compatible with mild-to-moderate chronic small vessel ischemic disease. There is mild generalized cerebral atrophy.   Vascular: Major intracranial vascular flow voids are preserved.   Skull and upper cervical spine: Unremarkable bone marrow signal.   Sinuses/Orbits: Bilateral cataract extraction. Paranasal sinuses and mastoid air cells are clear.   Other: None.   IMPRESSION: 1. Acute left thalamocapsular infarct. 2. 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 disease  COGNITION: Overall cognitive status: Within functional limits for tasks assessed   SENSATION: WFL  COORDINATION: Decreased coordination   POSTURE: rounded shoulders and forward head  LOWER EXTREMITY ROM:  all WFL    LOWER EXTREMITY MMT:  R hip flexion 3+/5 R knee extension 4-/5    TRANSFERS: Assistive device utilized: Environmental consultant - 4 wheeled  Sit to stand: CGA Stand to sit: SBA  STAIRS: Level of Assistance: CGA Stair Negotiation Technique: Step to Pattern with Single Rail on Right  GAIT: Gait pattern: step to pattern, decreased stance time- Right, decreased stride length, decreased hip/knee flexion- Right, ataxic, wide BOS, and poor foot clearance- Right Distance walked: in clinic distances Assistive device utilized: Walker - 4 wheeled Level of assistance: SBA   FUNCTIONAL TESTS:  5 times sit to stand: 19.81s Timed up and go (TUG): 16.25s Berg Balance Scale: 45/56  TREATMENT DATE:   01/05/24  Nustep L5x8 minutes all four extremities for reciprocal motion/functional activity tolerance   Gait (for balance challenge) outside up and down hills  (struggled a little more on declines), over grass/gravel/pine needles min guard to S  Lateral wt shifts on BAPs board x2 min min guard One LE on BOSU/other on blue foam 3x30 seconds B Alternating toe taps to top of BOSU from blue foam pad x20  Lateral toe taps to BOSU while standing on blue foam pad x15 B Cross midline toe taps on 6 inch steps x10 B    12/31/23 Walking outdoors around [employer] in the grass  Resisted gait 20# forwards, 10# side steps x4 Shoulder ext 5# 2x10 Seated row 15# 2x10 Bike L4 x64mins  On airex box tap 6" CGA Step up from airex on to 6" mod-minA  STS on airex with band around knees 2x8  12/29/23 NuStep L5x42mins STS on airex 2x10 On airex feet together eyes open and closed  Side step on airex  On airex box taps 6" then step up  Tandem walking  SLS 15s in front of sink   12/24/23 Recheck TUG  Calf raises 2x10  Calf stretch 30s on slant  NuStep L5 x85mins  Leg ext 5# 2x10 HS curls 20# 2x10 Lateral band walks Monster walks  Step ups 6"  Walking on beam      PATIENT EDUCATION: Education details: POC, HEP, education about fall risk Person educated: Patient Education method: Explanation Education comprehension: verbalized understanding  HOME EXERCISE PROGRAM: Access Code: [email or web address] URL: [email or web address] Date: 12/02/2023 Prepared by: Cassie Freer  Exercises - Standing Tandem Balance with Counter Support  - 1 x daily - 7 x weekly - 2 sets - 10 reps - 15 hold - Standing Single Leg Stance with Counter Support  - 1 x daily - 7 x weekly - 2 sets - 10 reps - 10 hold - Standing Hip Abduction with Counter Support  - 1 x daily - 7 x weekly - 2 sets - 10 reps - Seated March with Resistance  - 1 x daily - 7 x weekly - 2 sets - 10 reps - Seated Hip Abduction with Resistance  - 1 x daily - 7 x weekly - 2 sets - 10 reps  GOALS: Goals reviewed with patient? Yes  SHORT TERM GOALS: Target date: 01/13/24  Patient will be  independent with initial HEP. Baseline: given 12/02/23 Goal status: MET 12/24/23  2.  Patient will demonstrate decreased fall risk by scoring < 14 sec on TUG. Baseline: 16.25s without walker Goal status: MET 10.67s    LONG TERM GOALS: Target date: 02/24/24  Patient will be independent with advanced/ongoing HEP to improve outcomes and carryover.  Baseline:  Goal status: INITIAL  2.  Patient will be able to ambulate 500' with normalized gait pattern and good safety to access community.  Baseline: using rollator and some short distances without, decreased hip flexion on R LE Goal status: IN PROGRESS 12/31/23 no AD, but still a bit unsteady, some veering and R foot dragging   3.  Patient will be able to step up/down a flight of steps to safely to access apartment  Baseline: was using stairs in apt prior to stroke Goal status: INITIAL   4.  Patient will demonstrate improved functional LE strength as demonstrated by 5xSTS <14s from chair height. Baseline: 19.81s Goal status: INITIAL  5.  Patient will score 53 on Berg Balance test to demonstrate lower  risk of falls. (MCID= 8 points) Baseline: 45 Goal status: INITIAL  ASSESSMENT:  CLINICAL IMPRESSION:    Pt arrives today doing well, her main concerns continues to center around balance. We warmed up on the nustep today then focused on balance training indoors and outdoors per her request. Doing well, she has progressed a lot since the last time this therapist saw her. Gave her blue TB as green was feeling pretty easy for her in HEP.      EVAL: Patient is a 78 y.o. female who was seen today for physical therapy evaluation and treatment for CVA on 10/24/23. Currently she is ambulating with a rollator and doing some walking without an AD safely around her apartment. She walks with decreased hip flexion and foot clearance on her RLE during swing phase of gait. With functional tests done today she presents as moderate fall risk. Patient will  benefit from skilled PT to address her balance, gait, and strength deficits to return to PLOF, be able to drive, and go back to work at [employer].    OBJECTIVE IMPAIRMENTS: Abnormal gait, decreased balance, decreased coordination, decreased endurance, difficulty walking, and decreased strength.   ACTIVITY LIMITATIONS: stairs, transfers, and locomotion level  PARTICIPATION LIMITATIONS: driving, shopping, community activity, and occupation  PERSONAL FACTORS: Transportation are also affecting patient's functional outcome.   REHAB POTENTIAL: Good  CLINICAL DECISION MAKING: Stable/uncomplicated  EVALUATION COMPLEXITY: Low  PLAN:  PT FREQUENCY: 2x/week  PT DURATION: 12 weeks  PLANNED INTERVENTIONS: 97110-Therapeutic exercises, 97530- Therapeutic activity, 97112- Neuromuscular re-education, 97535- Self Care, 27253- Manual therapy, [phone]- Gait training, Patient/Family education, Balance training, and Stair training  PLAN FOR NEXT SESSION: start gym activities working on LE strength, hip/core stab, balance, and functional movements. Gait training, focusing on increasing R hip flexion. Work on general conditioning. How is HEP going?   Nedra Hai, PT, DPT 01/05/24 2:03 PM

## 2024-01-06 NOTE — Therapy (Signed)
 OUTPATIENT PHYSICAL THERAPY NEURO TREATMENT   Patient Name: Jordan Collier MRN: [id number] DOB:1946-04-01, 78 y.o., female Today's Date: 01/07/2024   PCP: Blenda Mounts REFERRING PROVIDER: Jacalyn Lefevre  END OF SESSION:  PT End of Session - 01/07/24 1358     Visit Number 9    Date for PT Re-Evaluation 02/24/24    Authorization Type BCBS    PT Start Time 1400    PT Stop Time 1445    PT Time Calculation (min) 45 min    Activity Tolerance Patient tolerated treatment well    Behavior During Therapy WFL for tasks assessed/performed                     Past Medical History:  Diagnosis Date   Allergy to mold    Hypercholesterolemia    Psoriasis    Trigger finger, right middle finger    Past Surgical History:  Procedure Laterality Date   TRIGGER FINGER RELEASE Right 01/2023   TUBAL LIGATION N/A 1980   Patient Active Problem List   Diagnosis Date Noted   History of shingles 11/11/2023   Vitamin D insufficiency 11/04/2023   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   Hyperlipemia 10/27/2023   Stroke determined by clinical assessment (HCC) 10/24/2023    ONSET DATE: 10/24/23  REFERRING DIAG:  I63.81 (ICD-10-CM) - Thalamic stroke (HCC)    THERAPY DIAG:  Muscle weakness (generalized)  Thalamic stroke (HCC)  Unsteadiness on feet  Other abnormalities of gait and mobility  Other lack of coordination  Rationale for Evaluation and Treatment: Rehabilitation  SUBJECTIVE:                                                                                                                                                                                             SUBJECTIVE STATEMENT:  I was sore after last PT session. I felt good and was invigorated by all the balance stuff.    EVAL: I am okay, a little pooped. My dexterity is off, I don't feel like I have the balance like I used to when I left the hospital.   Pt accompanied by: self  PERTINENT HISTORY: [name] is a 78 y.o. female with history of hypercholesterolemia, herpes simplex, cataracts presenting 1/3 with acute onset of right-sided weakness. Patient called EMS after she had trouble getting out of her car due to her right leg weakness. CODE STROKE was activated by EMS.  Denies any hitroy of strokes, headaches, does not take blood thinners. CTH negative. Patient was unable to walk on assessment, even leaning to right while trying to simply sit up. Management with  thrombolytic therapy was explained to the patient, or patient's representative, as were risks, benefits and alternatives. All questions were answered. Patient, or patient's representative, expressed understanding of the treatment plan and agreed to proceed with thrombolytic treatment.  PAIN:  Are you having pain? No 0/10 now, right knee is a little sore from crawling but nothing major/not registering on NPRS   PRECAUTIONS: Fall  RED FLAGS: None   WEIGHT BEARING RESTRICTIONS: No  FALLS: Has patient fallen in last 6 months? Yes. Number of falls 1 fell in the [facility] driveway Sept 5th  LIVING ENVIRONMENT: Lives with: lives alone Lives in: House/apartment Stairs:  has both an Engineer, structural or can take the stairs, normally she used to take the stairs  Has following equipment at home: Dan Humphreys - 4 wheeled  PLOF: Independent and Independent with basic ADLs  PATIENT GOALS: to be able to drive, go back to work, and go up and down steps   OBJECTIVE:  Note: Objective measures were completed at Evaluation unless otherwise noted.  DIAGNOSTIC FINDINGS:  FINDINGS: Brain: There is a 1.5 cm acute infarct involving the lateral aspect of the left thalamus and adjacent posterior limb of the internal capsule. A single chronic microhemorrhage is noted in the right frontal white matter. T2 hyperintensities in the cerebral white matter bilaterally are nonspecific but compatible with mild-to-moderate chronic small vessel ischemic  disease. There is mild generalized cerebral atrophy.   Vascular: Major intracranial vascular flow voids are preserved.   Skull and upper cervical spine: Unremarkable bone marrow signal.   Sinuses/Orbits: Bilateral cataract extraction. Paranasal sinuses and mastoid air cells are clear.   Other: None.   IMPRESSION: 1. Acute left thalamocapsular infarct. 2. 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 disease  COGNITION: Overall cognitive status: Within functional limits for tasks assessed   SENSATION: WFL  COORDINATION: Decreased coordination   POSTURE: rounded shoulders and forward head  LOWER EXTREMITY ROM:  all WFL    LOWER EXTREMITY MMT:  R hip flexion 3+/5 R knee extension 4-/5    TRANSFERS: Assistive device utilized: Environmental consultant - 4 wheeled  Sit to stand: CGA Stand to sit: SBA  STAIRS: Level of Assistance: CGA Stair Negotiation Technique: Step to Pattern with Single Rail on Right  GAIT: Gait pattern: step to pattern, decreased stance time- Right, decreased stride length, decreased hip/knee flexion- Right, ataxic, wide BOS, and poor foot clearance- Right Distance walked: in clinic distances Assistive device utilized: Environmental consultant - 4 wheeled Level of assistance: SBA   FUNCTIONAL TESTS:  5 times sit to stand: 19.81s Timed up and go (TUG): 16.25s Berg Balance Scale: 45/56                                                                                                                             TREATMENT DATE:  01/07/24 BITS -number and letter tracking -on balance board hitting targets, moving targets, memory, weight shifting, moving background  NuStep L5x56mins  Leg ext 5# 2x10 HS curls  20# 2x10  01/05/24 Nustep L5x8 minutes all four extremities for reciprocal motion/functional activity tolerance  Gait (for balance challenge) outside up and down hills (struggled a little more on declines), over grass/gravel/pine needles min guard to S  Lateral wt shifts on BAPs  board x2 min min guard One LE on BOSU/other on blue foam 3x30 seconds B Alternating toe taps to top of BOSU from blue foam pad x20  Lateral toe taps to BOSU while standing on blue foam pad x15 B Cross midline toe taps on 6 inch steps x10 B    12/31/23 Walking outdoors around [employer] in the grass  Resisted gait 20# forwards, 10# side steps x4 Shoulder ext 5# 2x10 Seated row 15# 2x10 Bike L4 x50mins  On airex box tap 6" CGA Step up from airex on to 6" mod-minA  STS on airex with band around knees 2x8  12/29/23 NuStep L5x26mins STS on airex 2x10 On airex feet together eyes open and closed  Side step on airex  On airex box taps 6" then step up  Tandem walking  SLS 15s in front of sink   12/24/23 Recheck TUG  Calf raises 2x10  Calf stretch 30s on slant  NuStep L5 x68mins  Leg ext 5# 2x10 HS curls 20# 2x10 Lateral band walks Monster walks  Step ups 6"  Walking on beam      PATIENT EDUCATION: Education details: POC, HEP, education about fall risk Person educated: Patient Education method: Explanation Education comprehension: verbalized understanding  HOME EXERCISE PROGRAM: Access Code: [email or web address] URL: [email or web address] Date: 12/02/2023 Prepared by: Cassie Freer  Exercises - Standing Tandem Balance with Counter Support  - 1 x daily - 7 x weekly - 2 sets - 10 reps - 15 hold - Standing Single Leg Stance with Counter Support  - 1 x daily - 7 x weekly - 2 sets - 10 reps - 10 hold - Standing Hip Abduction with Counter Support  - 1 x daily - 7 x weekly - 2 sets - 10 reps - Seated March with Resistance  - 1 x daily - 7 x weekly - 2 sets - 10 reps - Seated Hip Abduction with Resistance  - 1 x daily - 7 x weekly - 2 sets - 10 reps  GOALS: Goals reviewed with patient? Yes  SHORT TERM GOALS: Target date: 01/13/24  Patient will be independent with initial HEP. Baseline: given 12/02/23 Goal status: MET 12/24/23  2.  Patient will demonstrate  decreased fall risk by scoring < 14 sec on TUG. Baseline: 16.25s without walker Goal status: MET 10.67s    LONG TERM GOALS: Target date: 02/24/24  Patient will be independent with advanced/ongoing HEP to improve outcomes and carryover.  Baseline:  Goal status: INITIAL  2.  Patient will be able to ambulate 500' with normalized gait pattern and good safety to access community.  Baseline: using rollator and some short distances without, decreased hip flexion on R LE Goal status: IN PROGRESS 12/31/23 no AD, but still a bit unsteady, some veering and R foot dragging   3.  Patient will be able to step up/down a flight of steps to safely to access apartment  Baseline: was using stairs in apt prior to stroke Goal status: INITIAL   4.  Patient will demonstrate improved functional LE strength as demonstrated by 5xSTS <14s from chair height. Baseline: 19.81s Goal status: INITIAL  5.  Patient will score 53 on Berg Balance test to demonstrate lower risk of falls. (  MCID= 8 points) Baseline: 45 Goal status: INITIAL  ASSESSMENT:  CLINICAL IMPRESSION: Pt arrives today doing well, her main concerns continues to center around balance. We has BITS machine here to use for session. We were able to do a bunch of different tasks working on balance and multitasking, reaching outside BOS, working on [employer], etc. She did really well and loved the [place] as she feel like it challenged her really well. Ended with some light LE strengthening.    EVAL: Patient is a 78 y.o. female who was seen today for physical therapy evaluation and treatment for CVA on 10/24/23. Currently she is ambulating with a rollator and doing some walking without an AD safely around her apartment. She walks with decreased hip flexion and foot clearance on her RLE during swing phase of gait. With functional tests done today she presents as moderate fall risk. Patient will benefit from skilled PT to address her balance, gait, and strength deficits  to return to PLOF, be able to drive, and go back to work at [employer].    OBJECTIVE IMPAIRMENTS: Abnormal gait, decreased balance, decreased coordination, decreased endurance, difficulty walking, and decreased strength.   ACTIVITY LIMITATIONS: stairs, transfers, and locomotion level  PARTICIPATION LIMITATIONS: driving, shopping, community activity, and occupation  PERSONAL FACTORS: Transportation are also affecting patient's functional outcome.   REHAB POTENTIAL: Good  CLINICAL DECISION MAKING: Stable/uncomplicated  EVALUATION COMPLEXITY: Low  PLAN:  PT FREQUENCY: 2x/week  PT DURATION: 12 weeks  PLANNED INTERVENTIONS: 97110-Therapeutic exercises, 97530- Therapeutic activity, 97112- Neuromuscular re-education, 97535- Self Care, 16109- Manual therapy, [phone]- Gait training, Patient/Family education, Balance training, and Stair training  PLAN FOR NEXT SESSION: 10th visit progress note, keep challenging her balance   Cassie Freer PT, DPT 01/07/24 2:45 PM

## 2024-01-07 ENCOUNTER — Ambulatory Visit: Payer: Medicare Other

## 2024-01-07 ENCOUNTER — Ambulatory Visit: Payer: Medicare Other | Admitting: Occupational Therapy

## 2024-01-07 DIAGNOSIS — M6281 Muscle weakness (generalized): Secondary | ICD-10-CM | POA: Diagnosis not present

## 2024-01-07 DIAGNOSIS — R2681 Unsteadiness on feet: Secondary | ICD-10-CM

## 2024-01-07 DIAGNOSIS — R278 Other lack of coordination: Secondary | ICD-10-CM

## 2024-01-07 DIAGNOSIS — I6381 Other cerebral infarction due to occlusion or stenosis of small artery: Secondary | ICD-10-CM

## 2024-01-07 DIAGNOSIS — R2689 Other abnormalities of gait and mobility: Secondary | ICD-10-CM

## 2024-01-08 ENCOUNTER — Encounter: Payer: Self-pay | Admitting: Neurology

## 2024-01-08 ENCOUNTER — Other Ambulatory Visit (HOSPITAL_COMMUNITY): Payer: Self-pay

## 2024-01-08 ENCOUNTER — Other Ambulatory Visit (HOSPITAL_BASED_OUTPATIENT_CLINIC_OR_DEPARTMENT_OTHER): Payer: Self-pay

## 2024-01-08 MED ORDER — SODIUM FLUORIDE 1.1 % DT GEL
1.0000 | DENTAL | 3 refills | Status: AC
  Filled 2024-01-08 (×2): qty 56, 30d supply, fill #0
  Filled 2024-07-15: qty 56, 30d supply, fill #1
  Filled 2024-07-16: qty 56, 30d supply, fill #0
  Filled 2024-09-24: qty 112, 60d supply, fill #1

## 2024-01-09 ENCOUNTER — Other Ambulatory Visit (HOSPITAL_COMMUNITY): Payer: Self-pay

## 2024-01-09 ENCOUNTER — Other Ambulatory Visit (HOSPITAL_BASED_OUTPATIENT_CLINIC_OR_DEPARTMENT_OTHER): Payer: Self-pay

## 2024-01-13 ENCOUNTER — Encounter: Payer: Self-pay | Admitting: Physical Therapy

## 2024-01-13 ENCOUNTER — Ambulatory Visit: Payer: Medicare Other | Admitting: Physical Therapy

## 2024-01-13 ENCOUNTER — Encounter: Payer: Self-pay | Admitting: Occupational Therapy

## 2024-01-13 ENCOUNTER — Ambulatory Visit: Payer: Medicare Other | Admitting: Occupational Therapy

## 2024-01-13 DIAGNOSIS — R278 Other lack of coordination: Secondary | ICD-10-CM

## 2024-01-13 DIAGNOSIS — R2689 Other abnormalities of gait and mobility: Secondary | ICD-10-CM

## 2024-01-13 DIAGNOSIS — I6381 Other cerebral infarction due to occlusion or stenosis of small artery: Secondary | ICD-10-CM

## 2024-01-13 DIAGNOSIS — R2681 Unsteadiness on feet: Secondary | ICD-10-CM

## 2024-01-13 DIAGNOSIS — M6281 Muscle weakness (generalized): Secondary | ICD-10-CM | POA: Diagnosis not present

## 2024-01-13 DIAGNOSIS — R41844 Frontal lobe and executive function deficit: Secondary | ICD-10-CM

## 2024-01-13 NOTE — Therapy (Unsigned)
 OUTPATIENT OCCUPATIONAL THERAPY NEURO EVALUATION  Patient Name: Jordan Collier MRN: [id number] DOB:April 24, 1946, 78 y.o., female Today's Date: 01/15/2024  PCP: Dr. Harrison Mons REFERRING PROVIDER: Jacalyn Lefevre, NP/ Dr. Carlis Abbott  END OF SESSION:  OT End of Session - 01/14/24 1526     Visit Number 6    Number of Visits 25    Date for OT Re-Evaluation 02/24/24    Authorization Type BCBS MCR    Authorization - Visit Number 6    Progress Note Due on Visit 10    OT Start Time 1448    OT Stop Time 1528    OT Time Calculation (min) 40 min    Activity Tolerance Patient tolerated treatment well    Behavior During Therapy WFL for tasks assessed/performed               Past Medical History:  Diagnosis Date   Allergy to mold    Hypercholesterolemia    Psoriasis    Trigger finger, right middle finger    Past Surgical History:  Procedure Laterality Date   TRIGGER FINGER RELEASE Right 01/2023   TUBAL LIGATION N/A 1980   Patient Active Problem List   Diagnosis Date Noted   History of shingles 11/11/2023   Vitamin D insufficiency 11/04/2023   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   Hyperlipemia 10/27/2023   Stroke determined by clinical assessment (HCC) 10/24/2023    ONSET DATE: 10/24/23  REFERRING DIAG:  Diagnosis  I63.81 (ICD-10-CM) - Thalamic stroke (HCC)    THERAPY DIAG:  Muscle weakness (generalized)  Other lack of coordination  Frontal lobe and executive function deficit  Unsteadiness on feet  Other abnormalities of gait and mobility  Rationale for Evaluation and Treatment: Rehabilitation  SUBJECTIVE:   SUBJECTIVE STATEMENT: Pt reports she is splitting up the exercises and it is going better  Pt accompanied by: self  PERTINENT HISTORY: Pt is a 78 y/o female presenting 1/3 with sudden onset of R sided weakness.  TNK administered.  MRI (+) Acute left thalamocapsular infarct. PMH; hyperlipidemia       PRECAUTIONS: Fall  WEIGHT BEARING RESTRICTIONS:  No  PAIN:  no   FALLS: Has patient fallen in last 6 months? No  LIVING ENVIRONMENT: Lives with: lives alone Lives in: House/apartment Stairs: No, has elevator Has following equipment at home:  rollator  PLOF: Independent  PATIENT GOALS: improve function in RUE  OBJECTIVE:  Note: Objective measures were completed at Evaluation unless otherwise noted.  HAND DOMINANCE: LUE  ADLs: Overall ADLs: modified Independent Transfers/ambulation related to ADLs:mod I with rollator  Tub Shower transfers: walk in shower with seat Equipment:  rollator  IADLs:  Meal Prep: has cooked 1 meal however she is able to go dining room/ restaurant at [employer]   MOBILITY STATUS:  mod I with rollator    ACTIVITY TOLERANCE: Activity tolerance: fatigues more quickly   FUNCTIONAL OUTCOME MEASURES: Quick Dash: 36% disability  UPPER EXTREMITY ROM:  WFLS for LUE, grossly WFLs for RUE with exception of internal rotation/ difficulty reaching behind back   Active ROM Right eval Left eval  Shoulder flexion    Shoulder abduction    Shoulder adduction    Shoulder extension    Shoulder internal rotation  grossly 75%  Shoulder external rotation    Elbow flexion    Elbow extension    Wrist flexion    Wrist extension    Wrist ulnar deviation    Wrist radial deviation    Wrist pronation    Wrist supination    (  Blank rows = not tested)  UPPER EXTREMITY MMT:     MMT Right eval Left eval  Shoulder flexion 3+/5 4+/5  Shoulder abduction    Shoulder adduction    Shoulder extension    Shoulder internal rotation    Shoulder external rotation    Middle trapezius    Lower trapezius    Elbow flexion 4/5 4+/5  Elbow extension 4/5 4+/5  Wrist flexion    Wrist extension    Wrist ulnar deviation    Wrist radial deviation    Wrist pronation    Wrist supination    (Blank rows = not tested)  HAND FUNCTION: Grip strength: Right: 30 lbs; Left: 45 lbs  COORDINATION: 9 Hole Peg test:  Right: 35.17 sec; Left: 28.32 sec, Box/ blocks: RUE: 45 blocks, LUE 55 blocks  SENSATION: WFL    COGNITION: Overall cognitive status:  delayed processing per pt report. Pt spells"WORLD" backwards correctly, pt was able to correctly perfrom clock drawing task   VISION ASSESSMENT: Not tested- denies visual changes from CVA      OBSERVATIONS: 78 y.o female is highly motivated to improve so she can return to work                                                                                                                             TREATMENT DATE: 01/13/24- UBE x 6 mins for conditioning Simulated work activities lifting and passing a  5 lbs weight from right to left hand as if scanning items then back simulated picking up 5 lbs from floor to chair, and counter top, no LOB. Stacking blocks with left and right UE's simultaneously, min v.c Gripper set at level 2 for sustained grip with RUE, min difficulty/ drops Fine motor coordination activities with RUE for improved coordiantion, min difficulty/ v.c  12/31/23 UBE x 6 mins for conditioning Upgraded HEP to red band 15 reps each, for shoulder abduction, biceps curls, triceps ext, shoulder flexion and shoulder extension, min v.c and demonstration to initate, then pt returned demonstration Copying small peg design on vertical surface for functional reaching just at eye level and fine motor coordination with a cognitive component, min-mod drops, Pt copied design with several errors, pt corrected with min v.c  12/24/23- UBE x 6 mins level 3 for conditioning. Therapist upgraded putty to red and reveiwed HEP. Placing and removing grooved pegs from pegboard withh RUE for improved fine motor coordiantion, min v.c  Today her grip is 40 lbs for left and right UE's 3# Weighted cane for shoulder flexion and biceps curls 15 reps each min v.c  Pt was provided with an exercise flow sheet and therapist made recommendations regarding frequency so that pt  is not overwhelmed. Pt verbalized understanding.  12/22/23-Reviewed yellow theraband HEP, 10-15 reps each for bilateral UE's mod v.c and demonstration. Ambulating while tossing ball between hands, then performing category generation for naming food for each letter of the alphabet, min-mod v.c for generating  foods, min v.c for balance  discussion with pt about how divided/ alternating attention would impact driving and therapist recommends additional work in this area prior to driving as pt demonstrated instability at times and required increased v.c for category generation Copying small peg design with RUE for coordination with a visual component, pt was able to hold several pegs in her hand to manipulate while placing in the peg board, min difficulty.   12/19/23:  Pt instructed in coordination HEP and yellow theraband HEP--see pt education section.  12/02/23- eval completed , see education      PATIENT EDUCATION: Education details:exercise flow sheet Person educated: Patient Education method: Explanation, Demonstration, Verbal cues, handout Education comprehension: verbalized understanding, returned demonstration  HOME EXERCISE PROGRAM: Previously was issued putty HEP 12/18/22:  Coordination HEP, Yellow theraband HEP   GOALS: Goals reviewed with patient? Yes  SHORT TERM GOALS: Target date: 12/29/23  I with inital HEP  Goal status: met, 12/24/23  2.  Pt will demonstrate improved RUE functional use for ADLs as evidenced by increasing box/ blocks score by 4 blocks.  Goal status: ongoing, 12/22/23  3.   Pt. will demonstrate ability to perfrom a physical and cognitive task simultaneously  with 90% or better accuracy in prep for return to work and driving.  Goal status: ongoing  4.  Pt will increase RUE grip strength by 5 lbs for increased RUE functional use.  Goal status: met, 12/24/23  5.  Pt will report increased ease with separating paper/ pages with RUE.  Goal status:  ongoing  6.  Pt will bathe her back modified indpendently with RUE.  Goal status: met, per pt report, pt demonstrates ability to perform 12/24/23  LONG TERM GOALS: Target date: 02/24/24  I with updated HEP.  Goal status: ongoing  2.  Pt will demonstrate improved fine motor coordination for ADLs as evidenced by decreasing RUE 9 hole peg test score by 3 secs.  Goal status: ongoing  3.  Pt will perform simulated work activities modified independently.  Goal status: INITIAL  4.  Pt will report increased ease and accuracy with typing.  Goal status: ongoing  5.  Pt will demonstrate ability to retrieve 3 lbs from overhead shelf with good control. Baseline:  Goal status: INITIAL   ASSESSMENT:  CLINICAL IMPRESSION: Pt is progressing towards goals.She desires to return to work soon. Pt performed simulated work activities.  PERFORMANCE DEFICITS: in functional skills including ADLs, IADLs, coordination, dexterity, ROM, strength, pain, flexibility, Fine motor control, Gross motor control, mobility, balance, endurance, decreased knowledge of precautions, decreased knowledge of use of DME, and UE functional use, cognitive skills including attention, safety awareness, and thought, and psychosocial skills including coping strategies, environmental adaptation, habits, interpersonal interactions, and routines and behaviors.   IMPAIRMENTS: are limiting patient from ADLs, IADLs, work, play, leisure, and social participation.   CO-MORBIDITIES: may have co-morbidities  that affects occupational performance. Patient will benefit from skilled OT to address above impairments and improve overall function.  MODIFICATION OR ASSISTANCE TO COMPLETE EVALUATION: No modification of tasks or assist necessary to complete an evaluation.  OT OCCUPATIONAL PROFILE AND HISTORY: Detailed assessment: Review of records and additional review of physical, cognitive, psychosocial history related to current functional  performance.  CLINICAL DECISION MAKING: LOW - limited treatment options, no task modification necessary  REHAB POTENTIAL: Good  EVALUATION COMPLEXITY: Low    PLAN:  OT FREQUENCY: 2x/week  OT DURATION: 12 weeks, will likely d/c after 6-8 weeks dependent on progress.  PLANNED  INTERVENTIONS: 97168 OT Re-evaluation, 97535 self care/ADL training, 40981 therapeutic exercise, 97530 therapeutic activity, 97112 neuromuscular re-education, 97140 manual therapy, 97116 gait training, 19147 aquatic therapy, 97035 ultrasound, 97018 paraffin, 82956 moist heat, 97010 cryotherapy, 97129 Cognitive training (first 15 min), 21308 Cognitive training(each additional 15 min), passive range of motion, balance training, stair training, functional mobility training, psychosocial skills training, energy conservation, coping strategies training, patient/family education, and DME and/or AE instructions  RECOMMENDED OTHER SERVICES: PT  CONSULTED AND AGREED WITH PLAN OF CARE: Patient  PLAN FOR NEXT SESSION:   coordination, strength and endurance   pt is highly motivated to improve and to return to work at [employer], OTR/L 01/15/2024, 11:49 AM

## 2024-01-13 NOTE — Therapy (Signed)
 OUTPATIENT PHYSICAL THERAPY NEURO TREATMENT/PROGRESS NOTE   Patient Name: Jordan Collier MRN: [id number] DOB:04-Mar-1946, 78 y.o., female Today's Date: 01/13/2024   Progress Note Reporting Period 12/02/23 to 01/13/24  See note below for Objective Data and Assessment of Progress/Goals.       PCP: Blenda Mounts REFERRING PROVIDER: Jacalyn Lefevre  END OF SESSION:  PT End of Session - 01/13/24 1349     Visit Number 10    Date for PT Re-Evaluation 02/24/24    Authorization Type [employer]    PT Start Time 1348    PT Stop Time 1426    PT Time Calculation (min) 38 min    Activity Tolerance Patient tolerated treatment well    Behavior During Therapy WFL for tasks assessed/performed                      Past Medical History:  Diagnosis Date   Allergy to mold    Hypercholesterolemia    Psoriasis    Trigger finger, right middle finger    Past Surgical History:  Procedure Laterality Date   TRIGGER FINGER RELEASE Right 01/2023   TUBAL LIGATION N/A 1980   Patient Active Problem List   Diagnosis Date Noted   History of shingles 11/11/2023   Vitamin D insufficiency 11/04/2023   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   Hyperlipemia 10/27/2023   Stroke determined by clinical assessment (HCC) 10/24/2023    ONSET DATE: 10/24/23  REFERRING DIAG:  I63.81 (ICD-10-CM) - Thalamic stroke (HCC)    THERAPY DIAG:  Muscle weakness (generalized)  Unsteadiness on feet  Thalamic stroke (HCC)  Other abnormalities of gait and mobility  Rationale for Evaluation and Treatment: Rehabilitation  SUBJECTIVE:                                                                                                                                                                                             SUBJECTIVE STATEMENT:  I was pretty sore after the bioness, but felt good. Balance is still off, when I feel fatigued my right quad/knee really gets tired too. It feels strange bc my head  isn't tired but I know I need to rest the leg. Feel at 75/100 right now, with balance being my big concern.    EVAL: I am okay, a little pooped. My dexterity is off, I don't feel like I have the balance like I used to when I left the hospital.   Pt accompanied by: self  PERTINENT HISTORY: Ms. Marleigh Kaylor is a 78 y.o. female with history of hypercholesterolemia, herpes simplex, cataracts presenting 1/3 with acute onset of right-sided weakness. Patient  called EMS after she had trouble getting out of her car due to her right leg weakness. CODE STROKE was activated by EMS.  Denies any hitroy of strokes, headaches, does not take blood thinners. CTH negative. Patient was unable to walk on assessment, even leaning to right while trying to simply sit up. Management with thrombolytic therapy was explained to the patient, or patient's representative, as were risks, benefits and alternatives. All questions were answered. Patient, or patient's representative, expressed understanding of the treatment plan and agreed to proceed with thrombolytic treatment.  PAIN:  Are you having pain? No 0/10  PRECAUTIONS: Fall  RED FLAGS: None   WEIGHT BEARING RESTRICTIONS: No  FALLS: Has patient fallen in last 6 months? Yes. Number of falls 1 fell in the [facility] driveway Sept 5th  LIVING ENVIRONMENT: Lives with: lives alone Lives in: House/apartment Stairs:  has both an Engineer, structural or can take the stairs, normally she used to take the stairs  Has following equipment at home: Dan Humphreys - 4 wheeled  PLOF: Independent and Independent with basic ADLs  PATIENT GOALS: to be able to drive, go back to work, and go up and down steps   OBJECTIVE:  Note: Objective measures were completed at Evaluation unless otherwise noted.  DIAGNOSTIC FINDINGS:  FINDINGS: Brain: There is a 1.5 cm acute infarct involving the lateral aspect of the left thalamus and adjacent posterior limb of the internal capsule. A single  chronic microhemorrhage is noted in the right frontal white matter. T2 hyperintensities in the cerebral white matter bilaterally are nonspecific but compatible with mild-to-moderate chronic small vessel ischemic disease. There is mild generalized cerebral atrophy.   Vascular: Major intracranial vascular flow voids are preserved.   Skull and upper cervical spine: Unremarkable bone marrow signal.   Sinuses/Orbits: Bilateral cataract extraction. Paranasal sinuses and mastoid air cells are clear.   Other: None.   IMPRESSION: 1. Acute left thalamocapsular infarct. 2. 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 disease  COGNITION: Overall cognitive status: Within functional limits for tasks assessed   SENSATION: WFL  COORDINATION: Decreased coordination   POSTURE: rounded shoulders and forward head  LOWER EXTREMITY ROM:  all WFL    LOWER EXTREMITY MMT:  R hip flexion 3+/5 R knee extension 4-/5; 01/13/24 R hip flexion 4+/5, R quad 4+/5    TRANSFERS: Assistive device utilized: Environmental consultant - 4 wheeled  Sit to stand: CGA Stand to sit: SBA  STAIRS: Level of Assistance: CGA Stair Negotiation Technique: Step to Pattern with Single Rail on Right  GAIT: Gait pattern: step to pattern, decreased stance time- Right, decreased stride length, decreased hip/knee flexion- Right, ataxic, wide BOS, and poor foot clearance- Right Distance walked: in clinic distances Assistive device utilized: Walker - 4 wheeled Level of assistance: SBA   FUNCTIONAL TESTS:  5 times sit to stand: 19.81s; 01/13/24 16.8 seconds  Timed up and go (TUG): 16.25s; most recent 10.6 seconds (date taken unknown) Berg Balance Scale: 45/56; 01/13/24 54/56  TREATMENT DATE:   01/13/24  Re-assessment/objective data for progress note, education on POC and goals met/yet unmet     01/13/24 0001   Berg Balance Test  Sit to Stand 4  Standing Unsupported 4  Sitting with Back Unsupported but Feet Supported on Floor or Stool 4  Stand to Sit 4  Transfers 4  Standing Unsupported with Eyes Closed 4  Standing Unsupported with Feet Together 4  From Standing, Reach Forward with Outstretched Arm 4  From Standing Position, Pick up Object from Floor 4  From Standing Position, Turn to Look Behind Over each Shoulder 4  Turn 360 Degrees 3  Standing Unsupported, Alternately Place Feet on Step/Stool 4  Standing Unsupported, One Foot in Front 4  Standing on One Leg 3  Total Score 54    Scifit bike L5x8 minutes for tissue perfusion, neural priming seat 12  Lateral wt shifts on BOSU  AP wt shifts on BOSU in wide tandem stance  Standing on flat surface of BOSU with ipsilateral reaches to targets up to MinA for balance     01/07/24 BITS -number and letter tracking -on balance board hitting targets, moving targets, memory, weight shifting, moving background  NuStep L5x40mins  Leg ext 5# 2x10 HS curls 20# 2x10  01/05/24 Nustep L5x8 minutes all four extremities for reciprocal motion/functional activity tolerance  Gait (for balance challenge) outside up and down hills (struggled a little more on declines), over grass/gravel/pine needles min guard to S  Lateral wt shifts on BAPs board x2 min min guard One LE on BOSU/other on blue foam 3x30 seconds B Alternating toe taps to top of BOSU from blue foam pad x20  Lateral toe taps to BOSU while standing on blue foam pad x15 B Cross midline toe taps on 6 inch steps x10 B    12/31/23 Walking outdoors around [employer] in the grass  Resisted gait 20# forwards, 10# side steps x4 Shoulder ext 5# 2x10 Seated row 15# 2x10 Bike L4 x56mins  On airex box tap 6" CGA Step up from airex on to 6" mod-minA  STS on airex with band around knees 2x8  12/29/23 NuStep L5x64mins STS on airex 2x10 On airex feet together eyes open and closed  Side  step on airex  On airex box taps 6" then step up  Tandem walking  SLS 15s in front of sink   12/24/23 Recheck TUG  Calf raises 2x10  Calf stretch 30s on slant  NuStep L5 x2mins  Leg ext 5# 2x10 HS curls 20# 2x10 Lateral band walks Monster walks  Step ups 6"  Walking on beam      PATIENT EDUCATION: Education details: POC, HEP, education about fall risk Person educated: Patient Education method: Explanation Education comprehension: verbalized understanding  HOME EXERCISE PROGRAM: Access Code: [email or web address] URL: [email or web address] Date: 12/02/2023 Prepared by: Cassie Freer  Exercises - Standing Tandem Balance with Counter Support  - 1 x daily - 7 x weekly - 2 sets - 10 reps - 15 hold - Standing Single Leg Stance with Counter Support  - 1 x daily - 7 x weekly - 2 sets - 10 reps - 10 hold - Standing Hip Abduction with Counter Support  - 1 x daily - 7 x weekly - 2 sets - 10 reps - Seated March with Resistance  - 1 x daily - 7 x weekly - 2 sets - 10 reps - Seated Hip Abduction with Resistance  - 1 x daily - 7 x weekly -  2 sets - 10 reps  GOALS: Goals reviewed with patient? Yes  SHORT TERM GOALS: Target date: 01/13/24  Patient will be independent with initial HEP. Baseline: given 12/02/23 Goal status: MET 12/24/23  2.  Patient will demonstrate decreased fall risk by scoring < 14 sec on TUG. Baseline: 16.25s without walker Goal status: MET 10.67s    LONG TERM GOALS: Target date: 02/24/24  Patient will be independent with advanced/ongoing HEP to improve outcomes and carryover.  Baseline:  Goal status: IN PROGRESS 01/13/24  2.  Patient will be able to ambulate 500' with normalized gait pattern and good safety to access community.  Baseline: using rollator and some short distances without, decreased hip flexion on R LE Goal status: IN PROGRESS 01/13/24- gait mechanics do worsen with fatigue   3.  Patient will be able to step up/down a flight of steps to safely  to access apartment  Baseline: was using stairs in apt prior to stroke Goal status: IN PROGRESS 01/13/24   4.  Patient will demonstrate improved functional LE strength as demonstrated by 5xSTS <14s from chair height. Baseline: 19.81s Goal status: IN PROGRESS 01/13/24  5.  Patient will score 53 on Berg Balance test to demonstrate lower risk of falls. (MCID= 8 points) Baseline: 45 Goal status: MET 01/13/24 54  ASSESSMENT:  CLINICAL IMPRESSION:  Session focus on getting updated measures for 10th visit progress note today. She is really making excellent progress with main remaining impairments being functional activity tolerance, functional strength, and high level balance. Will benefit from a brief extension of skilled PT services moving forward however anticipate that she will be ready for DC to independent exercise program by her last currently scheduled PT session on 02/12/24 if not before.     EVAL: Patient is a 78 y.o. female who was seen today for physical therapy evaluation and treatment for CVA on 10/24/23. Currently she is ambulating with a rollator and doing some walking without an AD safely around her apartment. She walks with decreased hip flexion and foot clearance on her RLE during swing phase of gait. With functional tests done today she presents as moderate fall risk. Patient will benefit from skilled PT to address her balance, gait, and strength deficits to return to PLOF, be able to drive, and go back to work at [employer].    OBJECTIVE IMPAIRMENTS: Abnormal gait, decreased balance, decreased coordination, decreased endurance, difficulty walking, and decreased strength.   ACTIVITY LIMITATIONS: stairs, transfers, and locomotion level  PARTICIPATION LIMITATIONS: driving, shopping, community activity, and occupation  PERSONAL FACTORS: Transportation are also affecting patient's functional outcome.   REHAB POTENTIAL: Good  CLINICAL DECISION MAKING:  Stable/uncomplicated  EVALUATION COMPLEXITY: Low  PLAN:  PT FREQUENCY: 2x/week  PT DURATION: 12 weeks  PLANNED INTERVENTIONS: 97110-Therapeutic exercises, 97530- Therapeutic activity, [id number]- Neuromuscular re-education, 97535- Self Care, 16109- Manual therapy, [phone]- Gait training, Patient/Family education, Balance training, and Stair training  PLAN FOR NEXT SESSION: high level balance, functional activity tolerance, higher level functional strengthening. Likely DC 02/12/24 if not before (progressing extremely well)   Nedra Hai, PT, DPT 01/13/24 2:27 PM

## 2024-01-14 ENCOUNTER — Encounter: Payer: Self-pay | Admitting: Occupational Therapy

## 2024-01-14 NOTE — Therapy (Signed)
 OUTPATIENT PHYSICAL THERAPY NEURO TREATMENT/PROGRESS NOTE   Patient Name: Jordan Collier MRN: [id number] DOB:1946-02-21, 78 y.o., female Today's Date: 01/14/2024   Progress Note Reporting Period 12/02/23 to 01/13/24  See note below for Objective Data and Assessment of Progress/Goals.       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  Rationale for Evaluation and Treatment: Rehabilitation  SUBJECTIVE:                                                                                                                                                                                             SUBJECTIVE STATEMENT:  I was pretty sore after the bioness, but felt good. Balance is still off, when I feel fatigued my right quad/knee really gets tired too. It feels strange bc my head isn't tired but I know I need to rest the leg. Feel at 75/100 right now, with balance being my big concern.    EVAL: I am okay, a little pooped. My dexterity is off, I don't feel like I have the balance like I used to when I left the hospital.   Pt accompanied by: self  PERTINENT HISTORY: [name] is a 78 y.o. female with history of hypercholesterolemia, herpes simplex, cataracts presenting 1/3 with acute onset of  right-sided weakness. Patient called EMS after she had trouble getting out of her car due to her right leg weakness. CODE STROKE was activated by EMS.  Denies any hitroy of strokes, headaches, does not take blood thinners. CTH negative. Patient was unable to walk on assessment, even leaning to right while trying to simply sit up. Management with thrombolytic therapy was explained to the patient, or patient's representative, as were risks, benefits and alternatives. All questions were answered. Patient, or patient's representative, expressed understanding of the treatment plan and agreed to proceed with thrombolytic treatment.  PAIN:  Are you having pain? No 0/10  PRECAUTIONS: Fall  RED FLAGS: None   WEIGHT BEARING RESTRICTIONS: No  FALLS: Has patient fallen in last 6 months? Yes. Number of falls 1 fell in the [facility] driveway Sept 5th  LIVING ENVIRONMENT: Lives with: lives alone Lives in: House/apartment Stairs:  has both an Engineer, structural or can take the stairs, normally she used to take the stairs  Has following equipment at home: Dan Humphreys - 4 wheeled  PLOF: Independent and Independent with basic ADLs  PATIENT GOALS: to be able to drive, go back to work, and go up and down steps   OBJECTIVE:  Note: Objective measures were completed at Evaluation unless otherwise noted.  DIAGNOSTIC FINDINGS:  FINDINGS: Brain: There is a 1.5 cm acute infarct involving the lateral aspect of the left thalamus and adjacent posterior limb of the internal capsule. A single chronic microhemorrhage is noted in the right frontal white matter. T2 hyperintensities in the cerebral white matter bilaterally are nonspecific but compatible with mild-to-moderate chronic small vessel ischemic disease. There is mild generalized cerebral atrophy.   Vascular: Major intracranial vascular flow voids are preserved.   Skull and upper cervical spine: Unremarkable bone marrow signal.   Sinuses/Orbits: Bilateral cataract  extraction. Paranasal sinuses and mastoid air cells are clear.   Other: None.   IMPRESSION: 1. Acute left thalamocapsular infarct. 2. 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 disease  COGNITION: Overall cognitive status: Within functional limits for tasks assessed   SENSATION: WFL  COORDINATION: Decreased coordination   POSTURE: rounded shoulders and forward head  LOWER EXTREMITY ROM:  all WFL    LOWER EXTREMITY MMT:  R hip flexion 3+/5 R knee extension 4-/5; 01/13/24 R hip flexion 4+/5, R quad 4+/5    TRANSFERS: Assistive device utilized: Environmental consultant - 4 wheeled  Sit to stand: CGA Stand to sit: SBA  STAIRS: Level of Assistance: CGA Stair Negotiation Technique: Step to Pattern with Single Rail on Right  GAIT: Gait pattern: step to pattern, decreased stance time- Right, decreased stride length, decreased hip/knee flexion- Right, ataxic, wide BOS, and poor foot clearance- Right Distance walked: in clinic distances Assistive device utilized: Environmental consultant - 4 wheeled Level of assistance: SBA   FUNCTIONAL TESTS:  5 times sit to stand: 19.81s; 01/13/24 16.8 seconds  Timed up and go (TUG): 16.25s; most recent 10.6 seconds (date taken unknown) Berg Balance Scale: 45/56; 01/13/24 54/56                                                                                                                              TREATMENT DATE:  01/15/24 NuStep Standing on BOSU reaching targets Standing on BOSU catch  Step ups on BOSU forwards and laterally  Walking with direction changes  Seated row Lat pull down     01/13/24  Re-assessment/objective data for progress note, education on POC and goals met/yet unmet     01/13/24 0001  Berg Balance Test  Sit to Stand  4  Standing Unsupported 4  Sitting with Back Unsupported but Feet Supported on Floor or Stool 4  Stand to Sit 4  Transfers 4  Standing Unsupported with Eyes Closed 4  Standing Unsupported with Feet Together 4  From  Standing, Reach Forward with Outstretched Arm 4  From Standing Position, Pick up Object from Floor 4  From Standing Position, Turn to Look Behind Over each Shoulder 4  Turn 360 Degrees 3  Standing Unsupported, Alternately Place Feet on Step/Stool 4  Standing Unsupported, One Foot in Front 4  Standing on One Leg 3  Total Score 54    Scifit bike L5x8 minutes for tissue perfusion, neural priming seat 12  Lateral wt shifts on BOSU  AP wt shifts on BOSU in wide tandem stance  Standing on flat surface of BOSU with ipsilateral reaches to targets up to MinA for balance     01/07/24 BITS -number and letter tracking -on balance board hitting targets, moving targets, memory, weight shifting, moving background  NuStep L5x83mins  Leg ext 5# 2x10 HS curls 20# 2x10  01/05/24 Nustep L5x8 minutes all four extremities for reciprocal motion/functional activity tolerance  Gait (for balance challenge) outside up and down hills (struggled a little more on declines), over grass/gravel/pine needles min guard to S  Lateral wt shifts on BAPs board x2 min min guard One LE on BOSU/other on blue foam 3x30 seconds B Alternating toe taps to top of BOSU from blue foam pad x20  Lateral toe taps to BOSU while standing on blue foam pad x15 B Cross midline toe taps on 6 inch steps x10 B    12/31/23 Walking outdoors around [employer] in the grass  Resisted gait 20# forwards, 10# side steps x4 Shoulder ext 5# 2x10 Seated row 15# 2x10 Bike L4 x15mins  On airex box tap 6" CGA Step up from airex on to 6" mod-minA  STS on airex with band around knees 2x8  12/29/23 NuStep L5x31mins STS on airex 2x10 On airex feet together eyes open and closed  Side step on airex  On airex box taps 6" then step up  Tandem walking  SLS 15s in front of sink   12/24/23 Recheck TUG  Calf raises 2x10  Calf stretch 30s on slant  NuStep L5 x71mins  Leg ext 5# 2x10 HS curls 20# 2x10 Lateral band walks Monster walks   Step ups 6"  Walking on beam      PATIENT EDUCATION: Education details: POC, HEP, education about fall risk Person educated: Patient Education method: Explanation Education comprehension: verbalized understanding  HOME EXERCISE PROGRAM: Access Code: [email or web address] URL: [email or web address] Date: 12/02/2023 Prepared by: Cassie Freer  Exercises - Standing Tandem Balance with Counter Support  - 1 x daily - 7 x weekly - 2 sets - 10 reps - 15 hold - Standing Single Leg Stance with Counter Support  - 1 x daily - 7 x weekly - 2 sets - 10 reps - 10 hold - Standing Hip Abduction with Counter Support  - 1 x daily - 7 x weekly - 2 sets - 10 reps - Seated March with Resistance  - 1 x daily - 7 x weekly - 2 sets - 10 reps - Seated Hip Abduction with Resistance  - 1 x daily - 7 x weekly - 2 sets - 10 reps  GOALS: Goals reviewed with patient? Yes  SHORT TERM GOALS: Target date: 01/13/24  Patient will be independent with initial HEP. Baseline: given 12/02/23 Goal  status: MET 12/24/23  2.  Patient will demonstrate decreased fall risk by scoring < 14 sec on TUG. Baseline: 16.25s without walker Goal status: MET 10.67s    LONG TERM GOALS: Target date: 02/24/24  Patient will be independent with advanced/ongoing HEP to improve outcomes and carryover.  Baseline:  Goal status: IN PROGRESS 01/13/24  2.  Patient will be able to ambulate 500' with normalized gait pattern and good safety to access community.  Baseline: using rollator and some short distances without, decreased hip flexion on R LE Goal status: IN PROGRESS 01/13/24- gait mechanics do worsen with fatigue   3.  Patient will be able to step up/down a flight of steps to safely to access apartment  Baseline: was using stairs in apt prior to stroke Goal status: IN PROGRESS 01/13/24   4.  Patient will demonstrate improved functional LE strength as demonstrated by 5xSTS <14s from chair height. Baseline: 19.81s Goal status: IN  PROGRESS 01/13/24  5.  Patient will score 53 on Berg Balance test to demonstrate lower risk of falls. (MCID= 8 points) Baseline: 45 Goal status: MET 01/13/24 54  ASSESSMENT:  CLINICAL IMPRESSION:  Session focus on getting updated measures for 10th visit progress note today. She is really making excellent progress with main remaining impairments being functional activity tolerance, functional strength, and high level balance. Will benefit from a brief extension of skilled PT services moving forward however anticipate that she will be ready for DC to independent exercise program by her last currently scheduled PT session on 02/12/24 if not before.     EVAL: Patient is a 78 y.o. female who was seen today for physical therapy evaluation and treatment for CVA on 10/24/23. Currently she is ambulating with a rollator and doing some walking without an AD safely around her apartment. She walks with decreased hip flexion and foot clearance on her RLE during swing phase of gait. With functional tests done today she presents as moderate fall risk. Patient will benefit from skilled PT to address her balance, gait, and strength deficits to return to PLOF, be able to drive, and go back to work at [employer].    OBJECTIVE IMPAIRMENTS: Abnormal gait, decreased balance, decreased coordination, decreased endurance, difficulty walking, and decreased strength.   ACTIVITY LIMITATIONS: stairs, transfers, and locomotion level  PARTICIPATION LIMITATIONS: driving, shopping, community activity, and occupation  PERSONAL FACTORS: Transportation are also affecting patient's functional outcome.   REHAB POTENTIAL: Good  CLINICAL DECISION MAKING: Stable/uncomplicated  EVALUATION COMPLEXITY: Low  PLAN:  PT FREQUENCY: 2x/week  PT DURATION: 12 weeks  PLANNED INTERVENTIONS: 97110-Therapeutic exercises, 97530- Therapeutic activity, [id number]- Neuromuscular re-education, 97535- Self Care, 40981- Manual therapy,  [phone]- Gait training, Patient/Family education, Balance training, and Stair training  PLAN FOR NEXT SESSION: high level balance, functional activity tolerance, higher level functional strengthening. Likely DC 02/12/24 if not before (progressing extremely well)   Nedra Hai, PT, DPT 01/14/24 2:45 PM

## 2024-01-15 ENCOUNTER — Ambulatory Visit: Payer: Medicare Other | Admitting: Occupational Therapy

## 2024-01-15 ENCOUNTER — Ambulatory Visit: Payer: Medicare Other

## 2024-01-15 DIAGNOSIS — R278 Other lack of coordination: Secondary | ICD-10-CM

## 2024-01-15 DIAGNOSIS — R41844 Frontal lobe and executive function deficit: Secondary | ICD-10-CM

## 2024-01-15 DIAGNOSIS — R2681 Unsteadiness on feet: Secondary | ICD-10-CM

## 2024-01-15 DIAGNOSIS — M6281 Muscle weakness (generalized): Secondary | ICD-10-CM | POA: Diagnosis not present

## 2024-01-15 DIAGNOSIS — R2689 Other abnormalities of gait and mobility: Secondary | ICD-10-CM

## 2024-01-15 NOTE — Therapy (Signed)
 OUTPATIENT OCCUPATIONAL THERAPY NEURO Treatment  Patient Name: Jordan Collier MRN: [id number] DOB:31-Oct-1945, 78 y.o., female Today's Date: 01/15/2024  PCP: Dr. Harrison Mons REFERRING PROVIDER: Jacalyn Lefevre, NP/ Dr. Carlis Abbott  END OF SESSION:  OT End of Session - 01/15/24 1150     Visit Number 7    Number of Visits 25    Date for OT Re-Evaluation 02/24/24    Authorization Type BCBS MCR    Authorization - Visit Number 7    Progress Note Due on Visit 10    OT Start Time 1146    OT Stop Time 1226    OT Time Calculation (min) 40 min                Past Medical History:  Diagnosis Date   Allergy to mold    Hypercholesterolemia    Psoriasis    Trigger finger, right middle finger    Past Surgical History:  Procedure Laterality Date   TRIGGER FINGER RELEASE Right 01/2023   TUBAL LIGATION N/A 1980   Patient Active Problem List   Diagnosis Date Noted   History of shingles 11/11/2023   Vitamin D insufficiency 11/04/2023   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   Hyperlipemia 10/27/2023   Stroke determined by clinical assessment (HCC) 10/24/2023    ONSET DATE: 10/24/23  REFERRING DIAG:  Diagnosis  I63.81 (ICD-10-CM) - Thalamic stroke (HCC)    THERAPY DIAG:  Muscle weakness (generalized)  Other lack of coordination  Frontal lobe and executive function deficit  Unsteadiness on feet  Other abnormalities of gait and mobility  Rationale for Evaluation and Treatment: Rehabilitation  SUBJECTIVE:   SUBJECTIVE STATEMENT: Pt reports her exercises are going well Pt accompanied by: self  PERTINENT HISTORY: Pt is a 78 y/o female presenting 1/3 with sudden onset of R sided weakness.  TNK administered.  MRI (+) Acute left thalamocapsular infarct. PMH; hyperlipidemia       PRECAUTIONS: Fall  WEIGHT BEARING RESTRICTIONS: No  PAIN:  no   FALLS: Has patient fallen in last 6 months? No  LIVING ENVIRONMENT: Lives with: lives alone Lives in: House/apartment Stairs: No,  has elevator Has following equipment at home:  rollator  PLOF: Independent  PATIENT GOALS: improve function in RUE  OBJECTIVE:  Note: Objective measures were completed at Evaluation unless otherwise noted.  HAND DOMINANCE: LUE  ADLs: Overall ADLs: modified Independent Transfers/ambulation related to ADLs:mod I with rollator  Tub Shower transfers: walk in shower with seat Equipment:  rollator  IADLs:  Meal Prep: has cooked 1 meal however she is able to go dining room/ restaurant at [employer]   MOBILITY STATUS:  mod I with rollator    ACTIVITY TOLERANCE: Activity tolerance: fatigues more quickly   FUNCTIONAL OUTCOME MEASURES: Quick Dash: 36% disability  UPPER EXTREMITY ROM:  WFLS for LUE, grossly WFLs for RUE with exception of internal rotation/ difficulty reaching behind back   Active ROM Right eval Left eval  Shoulder flexion    Shoulder abduction    Shoulder adduction    Shoulder extension    Shoulder internal rotation  grossly 75%  Shoulder external rotation    Elbow flexion    Elbow extension    Wrist flexion    Wrist extension    Wrist ulnar deviation    Wrist radial deviation    Wrist pronation    Wrist supination    (Blank rows = not tested)  UPPER EXTREMITY MMT:     MMT Right eval Left eval  Shoulder flexion 3+/5  4+/5  Shoulder abduction    Shoulder adduction    Shoulder extension    Shoulder internal rotation    Shoulder external rotation    Middle trapezius    Lower trapezius    Elbow flexion 4/5 4+/5  Elbow extension 4/5 4+/5  Wrist flexion    Wrist extension    Wrist ulnar deviation    Wrist radial deviation    Wrist pronation    Wrist supination    (Blank rows = not tested)  HAND FUNCTION: Grip strength: Right: 30 lbs; Left: 45 lbs  COORDINATION: 9 Hole Peg test: Right: 35.17 sec; Left: 28.32 sec, Box/ blocks: RUE: 45 blocks, LUE 55 blocks  SENSATION: WFL    COGNITION: Overall cognitive status:  delayed  processing per pt report. Pt spells"WORLD" backwards correctly, pt was able to correctly perfrom clock drawing task   VISION ASSESSMENT: Not tested- denies visual changes from CVA      OBSERVATIONS: 78 y.o female is highly motivated to improve so she can return to work                                                                                                                             TREATMENT DATE: 01/15/24- UBE x 6 mins level 3 9 hole peg test: RUE 32.10 Strengthening exercises for bilateral UE's, 2 sets of 10 reps for shoulder flexion, chest press and abduction with 2 lbs weight in each hand, biceps curls with 3 lbs wiehgt, min v.c Simulated work activity lifting a 5 lbs weight to pass from one hand to the next to simulate scanning grocery items, min v.c Copying small peg design on vertical eye level surface with RUE for functional reach and fine motor coordination, min drops, min v.c.  01/13/24- UBE x 6 mins for conditioning Simulated work activities lifting and passing a  5 lbs weight from right to left hand as if scanning items then back simulated picking up 5 lbs from floor to chair, and counter top, no LOB. Stacking blocks with left and right UE's simultaneously, min v.c Gripper set at level 2 for sustained grip with RUE, min difficulty/ drops Fine motor coordination activities with RUE for improved coordination, min difficulty/ v.c  12/31/23 UBE x 6 mins for conditioning Upgraded HEP to red band 15 reps each, for shoulder abduction, biceps curls, triceps ext, shoulder flexion and shoulder extension, min v.c and demonstration to initate, then pt returned demonstration Copying small peg design on vertical surface for functional reaching just at eye level and fine motor coordination with a cognitive component, min-mod drops, Pt copied design with several errors, pt corrected with min v.c  12/24/23- UBE x 6 mins level 3 for conditioning. Therapist upgraded putty to red and  reveiwed HEP. Placing and removing grooved pegs from pegboard withh RUE for improved fine motor coordiantion, min v.c  Today her grip is 40 lbs for left and right UE's 3# Weighted cane for shoulder flexion and  biceps curls 15 reps each min v.c  Pt was provided with an exercise flow sheet and therapist made recommendations regarding frequency so that pt is not overwhelmed. Pt verbalized understanding.  12/22/23-Reviewed yellow theraband HEP, 10-15 reps each for bilateral UE's mod v.c and demonstration. Ambulating while tossing ball between hands, then performing category generation for naming food for each letter of the alphabet, min-mod v.c for generating foods, min v.c for balance  discussion with pt about how divided/ alternating attention would impact driving and therapist recommends additional work in this area prior to driving as pt demonstrated instability at times and required increased v.c for category generation Copying small peg design with RUE for coordination with a visual component, pt was able to hold several pegs in her hand to manipulate while placing in the peg board, min difficulty.   12/19/23:  Pt instructed in coordination HEP and yellow theraband HEP--see pt education section.  12/02/23- eval completed , see education      PATIENT EDUCATION: Education details:see above Person educated: Patient Education method: Explanation, Demonstration, Verbal cues, handout Education comprehension: verbalized understanding, returned demonstration  HOME EXERCISE PROGRAM: Previously was issued putty HEP 12/18/22:  Coordination HEP, Yellow theraband HEP   GOALS: Goals reviewed with patient? Yes  SHORT TERM GOALS: Target date: 12/29/23  I with inital HEP  Goal status: met, 12/24/23  2.  Pt will demonstrate improved RUE functional use for ADLs as evidenced by increasing box/ blocks score by 4 blocks.  Goal status: ongoing, 12/22/23  3.   Pt. will demonstrate ability to perform a  physical and cognitive task simultaneously  with 90% or better accuracy in prep for return to work and driving.  Goal status: ongoing, good performace of category genration, however pt became more unsteday with several drops of ball she was tossing, 80-90%  4.  Pt will increase RUE grip strength by 5 lbs for increased RUE functional use.  Goal status: met, 12/24/23  5.  Pt will report increased ease with separating paper/ pages with RUE.  Goal status: met, at pre morbid level  6.  Pt will bathe her back modified indpendently with RUE.  Goal status: met, per pt report, pt demonstrates ability to perform 12/24/23  LONG TERM GOALS: Target date: 02/24/24  I with updated HEP.  Goal status: ongoing  2.  Pt will demonstrate improved fine motor coordination for ADLs as evidenced by decreasing RUE 9 hole peg test score by 3 secs.  Goal status: met 32.10 secs 01/15/24  3.  Pt will perform simulated work activities modified independently.  Goal status: INITIAL  4.  Pt will report increased ease and accuracy with typing.  Goal status: ongoing  5.  Pt will demonstrate ability to retrieve 3 lbs from overhead shelf with good control. Baseline:  Goal status: INITIAL   ASSESSMENT:  CLINICAL IMPRESSION: Pt is progressing towards goals.She demonstrates improved fine motor coordination. When multi-tasking pt's balance and ability to toss a ball decreased as cognitve demands increased .  PERFORMANCE DEFICITS: in functional skills including ADLs, IADLs, coordination, dexterity, ROM, strength, pain, flexibility, Fine motor control, Gross motor control, mobility, balance, endurance, decreased knowledge of precautions, decreased knowledge of use of DME, and UE functional use, cognitive skills including attention, safety awareness, and thought, and psychosocial skills including coping strategies, environmental adaptation, habits, interpersonal interactions, and routines and behaviors.   IMPAIRMENTS:  are limiting patient from ADLs, IADLs, work, play, leisure, and social participation.   CO-MORBIDITIES: may have co-morbidities  that  affects occupational performance. Patient will benefit from skilled OT to address above impairments and improve overall function.  MODIFICATION OR ASSISTANCE TO COMPLETE EVALUATION: No modification of tasks or assist necessary to complete an evaluation.  OT OCCUPATIONAL PROFILE AND HISTORY: Detailed assessment: Review of records and additional review of physical, cognitive, psychosocial history related to current functional performance.  CLINICAL DECISION MAKING: LOW - limited treatment options, no task modification necessary  REHAB POTENTIAL: Good  EVALUATION COMPLEXITY: Low    PLAN:  OT FREQUENCY: 2x/week  OT DURATION: 12 weeks, will likely d/c after 6-8 weeks dependent on progress.  PLANNED INTERVENTIONS: 97168 OT Re-evaluation, 97535 self care/ADL training, 36644 therapeutic exercise, 97530 therapeutic activity, 97112 neuromuscular re-education, 97140 manual therapy, 97116 gait training, 03474 aquatic therapy, 97035 ultrasound, 97018 paraffin, 25956 moist heat, 97010 cryotherapy, 97129 Cognitive training (first 15 min), 38756 Cognitive training(each additional 15 min), passive range of motion, balance training, stair training, functional mobility training, psychosocial skills training, energy conservation, coping strategies training, patient/family education, and DME and/or AE instructions  RECOMMENDED OTHER SERVICES: PT  CONSULTED AND AGREED WITH PLAN OF CARE: Patient  PLAN FOR NEXT SESSION: work towards unmet goals   pt is highly motivated to improve and to return to work at [employer], OTR/L 01/15/2024, 11:51 AM

## 2024-01-19 ENCOUNTER — Ambulatory Visit: Admitting: Physical Therapy

## 2024-01-19 ENCOUNTER — Encounter: Payer: Self-pay | Admitting: Physical Therapy

## 2024-01-19 DIAGNOSIS — M6281 Muscle weakness (generalized): Secondary | ICD-10-CM

## 2024-01-19 DIAGNOSIS — R278 Other lack of coordination: Secondary | ICD-10-CM

## 2024-01-19 DIAGNOSIS — R2681 Unsteadiness on feet: Secondary | ICD-10-CM

## 2024-01-19 NOTE — Therapy (Signed)
 Had front desk fax last PT progress note to pt provided number for her PCP.  Nedra Hai, PT, DPT 01/19/24 3:16 PM

## 2024-01-19 NOTE — Therapy (Signed)
 OUTPATIENT PHYSICAL THERAPY NEURO TREATMENT   Patient Name: Jordan Collier MRN: [id number] DOB:03/27/1946, 78 y.o., female Today's Date: 01/19/2024    PCP: Blenda Mounts REFERRING PROVIDER: Jacalyn Lefevre  END OF SESSION:  PT End of Session - 01/19/24 1430     Visit Number 12    Date for PT Re-Evaluation 02/24/24    Authorization Type BCBS    PT Start Time 1347    PT Stop Time 1426    PT Time Calculation (min) 39 min    Activity Tolerance Patient tolerated treatment well    Behavior During Therapy WFL for tasks assessed/performed                        Past Medical History:  Diagnosis Date   Allergy to mold    Hypercholesterolemia    Psoriasis    Trigger finger, right middle finger    Past Surgical History:  Procedure Laterality Date   TRIGGER FINGER RELEASE Right 01/2023   TUBAL LIGATION N/A 1980   Patient Active Problem List   Diagnosis Date Noted   History of shingles 11/11/2023   Vitamin D insufficiency 11/04/2023   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 Last time I was here, we did a progress note and I need it be faxed to the physician. FMLA is up in June but I want to make sure they have it all.    EVAL: I am okay, a little pooped. My dexterity is off, I don't feel like I have the balance like I used to when I left the hospital.   Pt accompanied by: self  PERTINENT  HISTORY: Jordan Collier is a 78 y.o. female with history of hypercholesterolemia, herpes simplex, cataracts presenting 1/3 with acute onset of right-sided weakness. Patient called EMS after she had trouble getting out of her car due to her right leg weakness. CODE STROKE was activated by EMS.  Denies any hitroy of strokes, headaches, does not take blood thinners. CTH negative. Patient was unable to walk on  assessment, even leaning to right while trying to simply sit up. Management with thrombolytic therapy was explained to the patient, or patient's representative, as were risks, benefits and alternatives. All questions were answered. Patient, or patient's representative, expressed understanding of the treatment plan and agreed to proceed with thrombolytic treatment.  PAIN:  Are you having pain? No 0/10  PRECAUTIONS: Fall  RED FLAGS: None   WEIGHT BEARING RESTRICTIONS: No  FALLS: Has patient fallen in last 6 months? Yes. Number of falls 1 fell in the [facility] driveway Sept 5th  LIVING ENVIRONMENT: Lives with: lives alone Lives in: House/apartment Stairs:  has both an Engineer, structural or can take the stairs, normally she used to take the stairs  Has following equipment at home: Dan Humphreys - 4 wheeled  PLOF: Independent and Independent with basic ADLs  PATIENT GOALS: to be able to drive, go back to work, and go up and down steps   OBJECTIVE:  Note: Objective measures were completed at Evaluation unless otherwise noted.  DIAGNOSTIC FINDINGS:  FINDINGS: Brain: There is a 1.5 cm acute infarct involving the lateral aspect of the left thalamus and adjacent posterior limb of the internal capsule. A single chronic microhemorrhage is noted in the right frontal white matter. T2 hyperintensities in the cerebral white matter bilaterally are nonspecific but compatible with mild-to-moderate chronic small vessel ischemic disease. There is mild generalized cerebral atrophy.   Vascular: Major  intracranial vascular flow voids are preserved.   Skull and upper cervical spine: Unremarkable bone marrow signal.   Sinuses/Orbits: Bilateral cataract extraction. Paranasal sinuses and mastoid air cells are clear.   Other: None.   IMPRESSION: 1. Acute left thalamocapsular infarct. 2. 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 disease  COGNITION: Overall cognitive status: Within functional limits for tasks assessed   SENSATION: WFL  COORDINATION: Decreased coordination   POSTURE: rounded shoulders and forward head  LOWER EXTREMITY ROM:  all WFL    LOWER EXTREMITY MMT:  R hip flexion 3+/5 R knee extension 4-/5; 01/13/24 R hip flexion 4+/5, R quad 4+/5    TRANSFERS: Assistive device utilized: Environmental consultant - 4 wheeled  Sit to stand: CGA Stand to sit: SBA  STAIRS: Level of Assistance: CGA Stair Negotiation Technique: Step to Pattern with Single Rail on Right  GAIT: Gait pattern: step to pattern, decreased stance time- Right, decreased stride length, decreased hip/knee flexion- Right, ataxic, wide BOS, and poor foot clearance- Right Distance walked: in clinic distances Assistive device utilized: Walker - 4 wheeled Level of assistance: SBA   FUNCTIONAL TESTS:  5 times sit to stand: 19.81s; 01/13/24 16.8 seconds  Timed up and go (TUG): 16.25s; most recent 10.6 seconds (date taken unknown) Berg Balance Scale: 45/56; 01/13/24 54/56                                                                                                                              TREATMENT DATE:   01/19/24  Nustep L5x8 minutes all four extremities  Replicated sudden turns carrying a load, sudden stops, light bumps from customers while she is walking  Dual tasking tossing green ball to herself in open and busy areas, S no assist given   Side steps on blue foam pad x5 laps  Tandem gait on blue foam x4 laps  Standing with one foot on BOSU to the side and other foot on blue air pad  2x30 seconds       01/15/24 Walking outdoors big lap Standing on BOSU reaching targets Standing on BOSU catch  Step ups on BOSU forwards and laterally  Walking with direction changes   01/13/24  Re-assessment/objective data for progress note, education on POC and goals met/yet unmet     01/13/24 0001  Berg Balance Test  Sit to Stand 4  Standing Unsupported 4  Sitting with Back Unsupported but Feet Supported on Floor or Stool 4  Stand to Sit 4  Transfers 4  Standing Unsupported with Eyes Closed 4  Standing Unsupported with Feet Together 4  From Standing, Reach Forward with Outstretched Arm 4  From Standing Position, Pick up Object from Floor 4  From Standing Position, Turn to Look Behind Over each Shoulder 4  Turn 360 Degrees 3  Standing Unsupported, Alternately Place Feet on Step/Stool 4  Standing Unsupported, One Foot in Front 4  Standing on One Leg 3  Total Score 54    Scifit bike L5x8 minutes for tissue perfusion, neural priming seat 12  Lateral wt shifts on BOSU  AP wt shifts on BOSU in wide tandem stance  Standing on flat surface of BOSU with ipsilateral reaches to targets up to MinA for balance        PATIENT EDUCATION: Education details: POC, HEP, education about fall risk Person educated: Patient Education method: Explanation Education comprehension: verbalized understanding  HOME EXERCISE PROGRAM: Access Code: [email or web address] URL: [email or web address] Date: 12/02/2023 Prepared by: Cassie Freer  Exercises - Standing Tandem Balance with Counter Support  - 1 x daily - 7 x weekly - 2 sets - 10 reps - 15 hold - Standing Single Leg Stance with Counter Support  - 1 x daily - 7 x weekly - 2 sets - 10 reps - 10 hold - Standing Hip Abduction with Counter Support  - 1 x daily - 7 x weekly - 2 sets - 10 reps - Seated March with Resistance  - 1 x daily - 7 x weekly - 2 sets - 10 reps - Seated Hip Abduction with Resistance  - 1 x daily - 7 x weekly - 2 sets - 10  reps  GOALS: Goals reviewed with patient? Yes  SHORT TERM GOALS: Target date: 01/13/24  Patient will be independent with initial HEP. Baseline: given 12/02/23 Goal status: MET 12/24/23  2.  Patient will demonstrate decreased fall risk by scoring < 14 sec on TUG. Baseline: 16.25s without walker Goal status: MET 10.67s    LONG TERM GOALS: Target date: 02/24/24  Patient will be independent with advanced/ongoing HEP to improve outcomes and carryover.  Baseline:  Goal status: IN PROGRESS 01/13/24  2.  Patient will be able to ambulate 500' with normalized gait pattern and good safety to access community.  Baseline: using rollator and some short distances without, decreased hip flexion on R LE Goal status: IN PROGRESS 01/13/24- gait mechanics do worsen with fatigue   3.  Patient will be able to step up/down a flight of steps to safely to access apartment  Baseline: was using stairs in apt prior  to stroke Goal status: IN PROGRESS 01/13/24   4.  Patient will demonstrate improved functional LE strength as demonstrated by 5xSTS <14s from chair height. Baseline: 19.81s Goal status: IN PROGRESS 01/13/24  5.  Patient will score 53 on Berg Balance test to demonstrate lower risk of falls. (MCID= 8 points) Baseline: 45 Goal status: MET 01/13/24 54  ASSESSMENT:  CLINICAL IMPRESSION:   Pt arrives today doing OK, she is concerned about all her paperwork getting to the MD for FMLA purposes. Will make sure our front desk faxes this today. Focused on balance, her big concern is balance and related to other job specific activities that tend to be spur of the moment. We replicated some of these today, asked her to think of other things she is concerned about at work that we can continue to replicate and practice here.    EVAL: Patient is a 78 y.o. female who was seen today for physical therapy evaluation and treatment for CVA on 10/24/23. Currently she is ambulating with a rollator and doing some walking  without an AD safely around her apartment. She walks with decreased hip flexion and foot clearance on her RLE during swing phase of gait. With functional tests done today she presents as moderate fall risk. Patient will benefit from skilled PT to address her balance, gait, and strength deficits to return to PLOF, be able to drive, and go back to work at [employer].    OBJECTIVE IMPAIRMENTS: Abnormal gait, decreased balance, decreased coordination, decreased endurance, difficulty walking, and decreased strength.   ACTIVITY LIMITATIONS: stairs, transfers, and locomotion level  PARTICIPATION LIMITATIONS: driving, shopping, community activity, and occupation  PERSONAL FACTORS: Transportation are also affecting patient's functional outcome.   REHAB POTENTIAL: Good  CLINICAL DECISION MAKING: Stable/uncomplicated  EVALUATION COMPLEXITY: Low  PLAN:  PT FREQUENCY: 2x/week  PT DURATION: 12 weeks  PLANNED INTERVENTIONS: 97110-Therapeutic exercises, 97530- Therapeutic activity, [id number]- Neuromuscular re-education, 97535- Self Care, 44010- Manual therapy, [phone]- Gait training, Patient/Family education, Balance training, and Stair training  PLAN FOR NEXT SESSION: high level balance, functional activity tolerance, higher level functional strengthening. Likely DC 02/12/24 if not before (progressing extremely well); replicate functional tasks she will need to be able to do for work   Nedra Hai, PT, DPT 01/19/24 2:31 PM

## 2024-01-22 ENCOUNTER — Ambulatory Visit: Admitting: Physical Therapy

## 2024-01-22 ENCOUNTER — Encounter: Payer: Self-pay | Admitting: Physical Therapy

## 2024-01-22 ENCOUNTER — Ambulatory Visit: Attending: Registered Nurse | Admitting: Occupational Therapy

## 2024-01-22 DIAGNOSIS — R278 Other lack of coordination: Secondary | ICD-10-CM

## 2024-01-22 DIAGNOSIS — R2689 Other abnormalities of gait and mobility: Secondary | ICD-10-CM | POA: Diagnosis present

## 2024-01-22 DIAGNOSIS — M6281 Muscle weakness (generalized): Secondary | ICD-10-CM

## 2024-01-22 DIAGNOSIS — R2681 Unsteadiness on feet: Secondary | ICD-10-CM | POA: Insufficient documentation

## 2024-01-22 DIAGNOSIS — R41844 Frontal lobe and executive function deficit: Secondary | ICD-10-CM | POA: Diagnosis present

## 2024-01-22 NOTE — Therapy (Signed)
 OUTPATIENT OCCUPATIONAL THERAPY NEURO Treatment  Patient Name: Jordan Collier MRN: [id number] DOB:05/21/1946, 78 y.o., female Today's Date: 01/22/2024  PCP: Dr. Harrison Mons REFERRING PROVIDER: Jacalyn Lefevre, NP/ Dr. Carlis Abbott  END OF SESSION:  OT End of Session - 01/22/24 1406     Visit Number 8    Number of Visits 25    Date for OT Re-Evaluation 02/24/24    Authorization Type BCBS MCR    Authorization - Visit Number 8    Progress Note Due on Visit 10    OT Start Time 1402    OT Stop Time 1442    OT Time Calculation (min) 40 min    Activity Tolerance Patient tolerated treatment well    Behavior During Therapy WFL for tasks assessed/performed                Past Medical History:  Diagnosis Date   Allergy to mold    Hypercholesterolemia    Psoriasis    Trigger finger, right middle finger    Past Surgical History:  Procedure Laterality Date   TRIGGER FINGER RELEASE Right 01/2023   TUBAL LIGATION N/A 1980   Patient Active Problem List   Diagnosis Date Noted   History of shingles 11/11/2023   Vitamin D insufficiency 11/04/2023   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  TNK administered.  MRI (+) Acute left thalamocapsular infarct. PMH; hyperlipidemia       PRECAUTIONS: Fall  WEIGHT BEARING RESTRICTIONS: No  PAIN:  no   FALLS: Has patient fallen in last 6 months? No  LIVING  ENVIRONMENT: Lives with: lives alone Lives in: House/apartment Stairs: No, has elevator Has following equipment at home:  rollator  PLOF: Independent  PATIENT GOALS: improve function in RUE  OBJECTIVE:  Note: Objective measures were completed at Evaluation unless otherwise noted.  HAND DOMINANCE: LUE  ADLs: Overall ADLs: modified Independent Transfers/ambulation related to ADLs:mod I with rollator  Tub Shower transfers: walk in shower with seat Equipment:  rollator  IADLs:  Meal Prep: has cooked 1 meal however she is able to go dining room/ restaurant at [employer]   MOBILITY STATUS:  mod I with rollator    ACTIVITY TOLERANCE: Activity tolerance: fatigues more quickly   FUNCTIONAL OUTCOME MEASURES: Quick Dash: 36% disability  UPPER EXTREMITY ROM:  WFLS for LUE, grossly WFLs for RUE with exception of internal rotation/ difficulty reaching behind back   Active ROM Right eval Left eval  Shoulder flexion    Shoulder abduction    Shoulder adduction    Shoulder extension    Shoulder internal rotation  grossly 75%  Shoulder external rotation    Elbow flexion    Elbow extension    Wrist flexion    Wrist extension    Wrist ulnar deviation    Wrist radial deviation    Wrist pronation    Wrist supination    (Blank rows = not tested)  UPPER EXTREMITY MMT:  MMT Right eval Left eval  Shoulder flexion 3+/5 4+/5  Shoulder abduction    Shoulder adduction    Shoulder extension    Shoulder internal rotation    Shoulder external rotation    Middle trapezius    Lower trapezius    Elbow flexion 4/5 4+/5  Elbow extension 4/5 4+/5  Wrist flexion    Wrist extension    Wrist ulnar deviation    Wrist radial deviation    Wrist pronation    Wrist supination    (Blank rows = not tested)  HAND FUNCTION: Grip strength: Right: 30 lbs; Left: 45 lbs  COORDINATION: 9 Hole Peg test: Right: 35.17 sec; Left: 28.32 sec, Box/ blocks: RUE: 45 blocks, LUE 55  blocks  SENSATION: WFL    COGNITION: Overall cognitive status:  delayed processing per pt report. Pt spells"WORLD" backwards correctly, pt was able to correctly perfrom clock drawing task   VISION ASSESSMENT: Not tested- denies visual changes from CVA      OBSERVATIONS: 78 y.o female is highly motivated to improve so she can return to work                                                                                                                             TREATMENT DATE: UBE x 6 mins level 3 for conditioning Strengthening exercises for bilateral UE's,  20 reps for shoulder flexion (3 lbs), chest press (2lbs)and abduction with 2 lbs biceps curls with 3 lbs weight,with weight in each hand,  min v.c Triceps kickbacks 2 sets of 10 reps with 2 lbs weight in left and right hand alternately, min v.c and facilitation Placing grooved pegs into pegboard with RUE for increased fine motor coordination, min difficulty,  then removing with tweezers for sustained pinch,  Min difficulty, v.c ambulating while tossing medium ball in hands for category generation, min questioning cues and drops.   01/15/24- UBE x 6 mins level 3 9 hole peg test: RUE 32.10 Strengthening exercises for bilateral UE's, 2 sets of 10 reps for shoulder flexion, chest press and abduction with 2 lbs weight in each hand, biceps curls with 3 lbs weight, min v.c Simulated work activity lifting a 5 lbs weight to pass from one hand to the next to simulate scanning grocery items, min v.c Copying small peg design on vertical eye level surface with RUE for functional reach and fine motor coordination, min drops, min v.c.  01/13/24- UBE x 6 mins for conditioning Simulated work activities lifting and passing a  5 lbs weight from right to left hand as if scanning items then back simulated picking up 5 lbs from floor to chair, and counter top, no LOB. Stacking blocks with left and right UE's simultaneously, min v.c Gripper set at  level 2 for sustained grip with RUE, min difficulty/ drops Fine motor coordination activities with RUE for improved coordination, min difficulty/ v.c  12/31/23 UBE x 6 mins for conditioning Upgraded HEP to red  band 15 reps each, for shoulder abduction, biceps curls, triceps ext, shoulder flexion and shoulder extension, min v.c and demonstration to initate, then pt returned demonstration Copying small peg design on vertical surface for functional reaching just at eye level and fine motor coordination with a cognitive component, min-mod drops, Pt copied design with several errors, pt corrected with min v.c  12/24/23- UBE x 6 mins level 3 for conditioning. Therapist upgraded putty to red and reveiwed HEP. Placing and removing grooved pegs from pegboard withh RUE for improved fine motor coordiantion, min v.c  Today her grip is 40 lbs for left and right UE's 3# Weighted cane for shoulder flexion and biceps curls 15 reps each min v.c  Pt was provided with an exercise flow sheet and therapist made recommendations regarding frequency so that pt is not overwhelmed. Pt verbalized understanding.  12/22/23-Reviewed yellow theraband HEP, 10-15 reps each for bilateral UE's mod v.c and demonstration. Ambulating while tossing ball between hands, then performing category generation for naming food for each letter of the alphabet, min-mod v.c for generating foods, min v.c for balance  discussion with pt about how divided/ alternating attention would impact driving and therapist recommends additional work in this area prior to driving as pt demonstrated instability at times and required increased v.c for category generation Copying small peg design with RUE for coordination with a visual component, pt was able to hold several pegs in her hand to manipulate while placing in the peg board, min difficulty.   12/19/23:  Pt instructed in coordination HEP and yellow theraband HEP--see pt education section.  12/02/23- eval  completed , see education      PATIENT EDUCATION: Education details:see above Person educated: Patient Education method: Explanation, Demonstration, Verbal cues, handout Education comprehension: verbalized understanding, returned demonstration  HOME EXERCISE PROGRAM: Previously was issued putty HEP 12/18/22:  Coordination HEP, Yellow theraband HEP   GOALS: Goals reviewed with patient? Yes  SHORT TERM GOALS: Target date: 12/29/23  I with inital HEP  Goal status: met, 12/24/23  2.  Pt will demonstrate improved RUE functional use for ADLs as evidenced by increasing box/ blocks score by 4 blocks.  Goal status: ongoing, 12/22/23  3.   Pt. will demonstrate ability to perform a physical and cognitive task simultaneously  with 90% or better accuracy in prep for return to work and driving.  Goal status: ongoing, good performace of category genration, however pt became more unsteday with several drops of ball she was tossing, 80-90%  4.  Pt will increase RUE grip strength by 5 lbs for increased RUE functional use.  Goal status: met, 12/24/23  5.  Pt will report increased ease with separating paper/ pages with RUE.  Goal status: met, at pre morbid level  6.  Pt will bathe her back modified indpendently with RUE.  Goal status: met, per pt report, pt demonstrates ability to perform 12/24/23  LONG TERM GOALS: Target date: 02/24/24  I with updated HEP.  Goal status: ongoing  2.  Pt will demonstrate improved fine motor coordination for ADLs as evidenced by decreasing RUE 9 hole peg test score by 3 secs.  Goal status: met 32.10 secs 01/15/24  3.  Pt will perform simulated work activities modified independently.  Goal status: ongoing 01/22/24  4.  Pt will report increased ease and accuracy with typing.  Goal status: ongoing  5.  Pt will demonstrate ability to retrieve 3 lbs from overhead shelf with good control.  Goal status: ongoing, 01/22/24  ASSESSMENT:  CLINICAL  IMPRESSION: Pt is progressing towards goals.She demonstrates improving RUE strength and control. PERFORMANCE DEFICITS: in functional skills including ADLs, IADLs, coordination, dexterity, ROM, strength, pain, flexibility, Fine motor control, Gross motor control, mobility, balance, endurance, decreased knowledge of precautions, decreased knowledge of use of DME, and UE functional use, cognitive skills including attention, safety awareness, and thought, and psychosocial skills including coping strategies, environmental adaptation, habits, interpersonal interactions, and routines and behaviors.   IMPAIRMENTS: are limiting patient from ADLs, IADLs, work, play, leisure, and social participation.   CO-MORBIDITIES: may have co-morbidities  that affects occupational performance. Patient will benefit from skilled OT to address above impairments and improve overall function.  MODIFICATION OR ASSISTANCE TO COMPLETE EVALUATION: No modification of tasks or assist necessary to complete an evaluation.  OT OCCUPATIONAL PROFILE AND HISTORY: Detailed assessment: Review of records and additional review of physical, cognitive, psychosocial history related to current functional performance.  CLINICAL DECISION MAKING: LOW - limited treatment options, no task modification necessary  REHAB POTENTIAL: Good  EVALUATION COMPLEXITY: Low    PLAN:  OT FREQUENCY: 2x/week  OT DURATION: 12 weeks, will likely d/c after 6-8 weeks dependent on progress.  PLANNED INTERVENTIONS: 97168 OT Re-evaluation, 97535 self care/ADL training, 62952 therapeutic exercise, 97530 therapeutic activity, 97112 neuromuscular re-education, 97140 manual therapy, 97116 gait training, 84132 aquatic therapy, 97035 ultrasound, 97018 paraffin, 44010 moist heat, 97010 cryotherapy, 97129 Cognitive training (first 15 min), 27253 Cognitive training(each additional 15 min), passive range of motion, balance training, stair training, functional mobility  training, psychosocial skills training, energy conservation, coping strategies training, patient/family education, and DME and/or AE instructions  RECOMMENDED OTHER SERVICES: PT  CONSULTED AND AGREED WITH PLAN OF CARE: Patient  PLAN FOR NEXT SESSION:  strength and coordination, work tasks pt is highly motivated to improve and to return to work at [employer], OTR/L 01/22/2024, 2:07 PM

## 2024-01-22 NOTE — Therapy (Signed)
 OUTPATIENT PHYSICAL THERAPY NEURO TREATMENT   Patient Name: Jordan Collier MRN: [id number] DOB:19-Apr-1946, 78 y.o., female Today's Date: 01/22/2024    PCP: Blenda Mounts REFERRING PROVIDER: Jacalyn Lefevre  END OF SESSION:  PT End of Session - 01/22/24 1526     Visit Number 13    Date for PT Re-Evaluation 02/24/24    Authorization Type BCBS    PT Start Time 1517    PT Stop Time 1555    PT Time Calculation (min) 38 min    Activity Tolerance Patient tolerated treatment well    Behavior During Therapy WFL for tasks assessed/performed                         Past Medical History:  Diagnosis Date   Allergy to mold    Hypercholesterolemia    Psoriasis    Trigger finger, right middle finger    Past Surgical History:  Procedure Laterality Date   TRIGGER FINGER RELEASE Right 01/2023   TUBAL LIGATION N/A 1980   Patient Active Problem List   Diagnosis Date Noted   History of shingles 11/11/2023   Vitamin D insufficiency 11/04/2023   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   Hyperlipemia 10/27/2023   Stroke determined by clinical assessment (HCC) 10/24/2023    ONSET DATE: 10/24/23  REFERRING DIAG:  I63.81 (ICD-10-CM) - Thalamic stroke (HCC)    THERAPY DIAG:  Muscle weakness (generalized)  Other lack of coordination  Unsteadiness on feet  Rationale for Evaluation and Treatment: Rehabilitation  SUBJECTIVE:                                                                                                                                                                                             SUBJECTIVE STATEMENT:  Doing well, forgot to brainstorm about challenging tasks. Still worried about my balance. Realized I was using shoes with a rock to them where I live, usually I keep my feet stiff but these. Was sore from a yoga class this week.    EVAL: I am okay, a little pooped. My dexterity is off, I don't feel like I have the balance like I used to when I  left the hospital.   Pt accompanied by: self  PERTINENT HISTORY: [name] is a 78 y.o. female with history of hypercholesterolemia, herpes simplex, cataracts presenting 1/3 with acute onset of right-sided weakness. Patient called EMS after she had trouble getting out of her car due to her right leg weakness. CODE STROKE was activated by EMS.  Denies any hitroy of strokes, headaches, does not take blood thinners. CTH negative. Patient  was unable to walk on assessment, even leaning to right while trying to simply sit up. Management with thrombolytic therapy was explained to the patient, or patient's representative, as were risks, benefits and alternatives. All questions were answered. Patient, or patient's representative, expressed understanding of the treatment plan and agreed to proceed with thrombolytic treatment.  PAIN:  Are you having pain? No 0/10 now   PRECAUTIONS: Fall  RED FLAGS: None   WEIGHT BEARING RESTRICTIONS: No  FALLS: Has patient fallen in last 6 months? Yes. Number of falls 1 fell in the [facility] driveway Sept 5th  LIVING ENVIRONMENT: Lives with: lives alone Lives in: House/apartment Stairs:  has both an Engineer, structural or can take the stairs, normally she used to take the stairs  Has following equipment at home: Dan Humphreys - 4 wheeled  PLOF: Independent and Independent with basic ADLs  PATIENT GOALS: to be able to drive, go back to work, and go up and down steps   OBJECTIVE:  Note: Objective measures were completed at Evaluation unless otherwise noted.  DIAGNOSTIC FINDINGS:  FINDINGS: Brain: There is a 1.5 cm acute infarct involving the lateral aspect of the left thalamus and adjacent posterior limb of the internal capsule. A single chronic microhemorrhage is noted in the right frontal white matter. T2 hyperintensities in the cerebral white matter bilaterally are nonspecific but compatible with mild-to-moderate chronic small vessel ischemic disease. There  is mild generalized cerebral atrophy.   Vascular: Major intracranial vascular flow voids are preserved.   Skull and upper cervical spine: Unremarkable bone marrow signal.   Sinuses/Orbits: Bilateral cataract extraction. Paranasal sinuses and mastoid air cells are clear.   Other: None.   IMPRESSION: 1. Acute left thalamocapsular infarct. 2. 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 disease  COGNITION: Overall cognitive status: Within functional limits for tasks assessed   SENSATION: WFL  COORDINATION: Decreased coordination   POSTURE: rounded shoulders and forward head  LOWER EXTREMITY ROM:  all WFL    LOWER EXTREMITY MMT:  R hip flexion 3+/5 R knee extension 4-/5; 01/13/24 R hip flexion 4+/5, R quad 4+/5    TRANSFERS: Assistive device utilized: Environmental consultant - 4 wheeled  Sit to stand: CGA Stand to sit: SBA  STAIRS: Level of Assistance: CGA Stair Negotiation Technique: Step to Pattern with Single Rail on Right  GAIT: Gait pattern: step to pattern, decreased stance time- Right, decreased stride length, decreased hip/knee flexion- Right, ataxic, wide BOS, and poor foot clearance- Right Distance walked: in clinic distances Assistive device utilized: Walker - 4 wheeled Level of assistance: SBA   FUNCTIONAL TESTS:  5 times sit to stand: 19.81s; 01/13/24 16.8 seconds  Timed up and go (TUG): 16.25s; most recent 10.6 seconds (date taken unknown) Berg Balance Scale: 45/56; 01/13/24 54/56                                                                                                                              TREATMENT DATE:   01/22/24  Nustep L5x8 minutes all four extremities for neural priming, w/u   Step ups onto BOSU + 4 way step overs with full and half foam rolls + forward and backward figure 8s (2 rounds) Forward lunges onto BOSU + dribbling blue weighted ball between cones + forward/lateral ball rolls (2 rounds) Lateral stepping with alternating cone taps +  backwards walking with heads turns (3 rounds)     01/19/24  Nustep L5x8 minutes all four extremities Replicated sudden turns carrying a load, sudden stops, light bumps from customers while she is walking  Dual tasking tossing green ball to herself in open and busy areas, S no assist given   Side steps on blue foam pad x5 laps  Tandem gait on blue foam x4 laps  Standing with one foot on BOSU to the side and other foot on blue air pad  2x30 seconds      01/15/24 Walking outdoors big lap Standing on BOSU reaching targets Standing on BOSU catch  Step ups on BOSU forwards and laterally  Walking with direction changes   01/13/24  Re-assessment/objective data for progress note, education on POC and goals met/yet unmet     01/13/24 0001  Berg Balance Test  Sit to Stand 4  Standing Unsupported 4  Sitting with Back Unsupported but Feet Supported on Floor or Stool 4  Stand to Sit 4  Transfers 4  Standing Unsupported with Eyes Closed 4  Standing Unsupported with Feet Together 4  From Standing, Reach Forward with Outstretched Arm 4  From Standing Position, Pick up Object from Floor 4  From Standing Position, Turn to Look Behind Over each Shoulder 4  Turn 360 Degrees 3  Standing Unsupported, Alternately Place Feet on Step/Stool 4  Standing Unsupported, One Foot in Front 4  Standing on One Leg 3  Total Score 54    Scifit bike L5x8 minutes for tissue perfusion, neural priming seat 12  Lateral wt shifts on BOSU  AP wt shifts on BOSU in wide tandem stance  Standing on flat surface of BOSU with ipsilateral reaches to targets up to MinA for balance        PATIENT EDUCATION: Education details: POC, HEP, education about fall risk Person educated: Patient Education method: Explanation Education comprehension: verbalized understanding  HOME EXERCISE PROGRAM: Access Code: [email or web address] URL: [email or web address] Date: 12/02/2023 Prepared by: Cassie Freer  Exercises - Standing Tandem Balance with Counter Support  - 1 x daily - 7 x weekly - 2 sets - 10 reps - 15 hold - Standing Single Leg Stance with Counter Support  - 1 x daily - 7 x weekly - 2 sets - 10 reps - 10 hold - Standing Hip Abduction with Counter Support  - 1 x daily - 7 x weekly - 2 sets - 10 reps - Seated March with Resistance  - 1 x daily - 7 x weekly - 2 sets - 10 reps - Seated Hip Abduction with Resistance  - 1 x daily - 7 x weekly - 2 sets - 10 reps  GOALS: Goals reviewed with patient? Yes  SHORT TERM GOALS: Target date: 01/13/24  Patient will be independent with initial HEP. Baseline: given 12/02/23 Goal status: MET 12/24/23  2.  Patient will demonstrate decreased fall risk by scoring < 14 sec on TUG. Baseline: 16.25s without walker Goal status: MET 10.67s    LONG TERM GOALS: Target date: 02/24/24  Patient will be independent with advanced/ongoing HEP to improve outcomes and carryover.  Baseline:  Goal  status: IN PROGRESS 01/13/24  2.  Patient will be able to ambulate 500' with normalized gait pattern and good safety to access community.  Baseline: using rollator and some short distances without, decreased hip flexion on R LE Goal status: IN PROGRESS 01/13/24- gait mechanics do worsen with fatigue   3.  Patient will be able to step up/down a flight of steps to safely to access apartment  Baseline: was using stairs in apt prior to stroke Goal status: IN PROGRESS 01/13/24   4.  Patient will demonstrate improved functional LE strength as demonstrated by 5xSTS <14s from chair height. Baseline: 19.81s Goal status: IN PROGRESS 01/13/24  5.  Patient will score 53 on Berg Balance test to demonstrate lower risk of falls. (MCID= 8 points) Baseline: 45 Goal status: MET 01/13/24 54  ASSESSMENT:  CLINICAL IMPRESSION:   Pt arrives today doing well, continues to be concerned about her balance. From PT standpoint she is really doing well, continued to challenge  balance but she can realistically safely DC from PT within the next couple of sessions. She has done extremely well with replicated functional tasks and activities that she will need to perform for work.  EVAL: Patient is a 78 y.o. female who was seen today for physical therapy evaluation and treatment for CVA on 10/24/23. Currently she is ambulating with a rollator and doing some walking without an AD safely around her apartment. She walks with decreased hip flexion and foot clearance on her RLE during swing phase of gait. With functional tests done today she presents as moderate fall risk. Patient will benefit from skilled PT to address her balance, gait, and strength deficits to return to PLOF, be able to drive, and go back to work at [employer].    OBJECTIVE IMPAIRMENTS: Abnormal gait, decreased balance, decreased coordination, decreased endurance, difficulty walking, and decreased strength.   ACTIVITY LIMITATIONS: stairs, transfers, and locomotion level  PARTICIPATION LIMITATIONS: driving, shopping, community activity, and occupation  PERSONAL FACTORS: Transportation are also affecting patient's functional outcome.   REHAB POTENTIAL: Good  CLINICAL DECISION MAKING: Stable/uncomplicated  EVALUATION COMPLEXITY: Low  PLAN:  PT FREQUENCY: 2x/week  PT DURATION: 12 weeks  PLANNED INTERVENTIONS: 97110-Therapeutic exercises, 97530- Therapeutic activity, [id number]- Neuromuscular re-education, 97535- Self Care, 29562- Manual therapy, [phone]- Gait training, Patient/Family education, Balance training, and Stair training  PLAN FOR NEXT SESSION: high level balance, functional activity tolerance, higher level functional strengthening. DC on 4/9, update HEP as appropriate   Nedra Hai, PT, DPT 01/22/24 3:57 PM

## 2024-01-26 ENCOUNTER — Ambulatory Visit: Admitting: Physical Therapy

## 2024-01-26 ENCOUNTER — Encounter: Payer: Self-pay | Admitting: Physical Therapy

## 2024-01-26 ENCOUNTER — Ambulatory Visit: Admitting: Occupational Therapy

## 2024-01-26 DIAGNOSIS — R278 Other lack of coordination: Secondary | ICD-10-CM

## 2024-01-26 DIAGNOSIS — R2681 Unsteadiness on feet: Secondary | ICD-10-CM

## 2024-01-26 DIAGNOSIS — M6281 Muscle weakness (generalized): Secondary | ICD-10-CM

## 2024-01-26 DIAGNOSIS — R2689 Other abnormalities of gait and mobility: Secondary | ICD-10-CM

## 2024-01-26 DIAGNOSIS — R41844 Frontal lobe and executive function deficit: Secondary | ICD-10-CM

## 2024-01-26 NOTE — Therapy (Signed)
 OUTPATIENT OCCUPATIONAL THERAPY NEURO Treatment  Patient Name: Jordan Collier MRN: [id number] DOB:11-07-1945, 78 y.o., female Today's Date: 01/26/2024  PCP: Dr. Harrison Mons REFERRING PROVIDER: Jacalyn Lefevre, NP/ Dr. Carlis Abbott OCCUPATIONAL THERAPY DISCHARGE SUMMARY    Current functional level related to goals / functional outcomes: Pt made good overall progress. she achieved all long term goals. See below.   Remaining deficits: mildly decreased RUE strength, coordination, decreased endurance, mildly decreased alternating attention.   Education / Equipment: Pt was instructed in HEP, she returns demonstration. Therapist recommends that pt focuses on 1 task at a time when she returns to work(Pt continues to demonstrate mild deficits in alteranting attention). Pt verbalizes understanding.   Patient agrees to discharge. Patient goals were met. Patient is being discharged due to being pleased with the current functional level..    END OF SESSION:  OT End of Session - 01/26/24 1232     Visit Number 9    Number of Visits 25    Date for OT Re-Evaluation 02/24/24    Authorization Type BCBS MCR    Authorization - Visit Number 9    Progress Note Due on Visit 10    OT Start Time 1231    OT Stop Time 1313    OT Time Calculation (min) 42 min    Activity Tolerance Patient tolerated treatment well    Behavior During Therapy WFL for tasks assessed/performed                Past Medical History:  Diagnosis Date   Allergy to mold    Hypercholesterolemia    Psoriasis    Trigger finger, right middle finger    Past Surgical History:  Procedure Laterality Date   TRIGGER FINGER RELEASE Right 01/2023   TUBAL LIGATION N/A 1980   Patient Active Problem List   Diagnosis Date Noted   History of shingles 11/11/2023   Vitamin D insufficiency 11/04/2023   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  Rationale for Evaluation and Treatment: Rehabilitation  SUBJECTIVE:   SUBJECTIVE STATEMENT: Pt reports her exercises  mak her a little sore Pt accompanied by: self  PERTINENT HISTORY: Pt is a 78 y/o female presenting 1/3 with sudden onset of R sided weakness.  TNK administered.  MRI (+) Acute left thalamocapsular infarct. PMH; hyperlipidemia       PRECAUTIONS: Fall  WEIGHT BEARING RESTRICTIONS: No  PAIN:  no pain at rest soreness only after exercise   FALLS: Has patient fallen in last 6 months? No  LIVING ENVIRONMENT: Lives with: lives alone Lives in: House/apartment Stairs: No, has elevator Has following equipment at home:  rollator  PLOF: Independent  PATIENT GOALS: improve function in RUE  OBJECTIVE:  Note: Objective measures were completed at Evaluation unless otherwise noted.  HAND DOMINANCE: LUE  ADLs: Overall ADLs: modified Independent Transfers/ambulation related to ADLs:mod I with rollator  Tub Shower transfers: walk in shower with seat Equipment:  rollator  IADLs:  Meal Prep: has cooked 1 meal however she is able to go dining room/ restaurant at [employer]   MOBILITY STATUS:  mod I with rollator    ACTIVITY TOLERANCE: Activity tolerance: fatigues more quickly   FUNCTIONAL OUTCOME MEASURES: Quick Dash: 36% disability  UPPER EXTREMITY ROM:  WFLS for LUE, grossly WFLs for RUE  with exception of internal rotation/ difficulty reaching behind back   Active ROM Right eval Left eval  Shoulder flexion    Shoulder abduction    Shoulder adduction    Shoulder extension    Shoulder internal rotation  grossly 75%  Shoulder external rotation    Elbow flexion    Elbow extension    Wrist flexion    Wrist extension    Wrist ulnar deviation    Wrist radial deviation    Wrist pronation    Wrist supination    (Blank rows =  not tested)  UPPER EXTREMITY MMT:     MMT Right eval Left eval  Shoulder flexion 3+/5 4+/5  Shoulder abduction    Shoulder adduction    Shoulder extension    Shoulder internal rotation    Shoulder external rotation    Middle trapezius    Lower trapezius    Elbow flexion 4/5 4+/5  Elbow extension 4/5 4+/5  Wrist flexion    Wrist extension    Wrist ulnar deviation    Wrist radial deviation    Wrist pronation    Wrist supination    (Blank rows = not tested)  HAND FUNCTION: Grip strength: Right: 30 lbs; Left: 45 lbs  COORDINATION: 9 Hole Peg test: Right: 35.17 sec; Left: 28.32 sec, Box/ blocks: RUE: 45 blocks, LUE 55 blocks  SENSATION: WFL    COGNITION: Overall cognitive status:  delayed processing per pt report. Pt spells"WORLD" backwards correctly, pt was able to correctly perfrom clock drawing task   VISION ASSESSMENT: Not tested- denies visual changes from CVA      OBSERVATIONS: 78 y.o female is highly motivated to improve so she can return to work                                                                                                                             TREATMENT DATE: 01/26/24-ambulating while tossing medium ball in hands for category generation, min questioning cues and drops >90% accuracy Therapist recommends that pt focuses on 1 task at a time when she returns to work for improved accuracy. Therapist checked goals as pt requests d/c today instead of Wednesday. She desires to return to work soon. Standing to copy small peg design with RUE for increased fine motor coordiantion and in hand manipulation, min difficulty/ drops. UBE x 6 mins levle 3 for conditioning  01/22/24 UBE x 6 mins level 3 for conditioning Strengthening exercises for bilateral UE's,  20 reps for shoulder flexion (3 lbs), chest press (2lbs)and abduction with 2 lbs biceps curls with 3 lbs weight,with weight in each hand,  min v.c Triceps kickbacks 2 sets of 10 reps with 2  lbs weight in left and right hand alternately, min v.c and facilitation Placing grooved pegs into pegboard with RUE for increased fine motor coordination, min difficulty,  then removing with tweezers for sustained pinch,  Min difficulty, v.c ambulating while tossing medium ball in hands for  category generation, min questioning cues and drops.   01/15/24- UBE x 6 mins level 3 9 hole peg test: RUE 32.10 Strengthening exercises for bilateral UE's, 2 sets of 10 reps for shoulder flexion, chest press and abduction with 2 lbs weight in each hand, biceps curls with 3 lbs weight, min v.c Simulated work activity lifting a 5 lbs weight to pass from one hand to the next to simulate scanning grocery items, min v.c Copying small peg design on vertical eye level surface with RUE for functional reach and fine motor coordination, min drops, min v.c.  01/13/24- UBE x 6 mins for conditioning Simulated work activities lifting and passing a  5 lbs weight from right to left hand as if scanning items then back simulated picking up 5 lbs from floor to chair, and counter top, no LOB. Stacking blocks with left and right UE's simultaneously, min v.c Gripper set at level 2 for sustained grip with RUE, min difficulty/ drops Fine motor coordination activities with RUE for improved coordination, min difficulty/ v.c  12/31/23 UBE x 6 mins for conditioning Upgraded HEP to red band 15 reps each, for shoulder abduction, biceps curls, triceps ext, shoulder flexion and shoulder extension, min v.c and demonstration to initate, then pt returned demonstration Copying small peg design on vertical surface for functional reaching just at eye level and fine motor coordination with a cognitive component, min-mod drops, Pt copied design with several errors, pt corrected with min v.c  12/24/23- UBE x 6 mins level 3 for conditioning. Therapist upgraded putty to red and reveiwed HEP. Placing and removing grooved pegs from pegboard withh RUE  for improved fine motor coordiantion, min v.c  Today her grip is 40 lbs for left and right UE's 3# Weighted cane for shoulder flexion and biceps curls 15 reps each min v.c  Pt was provided with an exercise flow sheet and therapist made recommendations regarding frequency so that pt is not overwhelmed. Pt verbalized understanding.  12/22/23-Reviewed yellow theraband HEP, 10-15 reps each for bilateral UE's mod v.c and demonstration. Ambulating while tossing ball between hands, then performing category generation for naming food for each letter of the alphabet, min-mod v.c for generating foods, min v.c for balance  discussion with pt about how divided/ alternating attention would impact driving and therapist recommends additional work in this area prior to driving as pt demonstrated instability at times and required increased v.c for category generation Copying small peg design with RUE for coordination with a visual component, pt was able to hold several pegs in her hand to manipulate while placing in the peg board, min difficulty.   12/19/23:  Pt instructed in coordination HEP and yellow theraband HEP--see pt education section.  12/02/23- eval completed , see education      PATIENT EDUCATION: Education details:progress towards goals and plans for d/c  Person educated: Patient Education method: Explanation,  Education comprehension: verbalized understanding,   HOME EXERCISE PROGRAM: Previously was issued putty HEP 12/18/22:  Coordination HEP, Yellow theraband HEP   GOALS: Goals reviewed with patient? Yes  SHORT TERM GOALS: Target date: 12/29/23  I with inital HEP  Goal status: met, 12/24/23  2.  Pt will demonstrate improved RUE functional use for ADLs as evidenced by increasing box/ blocks score by 4 blocks.  Goal status: partially met, 44 blocks first trial, 50 blocks second trial(met second trial) 01/26/24  3.   Pt. will demonstrate ability to perform a physical and cognitive task  simultaneously  with 90% or better accuracy  in prep for return to work and driving.  Goal status: met, grossly 90% 01/26/24  4.  Pt will increase RUE grip strength by 5 lbs for increased RUE functional use.  Goal status: met, 12/24/23  5.  Pt will report increased ease with separating paper/ pages with RUE.  Goal status: met, at pre morbid level  6.  Pt will bathe her back modified indpendently with RUE.  Goal status: met, per pt report, pt demonstrates ability to perform 12/24/23  LONG TERM GOALS: Target date: 02/24/24  I with updated HEP.  Goal status: met, 01/26/24  2.  Pt will demonstrate improved fine motor coordination for ADLs as evidenced by decreasing RUE 9 hole peg test score by 3 secs.  Goal status: met 32.10 secs 01/15/24  3.  Pt will perform simulated work activities modified independently.  Goal status: met, simulated scanning items and overhead retrieval 01/26/24,   4.  Pt will report increased ease and accuracy with typing.  Goal status: met, 01/26/23  5.  Pt will demonstrate ability to retrieve 3 lbs from overhead shelf with good control.  Goal status: met, 01/26/24   ASSESSMENT:  CLINICAL IMPRESSION: Pt has made good overall progress. She requests d/c today as she is doing well and she wants to put what she has learned into practice. PERFORMANCE DEFICITS: in functional skills including ADLs, IADLs, coordination, dexterity, ROM, strength, pain, flexibility, Fine motor control, Gross motor control, mobility, balance, endurance, decreased knowledge of precautions, decreased knowledge of use of DME, and UE functional use, cognitive skills including attention, safety awareness, and thought, and psychosocial skills including coping strategies, environmental adaptation, habits, interpersonal interactions, and routines and behaviors.   IMPAIRMENTS: are limiting patient from ADLs, IADLs, work, play, leisure, and social participation.   CO-MORBIDITIES: may have co-morbidities   that affects occupational performance. Patient will benefit from skilled OT to address above impairments and improve overall function.  MODIFICATION OR ASSISTANCE TO COMPLETE EVALUATION: No modification of tasks or assist necessary to complete an evaluation.  OT OCCUPATIONAL PROFILE AND HISTORY: Detailed assessment: Review of records and additional review of physical, cognitive, psychosocial history related to current functional performance.  CLINICAL DECISION MAKING: LOW - limited treatment options, no task modification necessary  REHAB POTENTIAL: Good  EVALUATION COMPLEXITY: Low    PLAN:  OT FREQUENCY: 2x/week  OT DURATION: 12 weeks, will likely d/c after 6-8 weeks dependent on progress.  PLANNED INTERVENTIONS: 97168 OT Re-evaluation, 97535 self care/ADL training, 16109 therapeutic exercise, 97530 therapeutic activity, 97112 neuromuscular re-education, 97140 manual therapy, 97116 gait training, 60454 aquatic therapy, 97035 ultrasound, 97018 paraffin, 09811 moist heat, 97010 cryotherapy, 97129 Cognitive training (first 15 min), 91478 Cognitive training(each additional 15 min), passive range of motion, balance training, stair training, functional mobility training, psychosocial skills training, energy conservation, coping strategies training, patient/family education, and DME and/or AE instructions  RECOMMENDED OTHER SERVICES: PT  CONSULTED AND AGREED WITH PLAN OF CARE: Patient  PLAN FOR NEXT SESSION:  d/c OT Gail Creekmore, OTR/L 01/26/2024, 12:33 PM

## 2024-01-26 NOTE — Therapy (Signed)
 OUTPATIENT PHYSICAL THERAPY NEURO TREATMENT/DISCHARGE      Patient Name: Jordan Collier MRN: [id number] DOB:07-Mar-1946, 78 y.o., female Today's Date: 01/26/2024   PHYSICAL THERAPY DISCHARGE SUMMARY  Visits from Start of Care: 14  Current functional level related to goals / functional outcomes: See below    Remaining deficits: See below    Education / Equipment: See below    Patient agrees to discharge. Patient goals were met. Patient is being discharged due to meeting the stated rehab goals.   PCP: Blenda Mounts REFERRING PROVIDER: Jacalyn Lefevre  END OF SESSION:  PT End of Session - 01/26/24 1349     Visit Number 14    Authorization Type BCBS    PT Start Time 1347    PT Stop Time 1425    PT Time Calculation (min) 38 min    Activity Tolerance Patient tolerated treatment well    Behavior During Therapy WFL for tasks assessed/performed                          Past Medical History:  Diagnosis Date   Allergy to mold    Hypercholesterolemia    Psoriasis    Trigger finger, right middle finger    Past Surgical History:  Procedure Laterality Date   TRIGGER FINGER RELEASE Right 01/2023   TUBAL LIGATION N/A 1980   Patient Active Problem List   Diagnosis Date Noted   History of shingles 11/11/2023   Vitamin D insufficiency 11/04/2023   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   Hyperlipemia 10/27/2023   Stroke determined by clinical assessment (HCC) 10/24/2023    ONSET DATE: 10/24/23  REFERRING DIAG:  I63.81 (ICD-10-CM) - Thalamic stroke (HCC)    THERAPY DIAG:  Muscle weakness (generalized)  Other lack of coordination  Unsteadiness on feet  Rationale for Evaluation and Treatment: Rehabilitation  SUBJECTIVE:                                                                                                                                                                                             SUBJECTIVE STATEMENT:  I'd actually like to  make today my last day, I want to make today my last day since we were wrapping up anyway.   EVAL: I am okay, a little pooped. My dexterity is off, I don't feel like I have the balance like I used to when I left the hospital.   Pt accompanied by: self  PERTINENT HISTORY: [name] is a 78 y.o. female with history of hypercholesterolemia, herpes simplex, cataracts presenting 1/3 with acute onset of right-sided weakness. Patient called  EMS after she had trouble getting out of her car due to her right leg weakness. CODE STROKE was activated by EMS.  Denies any hitroy of strokes, headaches, does not take blood thinners. CTH negative. Patient was unable to walk on assessment, even leaning to right while trying to simply sit up. Management with thrombolytic therapy was explained to the patient, or patient's representative, as were risks, benefits and alternatives. All questions were answered. Patient, or patient's representative, expressed understanding of the treatment plan and agreed to proceed with thrombolytic treatment.  PAIN:  Are you having pain? No "only discomfort after I do exercises, feel sore the next day after exercises"   PRECAUTIONS: Fall  RED FLAGS: None   WEIGHT BEARING RESTRICTIONS: No  FALLS: Has patient fallen in last 6 months? Yes. Number of falls 1 fell in the [facility] driveway Sept 5th  LIVING ENVIRONMENT: Lives with: lives alone Lives in: House/apartment Stairs:  has both an Engineer, structural or can take the stairs, normally she used to take the stairs  Has following equipment at home: Dan Humphreys - 4 wheeled  PLOF: Independent and Independent with basic ADLs  PATIENT GOALS: to be able to drive, go back to work, and go up and down steps   OBJECTIVE:  Note: Objective measures were completed at Evaluation unless otherwise noted.  DIAGNOSTIC FINDINGS:  FINDINGS: Brain: There is a 1.5 cm acute infarct involving the lateral aspect of the left thalamus and adjacent  posterior limb of the internal capsule. A single chronic microhemorrhage is noted in the right frontal white matter. T2 hyperintensities in the cerebral white matter bilaterally are nonspecific but compatible with mild-to-moderate chronic small vessel ischemic disease. There is mild generalized cerebral atrophy.   Vascular: Major intracranial vascular flow voids are preserved.   Skull and upper cervical spine: Unremarkable bone marrow signal.   Sinuses/Orbits: Bilateral cataract extraction. Paranasal sinuses and mastoid air cells are clear.   Other: None.   IMPRESSION: 1. Acute left thalamocapsular infarct. 2. 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 disease  COGNITION: Overall cognitive status: Within functional limits for tasks assessed   SENSATION: WFL  COORDINATION: Decreased coordination   POSTURE: rounded shoulders and forward head  LOWER EXTREMITY ROM:  all WFL    LOWER EXTREMITY MMT:  R hip flexion 3+/5 R knee extension 4-/5; 01/13/24 R hip flexion 4+/5, R quad 4+/5    TRANSFERS: Assistive device utilized: Environmental consultant - 4 wheeled  Sit to stand: CGA Stand to sit: SBA  STAIRS: Level of Assistance: CGA Stair Negotiation Technique: Step to Pattern with Single Rail on Right  GAIT: Gait pattern: step to pattern, decreased stance time- Right, decreased stride length, decreased hip/knee flexion- Right, ataxic, wide BOS, and poor foot clearance- Right Distance walked: in clinic distances Assistive device utilized: Walker - 4 wheeled Level of assistance: SBA   FUNCTIONAL TESTS:  5 times sit to stand: 19.81s; 01/13/24 16.8 seconds; 01/26/24- 11.8 seconds no UEs    Timed up and go (TUG): 16.25s; most recent 10.6 seconds (date taken unknown) Berg Balance Scale: 45/56; 01/13/24 54/56  TREATMENT DATE:    01/26/24  Education on POC, HEP updates,  DC today, proper way to take blood pressure at home, discussed progressions of work on reclining bike   Nustep L6x8 minutes all four extremities    Tandem walks forward and backward x4 laps  Discussed SLS and tandem stance in corner with eyes closed Tandem stance with head turns x2 rounds with feet each way Backwards walking x2 rounds (about 66ft each)     01/22/24  Nustep L5x8 minutes all four extremities for neural priming, w/u   Step ups onto BOSU + 4 way step overs with full and half foam rolls + forward and backward figure 8s (2 rounds) Forward lunges onto BOSU + dribbling blue weighted ball between cones + forward/lateral ball rolls (2 rounds) Lateral stepping with alternating cone taps + backwards walking with heads turns (3 rounds)           PATIENT EDUCATION: Education details: POC, HEP, education about fall risk Person educated: Patient Education method: Explanation Education comprehension: verbalized understanding  HOME EXERCISE PROGRAM:   Access Code: [email or web address] URL: [email or web address] Date: 01/26/2024 Prepared by: Nedra Hai  Exercises - Standing Tandem Balance with Counter Support  - 1 x daily - 7 x weekly - 2 sets - 10 reps - 15 hold - Standing Single Leg Stance with Counter Support  - 1 x daily - 7 x weekly - 2 sets - 10 reps - 10 hold - Standing Hip Abduction with Counter Support  - 1 x daily - 7 x weekly - 2 sets - 10 reps - Seated March with Resistance  - 1 x daily - 7 x weekly - 2 sets - 10 reps - Seated Hip Abduction with Resistance  - 1 x daily - 7 x weekly - 2 sets - 10 reps - Walking Tandem Stance  - 1 x daily - 7 x weekly - 1 sets - 5 reps - Backward Tandem Walking  - 1 x daily - 7 x weekly - 1 sets - 5 reps - Tandem Stance with Eyes Closed in Corner  - 1 x daily - 7 x weekly - 1 sets - 4 reps - Tandem Stance with Head Rotation  - 1 x daily - 7 x weekly - 1 sets - 4 reps - Backwards Walking  - 1 x daily - 7 x weekly - 1  sets - 5 reps      GOALS: Goals reviewed with patient? Yes  SHORT TERM GOALS: Target date: 01/13/24  Patient will be independent with initial HEP. Baseline: given 12/02/23 Goal status: MET 12/24/23  2.  Patient will demonstrate decreased fall risk by scoring < 14 sec on TUG. Baseline: 16.25s without walker Goal status: MET 10.67s    LONG TERM GOALS: Target date: 02/24/24  Patient will be independent with advanced/ongoing HEP to improve outcomes and carryover.  Baseline:  Goal status: MET 01/13/24  2.  Patient will be able to ambulate 500' with normalized gait pattern and good safety to access community.  Baseline: using rollator and some short distances without, decreased hip flexion on R LE Goal status: MET 4/47/25    3.  Patient will be able to step up/down a flight of steps to safely to access apartment  Baseline: was using stairs in apt prior to stroke Goal status: MET 01/26/24  4.  Patient will demonstrate improved functional LE strength as demonstrated by 5xSTS <14s from chair height. Baseline: 19.81s Goal status: MET 01/26/24  5.  Patient will score 53 on Berg Balance test to demonstrate lower risk of falls. (MCID= 8 points) Baseline: 45 Goal status: MET 01/13/24 54  ASSESSMENT:  CLINICAL IMPRESSION:  Pt arrives requesting DC today- updated goals yet unmet, and she really has made excellent progress and achieved all functional goals in PT at this time. In past sessions we have replicated return to work based tasks in PT and she did very well. Appropriate for DC at this time, thank you for the referral!    EVAL: Patient is a 78 y.o. female who was seen today for physical therapy evaluation and treatment for CVA on 10/24/23. Currently she is ambulating with a rollator and doing some walking without an AD safely around her apartment. She walks with decreased hip flexion and foot clearance on her RLE during swing phase of gait. With functional tests done today she presents as  moderate fall risk. Patient will benefit from skilled PT to address her balance, gait, and strength deficits to return to PLOF, be able to drive, and go back to work at [employer].    OBJECTIVE IMPAIRMENTS: Abnormal gait, decreased balance, decreased coordination, decreased endurance, difficulty walking, and decreased strength.   ACTIVITY LIMITATIONS: stairs, transfers, and locomotion level  PARTICIPATION LIMITATIONS: driving, shopping, community activity, and occupation  PERSONAL FACTORS: Transportation are also affecting patient's functional outcome.   REHAB POTENTIAL: Good  CLINICAL DECISION MAKING: Stable/uncomplicated  EVALUATION COMPLEXITY: Low  PLAN:  PT FREQUENCY: 2x/week  PT DURATION: 12 weeks  PLANNED INTERVENTIONS: 97110-Therapeutic exercises, 97530- Therapeutic activity, 97112- Neuromuscular re-education, 97535- Self Care, 78295- Manual therapy, [id number]- Gait training, Patient/Family education, Balance training, and Stair training  PLAN FOR NEXT SESSION: DC today, thank you for the referral!   Nedra Hai, PT, DPT 01/26/24 2:25 PM

## 2024-01-28 ENCOUNTER — Ambulatory Visit: Admitting: Physical Therapy

## 2024-01-28 ENCOUNTER — Ambulatory Visit: Admitting: Occupational Therapy

## 2024-02-02 ENCOUNTER — Ambulatory Visit: Admitting: Occupational Therapy

## 2024-02-02 ENCOUNTER — Ambulatory Visit

## 2024-02-03 ENCOUNTER — Other Ambulatory Visit (HOSPITAL_COMMUNITY): Payer: Self-pay

## 2024-02-03 ENCOUNTER — Telehealth: Payer: Self-pay

## 2024-02-03 NOTE — Patient Outreach (Signed)
 Telephone outreach to patient to obtain mRS was successfully completed. MRS= 1  Vanice Sarah [facility] Health Care Management Assistant  Direct Dial: [phone]  Fax: [phone] Website: [email or web address]

## 2024-02-05 ENCOUNTER — Ambulatory Visit: Admitting: Occupational Therapy

## 2024-02-05 ENCOUNTER — Ambulatory Visit

## 2024-02-09 ENCOUNTER — Ambulatory Visit: Admitting: Physical Therapy

## 2024-02-09 ENCOUNTER — Ambulatory Visit: Admitting: Occupational Therapy

## 2024-02-12 ENCOUNTER — Ambulatory Visit: Admitting: Occupational Therapy

## 2024-02-12 ENCOUNTER — Ambulatory Visit

## 2024-03-30 ENCOUNTER — Ambulatory Visit: Admitting: Physical Medicine and Rehabilitation

## 2024-04-12 ENCOUNTER — Other Ambulatory Visit (HOSPITAL_BASED_OUTPATIENT_CLINIC_OR_DEPARTMENT_OTHER): Payer: Self-pay

## 2024-04-15 ENCOUNTER — Other Ambulatory Visit (HOSPITAL_BASED_OUTPATIENT_CLINIC_OR_DEPARTMENT_OTHER): Payer: Self-pay

## 2024-04-15 MED ORDER — PRAVASTATIN SODIUM 40 MG PO TABS
40.0000 mg | ORAL_TABLET | Freq: Every day | ORAL | 3 refills | Status: AC
  Filled 2024-04-15 – 2024-04-16 (×2): qty 90, 90d supply, fill #0
  Filled 2024-06-28: qty 90, 90d supply, fill #1
  Filled 2024-09-26: qty 90, 90d supply, fill #2

## 2024-04-16 ENCOUNTER — Other Ambulatory Visit (HOSPITAL_BASED_OUTPATIENT_CLINIC_OR_DEPARTMENT_OTHER): Payer: Self-pay

## 2024-04-16 ENCOUNTER — Other Ambulatory Visit (HOSPITAL_COMMUNITY): Payer: Self-pay

## 2024-04-17 ENCOUNTER — Other Ambulatory Visit (HOSPITAL_COMMUNITY): Payer: Self-pay

## 2024-04-19 ENCOUNTER — Other Ambulatory Visit (HOSPITAL_BASED_OUTPATIENT_CLINIC_OR_DEPARTMENT_OTHER): Payer: Self-pay

## 2024-04-19 ENCOUNTER — Other Ambulatory Visit (HOSPITAL_COMMUNITY): Payer: Self-pay

## 2024-05-03 ENCOUNTER — Ambulatory Visit: Admitting: Physical Medicine and Rehabilitation

## 2024-06-24 ENCOUNTER — Other Ambulatory Visit (HOSPITAL_COMMUNITY): Payer: Self-pay

## 2024-06-24 ENCOUNTER — Encounter: Payer: Self-pay | Admitting: Neurology

## 2024-06-24 ENCOUNTER — Other Ambulatory Visit (HOSPITAL_BASED_OUTPATIENT_CLINIC_OR_DEPARTMENT_OTHER): Payer: Self-pay

## 2024-06-25 ENCOUNTER — Other Ambulatory Visit (HOSPITAL_COMMUNITY): Payer: Self-pay

## 2024-06-25 ENCOUNTER — Other Ambulatory Visit (HOSPITAL_BASED_OUTPATIENT_CLINIC_OR_DEPARTMENT_OTHER): Payer: Self-pay

## 2024-06-28 ENCOUNTER — Other Ambulatory Visit: Payer: Self-pay

## 2024-06-28 ENCOUNTER — Other Ambulatory Visit (HOSPITAL_COMMUNITY): Payer: Self-pay

## 2024-06-28 MED ORDER — EZETIMIBE 10 MG PO TABS
10.0000 mg | ORAL_TABLET | Freq: Every day | ORAL | 3 refills | Status: AC
  Filled 2024-11-08: qty 90, 90d supply, fill #0

## 2024-06-29 ENCOUNTER — Other Ambulatory Visit (HOSPITAL_COMMUNITY): Payer: Self-pay

## 2024-06-30 ENCOUNTER — Other Ambulatory Visit (HOSPITAL_COMMUNITY): Payer: Self-pay

## 2024-07-15 ENCOUNTER — Other Ambulatory Visit (HOSPITAL_BASED_OUTPATIENT_CLINIC_OR_DEPARTMENT_OTHER): Payer: Self-pay

## 2024-07-15 ENCOUNTER — Encounter: Payer: Self-pay | Admitting: Neurology

## 2024-07-16 ENCOUNTER — Other Ambulatory Visit (HOSPITAL_COMMUNITY): Payer: Self-pay

## 2024-07-16 ENCOUNTER — Encounter: Payer: Self-pay | Admitting: Neurology

## 2024-07-16 ENCOUNTER — Other Ambulatory Visit: Payer: Self-pay

## 2024-08-16 ENCOUNTER — Other Ambulatory Visit (HOSPITAL_COMMUNITY): Payer: Self-pay

## 2024-08-30 ENCOUNTER — Other Ambulatory Visit (HOSPITAL_COMMUNITY): Payer: Self-pay

## 2024-09-25 ENCOUNTER — Other Ambulatory Visit (HOSPITAL_COMMUNITY): Payer: Self-pay

## 2024-09-25 ENCOUNTER — Encounter: Payer: Self-pay | Admitting: Neurology

## 2024-09-26 ENCOUNTER — Other Ambulatory Visit (HOSPITAL_COMMUNITY): Payer: Self-pay

## 2024-09-27 ENCOUNTER — Other Ambulatory Visit (HOSPITAL_COMMUNITY): Payer: Self-pay

## 2024-09-28 ENCOUNTER — Other Ambulatory Visit (HOSPITAL_COMMUNITY): Payer: Self-pay

## 2024-11-08 ENCOUNTER — Other Ambulatory Visit: Payer: Self-pay

## 2024-11-08 ENCOUNTER — Other Ambulatory Visit (HOSPITAL_COMMUNITY): Payer: Self-pay
